# Patient Record
Sex: Female | Born: 1960 | Race: White | Hispanic: No | Marital: Married | Smoking: Never smoker
Health system: Southern US, Community
[De-identification: ages and names within clinical notes are randomized; demographics above are authoritative.]

## PROBLEM LIST (undated history)

## (undated) DIAGNOSIS — M549 Dorsalgia, unspecified: Secondary | ICD-10-CM

## (undated) DIAGNOSIS — Z973 Presence of spectacles and contact lenses: Secondary | ICD-10-CM

## (undated) DIAGNOSIS — N200 Calculus of kidney: Secondary | ICD-10-CM

## (undated) DIAGNOSIS — F419 Anxiety disorder, unspecified: Secondary | ICD-10-CM

## (undated) DIAGNOSIS — N201 Calculus of ureter: Secondary | ICD-10-CM

## (undated) DIAGNOSIS — Z8679 Personal history of other diseases of the circulatory system: Secondary | ICD-10-CM

## (undated) DIAGNOSIS — K219 Gastro-esophageal reflux disease without esophagitis: Secondary | ICD-10-CM

## (undated) DIAGNOSIS — M1990 Unspecified osteoarthritis, unspecified site: Secondary | ICD-10-CM

## (undated) DIAGNOSIS — M069 Rheumatoid arthritis, unspecified: Secondary | ICD-10-CM

## (undated) DIAGNOSIS — S52501A Unspecified fracture of the lower end of right radius, initial encounter for closed fracture: Secondary | ICD-10-CM

## (undated) DIAGNOSIS — G8929 Other chronic pain: Secondary | ICD-10-CM

## (undated) DIAGNOSIS — Z87442 Personal history of urinary calculi: Secondary | ICD-10-CM

## (undated) HISTORY — DX: Calculus of kidney: N20.0

## (undated) HISTORY — PX: WISDOM TOOTH EXTRACTION: SHX21

---

## 2000-03-18 ENCOUNTER — Other Ambulatory Visit: Admission: RE | Admit: 2000-03-18 | Discharge: 2000-03-18 | Payer: Self-pay | Admitting: Obstetrics and Gynecology

## 2001-07-02 ENCOUNTER — Other Ambulatory Visit: Admission: RE | Admit: 2001-07-02 | Discharge: 2001-07-02 | Payer: Self-pay | Admitting: Obstetrics and Gynecology

## 2001-11-24 ENCOUNTER — Ambulatory Visit (HOSPITAL_COMMUNITY): Admission: RE | Admit: 2001-11-24 | Discharge: 2001-11-24 | Payer: Self-pay | Admitting: Family Medicine

## 2001-11-24 ENCOUNTER — Encounter: Payer: Self-pay | Admitting: Family Medicine

## 2002-07-30 ENCOUNTER — Other Ambulatory Visit: Admission: RE | Admit: 2002-07-30 | Discharge: 2002-07-30 | Payer: Self-pay | Admitting: Obstetrics and Gynecology

## 2003-08-19 ENCOUNTER — Other Ambulatory Visit: Admission: RE | Admit: 2003-08-19 | Discharge: 2003-08-19 | Payer: Self-pay | Admitting: Obstetrics and Gynecology

## 2004-10-16 ENCOUNTER — Other Ambulatory Visit: Admission: RE | Admit: 2004-10-16 | Discharge: 2004-10-16 | Payer: Self-pay | Admitting: Obstetrics and Gynecology

## 2005-12-31 ENCOUNTER — Other Ambulatory Visit: Admission: RE | Admit: 2005-12-31 | Discharge: 2005-12-31 | Payer: Self-pay | Admitting: Obstetrics and Gynecology

## 2007-09-18 HISTORY — PX: KNEE ARTHROSCOPY: SUR90

## 2008-05-04 ENCOUNTER — Ambulatory Visit: Payer: Self-pay | Admitting: Family Medicine

## 2008-05-04 DIAGNOSIS — R109 Unspecified abdominal pain: Secondary | ICD-10-CM

## 2008-05-04 DIAGNOSIS — N200 Calculus of kidney: Secondary | ICD-10-CM

## 2008-05-04 LAB — CONVERTED CEMR LAB: Bacteria, UA: 0

## 2008-05-06 ENCOUNTER — Telehealth (INDEPENDENT_AMBULATORY_CARE_PROVIDER_SITE_OTHER): Payer: Self-pay | Admitting: Internal Medicine

## 2008-05-07 ENCOUNTER — Observation Stay (HOSPITAL_COMMUNITY): Admission: EM | Admit: 2008-05-07 | Discharge: 2008-05-08 | Payer: Self-pay | Admitting: Emergency Medicine

## 2008-05-10 ENCOUNTER — Observation Stay (HOSPITAL_COMMUNITY): Admission: EM | Admit: 2008-05-10 | Discharge: 2008-05-11 | Payer: Self-pay | Admitting: Emergency Medicine

## 2008-05-10 ENCOUNTER — Ambulatory Visit (HOSPITAL_COMMUNITY): Admission: RE | Admit: 2008-05-10 | Discharge: 2008-05-10 | Payer: Self-pay | Admitting: Urology

## 2008-05-10 HISTORY — PX: EXTRACORPOREAL SHOCK WAVE LITHOTRIPSY: SHX1557

## 2008-05-10 HISTORY — PX: OTHER SURGICAL HISTORY: SHX169

## 2010-09-17 HISTORY — PX: COLONOSCOPY: SHX174

## 2010-10-12 ENCOUNTER — Ambulatory Visit (HOSPITAL_COMMUNITY)
Admission: RE | Admit: 2010-10-12 | Discharge: 2010-10-12 | Payer: Self-pay | Source: Home / Self Care | Attending: Obstetrics and Gynecology | Admitting: Obstetrics and Gynecology

## 2011-01-30 NOTE — Op Note (Signed)
NAMEKERRYN, TENNANT              ACCOUNT NO.:  [id number]   MEDICAL RECORD NO.:  [id number]          PATIENT TYPE:  INP   LOCATION:  0098                         FACILITY:  [facility]   PHYSICIAN:  Excell Seltzer. Annabell Howells, M.D.    DATE OF BIRTH:  [birth year]   DATE OF PROCEDURE:  05/10/2008  DATE OF DISCHARGE:                               OPERATIVE REPORT   PROCEDURE:  Cystoscopy and insertion of right double-J stent.   PREOPERATIVE DIAGNOSIS:  A right proximal ureteral stone with  obstructing fragment post lithotripsy with fever.   POSTOPERATIVE DIAGNOSIS:  A right proximal ureteral stone with  obstructing fragment post lithotripsy with fever.   SURGEON:  Excell Seltzer. Annabell Howells, MD.   ANESTHESIA:  General.   DRAIN:  A 6-French x 26 mm double-J stent.   COMPLICATIONS:  None.   INDICATIONS:  Ms. Minchew is a 50 year old white female, who underwent  lithotripsy earlier today for a 6 mm right proximal ureteral stone.  Postoperatively, she developed severe right flank pain with a fever of  100.8 despite perioperative Cipro.  She got relief from Dilaudid in the  emergency room, but a CT scan to rule out hematoma demonstrated marked  hydronephrosis with an obstructing proximal fragment and after reviewing  the options we elected to proceed with placement of a stent particularly  because she had had the fever earlier, although she was afebrile upon  arrival to the emergency room.   FINDINGS/PROCEDURE:  The patient was taken to the operating room after  receiving Cipro.  She was placed in lithotomy position.  Her perineum  and genitalia were prepped with Betadine solution, and she was draped in  the usual sterile fashion.  Cystoscopy was performed using the 22-French  scope and 12-degree lens.  Examination revealed a normal urethra, the  bladder wall was unremarkable, the ureteral orifices were unremarkable.  There was a small stone fragment in the bladder suggesting at least  partial  fragmentation.   The right ureteral orifice was cannulated with a Sensor guidewire, which  was passed easily to the kidney.  Some bloody urine effluxed along the  bladder when it was passed.  A 6-French, 26-cm, double-J stent was then  passed through the kidney under fluoroscopic guidance.  Removal of the  wire produced a good coil in the kidney and a good coil in the bladder  with continued drainage of bloody turbid urine from the kidney.  The  bladder was then  drained, the cystoscope was removed, a B&O suppository was placed, the  patient was taken down from lithotomy position, her anesthetic was  reversed, and she was moved to the recovery room in stable condition.  There were no complications.      Excell Seltzer. Annabell Howells, M.D.  Electronically Signed     JJW/MEDQ  D:  05/10/2008  T:  05/11/2008  Job:  [id number]

## 2012-06-18 ENCOUNTER — Other Ambulatory Visit: Payer: Self-pay | Admitting: Obstetrics and Gynecology

## 2012-06-18 DIAGNOSIS — R928 Other abnormal and inconclusive findings on diagnostic imaging of breast: Secondary | ICD-10-CM

## 2012-06-18 DIAGNOSIS — N63 Unspecified lump in unspecified breast: Secondary | ICD-10-CM

## 2012-06-23 ENCOUNTER — Ambulatory Visit
Admission: RE | Admit: 2012-06-23 | Discharge: 2012-06-23 | Disposition: A | Discharge status: Home / Self Care | Payer: 59 | Source: Ambulatory Visit | Attending: Obstetrics and Gynecology | Admitting: Obstetrics and Gynecology

## 2012-06-23 DIAGNOSIS — N63 Unspecified lump in unspecified breast: Secondary | ICD-10-CM

## 2012-06-23 DIAGNOSIS — R928 Other abnormal and inconclusive findings on diagnostic imaging of breast: Secondary | ICD-10-CM

## 2013-07-30 ENCOUNTER — Ambulatory Visit: Payer: Self-pay | Admitting: Otolaryngology

## 2013-09-25 ENCOUNTER — Other Ambulatory Visit: Payer: Self-pay

## 2013-09-25 DIAGNOSIS — Z803 Family history of malignant neoplasm of breast: Secondary | ICD-10-CM

## 2013-09-25 DIAGNOSIS — Z1231 Encounter for screening mammogram for malignant neoplasm of breast: Secondary | ICD-10-CM

## 2013-10-15 ENCOUNTER — Ambulatory Visit
Admission: RE | Admit: 2013-10-15 | Discharge: 2013-10-15 | Disposition: A | Discharge status: Home / Self Care | Payer: Self-pay | Source: Ambulatory Visit

## 2013-10-15 DIAGNOSIS — Z1231 Encounter for screening mammogram for malignant neoplasm of breast: Secondary | ICD-10-CM

## 2013-10-15 DIAGNOSIS — Z803 Family history of malignant neoplasm of breast: Secondary | ICD-10-CM

## 2014-04-27 ENCOUNTER — Other Ambulatory Visit: Payer: Self-pay | Admitting: Urology

## 2014-05-26 ENCOUNTER — Encounter (HOSPITAL_COMMUNITY): Payer: Self-pay | Admitting: Pharmacy Technician

## 2014-06-01 ENCOUNTER — Encounter (HOSPITAL_BASED_OUTPATIENT_CLINIC_OR_DEPARTMENT_OTHER): Payer: Self-pay | Admitting: *Deleted

## 2014-06-07 ENCOUNTER — Encounter (HOSPITAL_BASED_OUTPATIENT_CLINIC_OR_DEPARTMENT_OTHER): Payer: Self-pay | Admitting: *Deleted

## 2014-06-07 ENCOUNTER — Encounter (HOSPITAL_COMMUNITY): Payer: Self-pay | Admitting: General Practice

## 2014-06-07 NOTE — Progress Notes (Signed)
NPO AFTER MN. ARRIVE AT 0900. NEEDS HG. WILL TAKE CYMBALTA AM DSO W/ SIPS OF WATER.

## 2014-06-09 ENCOUNTER — Ambulatory Visit (HOSPITAL_BASED_OUTPATIENT_CLINIC_OR_DEPARTMENT_OTHER): Payer: 59 | Admitting: Anesthesiology

## 2014-06-09 ENCOUNTER — Encounter (HOSPITAL_BASED_OUTPATIENT_CLINIC_OR_DEPARTMENT_OTHER): Payer: 59 | Admitting: Anesthesiology

## 2014-06-09 ENCOUNTER — Encounter (HOSPITAL_BASED_OUTPATIENT_CLINIC_OR_DEPARTMENT_OTHER)
Admission: RE | Disposition: A | Discharge status: Home / Self Care | Payer: Self-pay | Source: Ambulatory Visit | Attending: Urology

## 2014-06-09 ENCOUNTER — Ambulatory Visit (HOSPITAL_BASED_OUTPATIENT_CLINIC_OR_DEPARTMENT_OTHER)
Admission: RE | Admit: 2014-06-09 | Discharge: 2014-06-09 | Disposition: A | Discharge status: Home / Self Care | Payer: 59 | Source: Ambulatory Visit | Attending: Urology | Admitting: Urology

## 2014-06-09 ENCOUNTER — Encounter (HOSPITAL_BASED_OUTPATIENT_CLINIC_OR_DEPARTMENT_OTHER): Payer: Self-pay | Admitting: Anesthesiology

## 2014-06-09 DIAGNOSIS — R011 Cardiac murmur, unspecified: Secondary | ICD-10-CM | POA: Diagnosis not present

## 2014-06-09 DIAGNOSIS — M129 Arthropathy, unspecified: Secondary | ICD-10-CM | POA: Diagnosis not present

## 2014-06-09 DIAGNOSIS — R310 Gross hematuria: Secondary | ICD-10-CM | POA: Insufficient documentation

## 2014-06-09 DIAGNOSIS — Z841 Family history of disorders of kidney and ureter: Secondary | ICD-10-CM | POA: Insufficient documentation

## 2014-06-09 DIAGNOSIS — N200 Calculus of kidney: Secondary | ICD-10-CM | POA: Insufficient documentation

## 2014-06-09 HISTORY — DX: Anxiety disorder, unspecified: F41.9

## 2014-06-09 HISTORY — DX: Personal history of urinary calculi: Z87.442

## 2014-06-09 HISTORY — DX: Calculus of kidney: N20.0

## 2014-06-09 HISTORY — DX: Presence of spectacles and contact lenses: Z97.3

## 2014-06-09 HISTORY — DX: Gastro-esophageal reflux disease without esophagitis: K21.9

## 2014-06-09 HISTORY — PX: CYSTOSCOPY W/ URETERAL STENT PLACEMENT: SHX1429

## 2014-06-09 HISTORY — DX: Unspecified osteoarthritis, unspecified site: M19.90

## 2014-06-09 LAB — POCT HEMOGLOBIN-HEMACUE: Hemoglobin: 15 g/dL (ref 12.0–15.0)

## 2014-06-09 SURGERY — CYSTOSCOPY, WITH RETROGRADE PYELOGRAM AND URETERAL STENT INSERTION
Anesthesia: General | Site: Ureter | Laterality: Right

## 2014-06-09 MED ORDER — KETOROLAC TROMETHAMINE 30 MG/ML IJ SOLN
15.0000 mg | Freq: Once | INTRAMUSCULAR | Status: DC | PRN
  Filled 2014-06-09: qty 1

## 2014-06-09 MED ORDER — LACTATED RINGERS IV SOLN
INTRAVENOUS | Status: DC
Start: 2014-06-09 — End: 2014-06-09
  Administered 2014-06-09 (×2): via INTRAVENOUS
  Filled 2014-06-09: qty 1000

## 2014-06-09 MED ORDER — MIDAZOLAM HCL 5 MG/5ML IJ SOLN
INTRAMUSCULAR | Status: DC | PRN
  Administered 2014-06-09: 2 mg via INTRAVENOUS

## 2014-06-09 MED ORDER — FENTANYL CITRATE 0.05 MG/ML IJ SOLN
INTRAMUSCULAR | Status: DC | PRN
  Administered 2014-06-09: 50 ug via INTRAVENOUS
  Administered 2014-06-09 (×3): 25 ug via INTRAVENOUS

## 2014-06-09 MED ORDER — DEXAMETHASONE SODIUM PHOSPHATE 4 MG/ML IJ SOLN
INTRAMUSCULAR | Status: DC | PRN
  Administered 2014-06-09: 8 mg via INTRAVENOUS

## 2014-06-09 MED ORDER — FENTANYL CITRATE 0.05 MG/ML IJ SOLN
INTRAMUSCULAR | Status: AC
  Filled 2014-06-09: qty 2

## 2014-06-09 MED ORDER — SODIUM CHLORIDE 0.9 % IR SOLN
Status: DC | PRN
  Administered 2014-06-09: 3000 mL

## 2014-06-09 MED ORDER — FENTANYL CITRATE 0.05 MG/ML IJ SOLN
INTRAMUSCULAR | Status: AC
  Filled 2014-06-09: qty 4

## 2014-06-09 MED ORDER — LIDOCAINE HCL 2 % EX GEL
CUTANEOUS | Status: DC | PRN
  Administered 2014-06-09: 1 via URETHRAL

## 2014-06-09 MED ORDER — LACTATED RINGERS IV SOLN: INTRAVENOUS | Status: DC | PRN

## 2014-06-09 MED ORDER — OXYCODONE-ACETAMINOPHEN 5-325 MG PO TABS
ORAL_TABLET | ORAL | Status: AC
  Filled 2014-06-09: qty 1

## 2014-06-09 MED ORDER — ONDANSETRON HCL 4 MG/2ML IJ SOLN
INTRAMUSCULAR | Status: DC | PRN
  Administered 2014-06-09: 4 mg via INTRAVENOUS

## 2014-06-09 MED ORDER — FENTANYL CITRATE 0.05 MG/ML IJ SOLN
25.0000 ug | INTRAMUSCULAR | Status: DC | PRN
  Administered 2014-06-09: 50 ug via INTRAVENOUS
  Filled 2014-06-09: qty 1

## 2014-06-09 MED ORDER — PROMETHAZINE HCL 25 MG/ML IJ SOLN
6.2500 mg | INTRAMUSCULAR | Status: DC | PRN
  Filled 2014-06-09: qty 1

## 2014-06-09 MED ORDER — CEFAZOLIN SODIUM-DEXTROSE 2-3 GM-% IV SOLR
2.0000 g | INTRAVENOUS | Status: AC
  Administered 2014-06-09: 2 g via INTRAVENOUS
  Filled 2014-06-09: qty 50

## 2014-06-09 MED ORDER — PROPOFOL 10 MG/ML IV BOLUS
INTRAVENOUS | Status: DC | PRN
  Administered 2014-06-09: 200 mg via INTRAVENOUS

## 2014-06-09 MED ORDER — IOHEXOL 350 MG/ML SOLN
INTRAVENOUS | Status: DC | PRN
Start: 2014-06-09 — End: 2014-06-09
  Administered 2014-06-09: 9 mL via URETHRAL

## 2014-06-09 MED ORDER — OXYCODONE-ACETAMINOPHEN 5-325 MG PO TABS
1.0000 | ORAL_TABLET | ORAL | Status: DC | PRN
  Administered 2014-06-09: 1 via ORAL
  Filled 2014-06-09: qty 2

## 2014-06-09 MED ORDER — LIDOCAINE HCL (CARDIAC) 20 MG/ML IV SOLN
INTRAVENOUS | Status: DC | PRN
  Administered 2014-06-09: 50 mg via INTRAVENOUS

## 2014-06-09 MED ORDER — KETOROLAC TROMETHAMINE 30 MG/ML IJ SOLN
INTRAMUSCULAR | Status: DC | PRN
  Administered 2014-06-09: 30 mg via INTRAVENOUS

## 2014-06-09 MED ORDER — MIDAZOLAM HCL 2 MG/2ML IJ SOLN
INTRAMUSCULAR | Status: AC
  Filled 2014-06-09: qty 2

## 2014-06-09 SURGICAL SUPPLY — 30 items
ADAPTER CATH URET PLST 4-6FR (CATHETERS) IMPLANT
ADPR CATH URET STRL DISP 4-6FR (CATHETERS)
APL SKNCLS STERI-STRIP NONHPOA (GAUZE/BANDAGES/DRESSINGS)
BAG DRAIN URO-CYSTO SKYTR STRL (DRAIN) ×3 IMPLANT
BAG DRN UROCATH (DRAIN) ×1
BENZOIN TINCTURE PRP APPL 2/3 (GAUZE/BANDAGES/DRESSINGS) IMPLANT
CANISTER SUCT LVC 12 LTR MEDI- (MISCELLANEOUS) ×2 IMPLANT
CATH INTERMIT  6FR 70CM (CATHETERS) ×2 IMPLANT
CATH URET 5FR 28IN CONE TIP (BALLOONS)
CATH URET 5FR 28IN OPEN ENDED (CATHETERS) IMPLANT
CATH URET 5FR 70CM CONE TIP (BALLOONS) IMPLANT
CLOTH BEACON ORANGE TIMEOUT ST (SAFETY) ×3 IMPLANT
DRAPE CAMERA CLOSED 9X96 (DRAPES) ×3 IMPLANT
DRSG TEGADERM 2-3/8X2-3/4 SM (GAUZE/BANDAGES/DRESSINGS) IMPLANT
GLOVE BIO SURGEON STRL SZ7.5 (GLOVE) ×3 IMPLANT
GLOVE SURG SS PI 7.5 STRL IVOR (GLOVE) ×4 IMPLANT
GOWN BRE IMP SLV AUR LG STRL (GOWN DISPOSABLE) ×4 IMPLANT
GOWN STRL REIN XL XLG (GOWN DISPOSABLE) ×1 IMPLANT
GUIDEWIRE 0.038 PTFE COATED (WIRE) IMPLANT
GUIDEWIRE ANG ZIPWIRE 038X150 (WIRE) IMPLANT
GUIDEWIRE STR DUAL SENSOR (WIRE) ×2 IMPLANT
KIT BALLIN UROMAX 15FX10 (LABEL) IMPLANT
KIT BALLN UROMAX 15FX4 (MISCELLANEOUS) IMPLANT
KIT BALLN UROMAX 26 75X4 (MISCELLANEOUS)
NS IRRIG 500ML POUR BTL (IV SOLUTION) IMPLANT
PACK CYSTO (CUSTOM PROCEDURE TRAY) ×3 IMPLANT
SET HIGH PRES BAL DIL (LABEL)
SHEATH URET ACCESS 12FR/35CM (UROLOGICAL SUPPLIES) IMPLANT
SHEATH URET ACCESS 12FR/55CM (UROLOGICAL SUPPLIES) IMPLANT
STENT URET 6FRX24 CONTOUR (STENTS) ×2 IMPLANT

## 2014-06-09 NOTE — Anesthesia Preprocedure Evaluation (Signed)
Anesthesia Evaluation  Patient identified by MRN, date of birth, ID band Patient awake    Reviewed: Allergy & Precautions, H&P , NPO status , Patient's Chart, lab work & pertinent test results  Airway Mallampati: II TM Distance: >3 FB Neck ROM: Full    Dental no notable dental hx.    Pulmonary neg pulmonary ROS,  breath sounds clear to auscultation  Pulmonary exam normal       Cardiovascular negative cardio ROS  Rhythm:Regular Rate:Normal     Neuro/Psych Depression negative neurological ROS     GI/Hepatic negative GI ROS, Neg liver ROS,   Endo/Other  negative endocrine ROS  Renal/GU negative Renal ROS  negative genitourinary   Musculoskeletal negative musculoskeletal ROS (+)   Abdominal   Peds negative pediatric ROS (+)  Hematology negative hematology ROS (+)   Anesthesia Other Findings   Reproductive/Obstetrics negative OB ROS                           Anesthesia Physical Anesthesia Plan  ASA: II  Anesthesia Plan: General   Post-op Pain Management:    Induction: Intravenous  Airway Management Planned: LMA  Additional Equipment:   Intra-op Plan:   Post-operative Plan:   Informed Consent: I have reviewed the patients History and Physical, chart, labs and discussed the procedure including the risks, benefits and alternatives for the proposed anesthesia with the patient or authorized representative who has indicated his/her understanding and acceptance.   Dental advisory given  Plan Discussed with: CRNA and Surgeon  Anesthesia Plan Comments:         Anesthesia Quick Evaluation

## 2014-06-09 NOTE — Anesthesia Procedure Notes (Signed)
Procedure Name: LMA Insertion Date/Time: 06/09/2014 12:45 PM Performed by: Renella Cunas D Pre-anesthesia Checklist: Patient identified, Emergency Drugs available, Suction available and Patient being monitored Patient Re-evaluated:Patient Re-evaluated prior to inductionOxygen Delivery Method: Circle System Utilized Preoxygenation: Pre-oxygenation with 100% oxygen Intubation Type: IV induction Ventilation: Mask ventilation without difficulty LMA: LMA inserted LMA Size: 4.0 Number of attempts: 1 Airway Equipment and Method: bite block Placement Confirmation: positive ETCO2 Tube secured with: Tape Dental Injury: Teeth and Oropharynx as per pre-operative assessment

## 2014-06-09 NOTE — Interval H&P Note (Signed)
History and Physical Interval Note:  06/09/2014 12:40 PM  Courtney Kramer  has presented today for surgery, with the diagnosis of RIGHT RENAL CALCULUS  The various methods of treatment have been discussed with the patient and family. After consideration of risks, benefits and other options for treatment, the patient has consented to  Procedure(s): CYSTOSCOPY WITH RETROGRADE PYELOGRAM/URETERAL STENT PLACEMENT (Right) as a surgical intervention .  The patient's history has been reviewed, patient examined, no change in status, stable for surgery.  I have reviewed the patient's chart and labs.  Questions were answered to the patient's satisfaction.     Violia Knopf S

## 2014-06-09 NOTE — Anesthesia Postprocedure Evaluation (Signed)
  Anesthesia Post-op Note  Patient: Courtney Kramer  Procedure(s) Performed: Procedure(s) (LRB): CYSTOSCOPY WITH RETROGRADE PYELOGRAM/URETERAL STENT PLACEMENT (Right)  Patient Location: PACU  Anesthesia Type: General  Level of Consciousness: awake and alert   Airway and Oxygen Therapy: Patient Spontanous Breathing  Post-op Pain: mild  Post-op Assessment: Post-op Vital signs reviewed, Patient's Cardiovascular Status Stable, Respiratory Function Stable, Patent Airway and No signs of Nausea or vomiting  Last Vitals:  Filed Vitals:   06/09/14 1445  BP: 146/81  Pulse: 71  Temp: 36.8 C  Resp: 16    Post-op Vital Signs: stable   Complications: No apparent anesthesia complications

## 2014-06-09 NOTE — H&P (Signed)
History of Present Illness   Courtney Kramer presents today to reestablish as a new patient in our office. We saw her on several occasions 4-6 years ago with an episode of nephrolithiasis. The patient did require lithotripsy in 2009 for a right renal calculus. She was felt to have a small residual 1 mm fragment. After her lithotripsy, she did require a fairly urgent stent placement secondary to some obstructing fragments. The stent was subsequently removed and her situation improved. She also had a small pulmonary nodule that was further assessed and found to be stable. She then did quite well and has not been back to see [place]. For several months now she has had some intermittent right-sided flank and abdominal discomfort. She has also had multiple episodes of her urine being pink. It does appear that she has had some intermittent pain and gross hematuria. She has had no recent imaging studies of her abdomen. Her last episode of pain occurred about a week ago. Her urinalysis today is actually clear.     Past Medical History Problems  1. History of arthritis (V13.4) 2. History of Murmur (785.2) 3. Nephrolithiasis (592.0)  Surgical History Problems  1. History of Knee Surgery 2. History of Lithotripsy  Current Meds 1. No Reported Medications Recorded  Allergies Medication  1. No Known Drug Allergies  Family History Problems  1. Family history of kidney stones (V18.69) : Sibling  Social History Problems    Denied: History of Alcohol Use   Caffeine Use   1- sometimes   Marital History - Currently Married   Never a smoker   Occupation:   hairdresser   Denied: History of Tobacco Use  Review of Systems Genitourinary, constitutional, skin, eye, otolaryngeal, hematologic/lymphatic, cardiovascular, pulmonary, endocrine, musculoskeletal, gastrointestinal, neurological and psychiatric system(s) were reviewed and pertinent findings if present are noted.  Genitourinary: nocturia and  hematuria.  Constitutional: night sweats and feeling tired (fatigue).    Vitals  Height: 5 ft 6 in Weight: 181 lb  BMI Calculated: 29.21 BSA Calculated: 1.92 Blood Pressure: 118 / 74 Temperature: 97 F Heart Rate: 77  Physical Exam Constitutional: Well nourished and well developed . No acute distress.  ENT:. The ears and nose are normal in appearance.  Neck: The appearance of the neck is normal and no neck mass is present.  Pulmonary: No respiratory distress and normal respiratory rhythm and effort.  Cardiovascular: Heart rate and rhythm are normal . No peripheral edema.  Abdomen: The abdomen is soft and nontender. No masses are palpated. No CVA tenderness. No hernias are palpable. No hepatosplenomegaly noted.  Skin: Normal skin turgor, no visible rash and no visible skin lesions.  Neuro/Psych:. Mood and affect are appropriate.    Results/Data Urine  COLOR YELLOW  APPEARANCE CLOUDY  SPECIFIC GRAVITY 1.010  pH 7.5  GLUCOSE NEG mg/dL BILIRUBIN NEG  KETONE NEG mg/dL BLOOD NEG  PROTEIN NEG mg/dL UROBILINOGEN 0.2 mg/dL NITRITE NEG  LEUKOCYTE ESTERASE NEG  SQUAMOUS EPITHELIAL/HPF RARE  WBC NONE SEEN WBC/hpf RBC 0-2 RBC/hpf BACTERIA NONE SEEN  CRYSTALS NONE SEEN  CASTS NONE SEEN  Selected Results  SPECIMEN TYPE: CLEAN CATCH  Test Name Result Flag Reference COLOR YELLOW  YELLOW APPEARANCE CLOUDY A CLEAR SPECIFIC GRAVITY 1.010  1.005-1.030 pH 7.5  5.0-8.0 GLUCOSE NEG mg/dL  NEG BILIRUBIN NEG  NEG KETONE NEG mg/dL  NEG BLOOD NEG  NEG PROTEIN NEG mg/dL  NEG UROBILINOGEN 0.2 mg/dL  3.7-1.0 NITRITE NEG  NEG LEUKOCYTE ESTERASE NEG  NEG SQUAMOUS EPITHELIAL/HPF RARE  RARE  WBC NONE SEEN WBC/hpf  <3 RBC 0-2 RBC/hpf  <3 BACTERIA NONE SEEN  RARE CRYSTALS NONE SEEN  NONE SEEN CASTS NONE SEEN  NONE SEEN  AU CT-STONE PROTOCOL 10Aug2015 12:00AM Courtney Kramer  Test Name Result Flag Reference CT-STONE PROTOCOL (Report)   ** RADIOLOGY REPORT BY [facility] **    CLINICAL DATA: Hematuria with right low back and right lower quadrant pain.  EXAM: CT ABDOMEN AND PELVIS WITHOUT CONTRAST (URINARY CALCULUS PROTOCOL)  TECHNIQUE: Multidetector CT imaging was performed through the abdomen and pelvis without intravenous contrast to include the urinary tract.  COMPARISON: 05/10/2008.  FINDINGS: Lung bases show a 4 mm nodule in the left lower lobe, unchanged and therefore benign. Heart size normal. No pericardial or pleural effusion.  Liver, gallbladder and adrenal glands are unremarkable. Stones are seen in the kidneys bilaterally, right greater than left, measuring up to 9 mm on the right. Ureters are decompressed bilaterally. Spleen, pancreas, stomach, small bowel and appendix are unremarkable. A fair amount of stool is seen in the colon. Uterus and ovaries are visualized. No free fluid. No pathologically enlarged lymph nodes. Scattered atherosclerotic calcification of the arterial vasculature without abdominal aortic aneurysm. No worrisome lytic or sclerotic lesions.  IMPRESSION: 1. No acute findings to explain the patient's given symptoms. 2. Right renal stones. 3. Probable constipation.   Electronically Signed  By: Courtney Kramer M.D.  On: 04/26/2014 15:58  Assessment Assessed  1. Gross hematuria (599.71) 2. Nephrolithiasis (592.0)  Plan Health Maintenance  1. UA With REFLEX; [Do Not Release]; Status:Complete;   Done: 10Aug2015 02:46PM Nephrolithiasis  2. AU CT-STONE PROTOCOL; Status:Complete;   Done: 10Aug2015 12:00AM  Discussion/Summary   Courtney Kramer has had some nonspecific right-sided back and abdominal pain. CT today shows a 9 mm stone in her right kidney. It is currently nonobstructing but could be potentially obstructing a portion of a calyx. She has also had some intermittent hematuria and that is likely to be secondary to the stone. Clearly, this is a substantial change from the 1 mm stone she had 4 years ago. She does have a  couple of tiny stones also in that right kidney, but the significant stone is the single 9 mm stone. I do think this would be a stone that we would typically recommend treatment given her metabolic activity and the fact that she is having some intermittent hematuria. Last time she had ESWL, she had obstructing fragments and required an urgent stent. I cannot rule out that possibility of occurring again. She does have an option of having a stent placed prior to litho, but has may indeed get away without a stent this time, so that is ultimately her decision. She is told that typically in a situation like this, we would not pre-place the stent and would do lithotripsy and then see how things go. Hounsfield units on the stone today were around 900.   cc: Tomi Bamberger, NP

## 2014-06-09 NOTE — Discharge Instructions (Addendum)
Cystoscopy patient instructions  Following a cystoscopy, a catheter (a flexible rubber tube) is sometimes left in place to empty the bladder. This may cause some discomfort or a feeling that you need to urinate. Your doctor determines the period of time that the catheter will be left in place. You may have bloody urine for two to three days (Call your doctor if the amount of bleeding increases or does not subside).  You may pass blood clots in your urine, especially if you had a biopsy. It is not unusual to pass small blood clots and have some bloody urine a couple of weeks after your cystoscopy. Again, call your doctor if the bleeding does not subside. You may have: Dysuria (painful urination) Frequency (urinating often) Urgency (strong desire to urinate)  These symptoms are common especially if medicine is instilled into the bladder or a ureteral stent is placed. Avoiding alcohol and caffeine, such as coffee, tea, and chocolate, may help relieve these symptoms. Drink plenty of water, unless otherwise instructed. Your doctor may also prescribe an antibiotic or other medicine to reduce these symptoms.  Cystoscopy results are available soon after the procedure; biopsy results usually take two to four days. Your doctor will discuss the results of your exam with you. Before you go home, you will be given specific instructions for follow-up care. Special Instructions:  1 If you are going home with a catheter in place do not take a tub bath until removed by your doctor.  2 You may resume your normal activities.  3 Do not drive or operate machinery if you are taking narcotic pain medicine.  4 Be sure to keep all follow-up appointments with your doctor.   5 Call Your Doctor If: The catheter is not draining  You have severe pain  You are unable to urinate  You have a fever over 101  You have severe bleeding           keep appointment for tomorrow's ESWL   Post Anesthesia Home Care  Instructions  Activity: Get plenty of rest for the remainder of the day. A responsible adult should stay with you for 24 hours following the procedure.  For the next 24 hours, DO NOT: -Drive a car -Advertising copywriter -Drink alcoholic beverages -Take any medication unless instructed by your physician -Make any legal decisions or sign important papers.  Meals: Start with liquid foods such as gelatin or soup. Progress to regular foods as tolerated. Avoid greasy, spicy, heavy foods. If nausea and/or vomiting occur, drink only clear liquids until the nausea and/or vomiting subsides. Call your physician if vomiting continues.  Special Instructions/Symptoms: Your throat may feel dry or sore from the anesthesia or the breathing tube placed in your throat during surgery. If this causes discomfort, gargle with warm salt water. The discomfort should disappear within 24 hours.

## 2014-06-09 NOTE — Transfer of Care (Signed)
Immediate Anesthesia Transfer of Care Note  Patient: Courtney Kramer  Procedure(s) Performed: Procedure(s) (LRB): CYSTOSCOPY WITH RETROGRADE PYELOGRAM/URETERAL STENT PLACEMENT (Right)  Patient Location: PACU  Anesthesia Type: General  Level of Consciousness: awake, oriented, sedated and patient cooperative  Airway & Oxygen Therapy: Patient Spontanous Breathing and Patient connected to face mask oxygen  Post-op Assessment: Report given to PACU RN and Post -op Vital signs reviewed and stable  Post vital signs: Reviewed and stable  Complications: No apparent anesthesia complications

## 2014-06-09 NOTE — Op Note (Signed)
Preoperative diagnosis: Right renal calculus Postoperative diagnosis: Same  Procedure: Cystoscopy, right retrograde pyelogram with fluoroscopic interpretation, right double-J stent placement 6 French x24 cm   Surgeon: Valetta Fuller M.D.  Anesthesia: Gen.  Indications: Patient has a history of nephrolithiasis. She recently presented for further assessment of some intermittent right flank pain. She is also had some intermittent gross hematuria. CT scan showed multiple bilateral nonobstructing renal stones. The largest stone at 9 mm on the right side was noted in a lower pole calyx we thought that stone might be intermittently obstructing her calyx and/or resulting hematuria. She elected to proceed with definitive treatment of the stone. We suggested ESWL but she developed obstructing fragments last time and required an urgent stent. She requested pre-ESWL stent placement.     Technique and findings: Patient was brought the operating room where she had successful induction general anesthesia. She was placed in lithotomy position and prepped and draped in usual manner. Appropriate surgical timeout was performed. Cystoscopy revealed unremarkable urethra and bladder.   The right ureteral orifice was cannulated with an open-ended catheter. Retrograde pyelogram was done with fluoroscopic interpretation. The ureter was out filling defects or obstruction. A stone could be appreciated in the lower pole the right kidney. There was no evidence of hydronephrosis.   Guidewire was placed in the right renal pelvis. The cervix French 24 cm double-J stent was placed with fluoroscopic as well as visual guidance. No obvious complications occurred. Patient was brought to recovery room stable condition.

## 2014-06-09 NOTE — Anesthesia Postprocedure Evaluation (Deleted)
  Anesthesia Post-op Note  Patient: Courtney Kramer  Procedure(s) Performed: Procedure(s) (LRB): CYSTOSCOPY WITH RETROGRADE PYELOGRAM/URETERAL STENT PLACEMENT (Right)  Patient Location: PACU  Anesthesia Type: General  Level of Consciousness: awake and alert   Airway and Oxygen Therapy: Patient Spontanous Breathing  Post-op Pain: mild  Post-op Assessment: Post-op Vital signs reviewed, Patient's Cardiovascular Status Stable, Respiratory Function Stable, Patent Airway and No signs of Nausea or vomiting  Last Vitals:  Filed Vitals:   06/09/14 1445  BP: 146/81  Pulse: 71  Temp: 36.8 C  Resp: 16    Post-op Vital Signs: stable   Complications: No apparent anesthesia complications  

## 2014-06-10 ENCOUNTER — Other Ambulatory Visit: Payer: Self-pay | Admitting: Urology

## 2014-06-10 ENCOUNTER — Encounter (HOSPITAL_BASED_OUTPATIENT_CLINIC_OR_DEPARTMENT_OTHER): Payer: Self-pay | Admitting: Urology

## 2014-06-10 ENCOUNTER — Encounter (HOSPITAL_COMMUNITY): Payer: Self-pay | Admitting: Anesthesiology

## 2014-06-10 ENCOUNTER — Ambulatory Visit (HOSPITAL_COMMUNITY): Admission: RE | Admit: 2014-06-10 | Payer: 59 | Source: Ambulatory Visit | Admitting: Urology

## 2014-06-10 SURGERY — LITHOTRIPSY, ESWL
Anesthesia: General | Laterality: Right

## 2014-06-11 ENCOUNTER — Encounter (HOSPITAL_COMMUNITY): Payer: Self-pay | Admitting: *Deleted

## 2014-06-14 ENCOUNTER — Ambulatory Visit (HOSPITAL_COMMUNITY)
Admission: RE | Admit: 2014-06-14 | Discharge: 2014-06-14 | Disposition: A | Discharge status: Home / Self Care | Payer: 59 | Source: Ambulatory Visit | Attending: Urology | Admitting: Urology

## 2014-06-14 ENCOUNTER — Encounter (HOSPITAL_COMMUNITY): Payer: Self-pay | Admitting: *Deleted

## 2014-06-14 ENCOUNTER — Ambulatory Visit (HOSPITAL_COMMUNITY): Payer: 59

## 2014-06-14 ENCOUNTER — Encounter (HOSPITAL_COMMUNITY)
Admission: RE | Disposition: A | Discharge status: Home / Self Care | Payer: Self-pay | Source: Ambulatory Visit | Attending: Urology

## 2014-06-14 DIAGNOSIS — Z841 Family history of disorders of kidney and ureter: Secondary | ICD-10-CM | POA: Diagnosis not present

## 2014-06-14 DIAGNOSIS — M129 Arthropathy, unspecified: Secondary | ICD-10-CM | POA: Diagnosis not present

## 2014-06-14 DIAGNOSIS — N200 Calculus of kidney: Secondary | ICD-10-CM

## 2014-06-14 DIAGNOSIS — R310 Gross hematuria: Secondary | ICD-10-CM | POA: Insufficient documentation

## 2014-06-14 SURGERY — LITHOTRIPSY, ESWL
Anesthesia: LOCAL | Laterality: Right

## 2014-06-14 MED ORDER — DEXTROSE-NACL 5-0.45 % IV SOLN
INTRAVENOUS | Status: DC
  Administered 2014-06-14: 17:00:00 via INTRAVENOUS

## 2014-06-14 MED ORDER — CIPROFLOXACIN HCL 500 MG PO TABS
500.0000 mg | ORAL_TABLET | ORAL | Status: AC
  Administered 2014-06-14: 500 mg via ORAL
  Filled 2014-06-14: qty 1

## 2014-06-14 MED ORDER — DIAZEPAM 5 MG PO TABS
10.0000 mg | ORAL_TABLET | ORAL | Status: AC
  Administered 2014-06-14: 10 mg via ORAL
  Filled 2014-06-14: qty 2

## 2014-06-14 MED ORDER — TAMSULOSIN HCL 0.4 MG PO CAPS: 0.4000 mg | ORAL_CAPSULE | Freq: Every day | ORAL | Status: DC

## 2014-06-14 MED ORDER — DIPHENHYDRAMINE HCL 25 MG PO CAPS
25.0000 mg | ORAL_CAPSULE | ORAL | Status: AC
  Administered 2014-06-14: 25 mg via ORAL
  Filled 2014-06-14: qty 1

## 2014-06-14 MED ORDER — TROSPIUM CHLORIDE ER 60 MG PO CP24: 60.0000 mg | ORAL_CAPSULE | Freq: Every day | ORAL | Status: DC

## 2014-06-14 MED ORDER — PHENAZOPYRIDINE HCL 200 MG PO TABS: 200.0000 mg | ORAL_TABLET | Freq: Three times a day (TID) | ORAL | Status: DC | PRN

## 2014-06-14 NOTE — Discharge Instructions (Signed)
See [facility] discharge instructions in chart.  

## 2014-06-14 NOTE — Op Note (Signed)
See [facility] OP note scanned into chart. 

## 2014-06-16 NOTE — H&P (Signed)
History of Present Illness   Courtney Kramer presents today to reestablish as a new patient in our office. We saw her on several occasions 4-6 years ago with an episode of nephrolithiasis. The patient did require lithotripsy in 2009 for a right renal calculus. She was felt to have a small residual 1 mm fragment. After her lithotripsy, she did require a fairly urgent stent placement secondary to some obstructing fragments. The stent was subsequently removed and her situation improved. She also had a small pulmonary nodule that was further assessed and found to be stable. She then did quite well and has not been back to see [place]. For several months now she has had some intermittent right-sided flank and abdominal discomfort. She has also had multiple episodes of her urine being pink. It does appear that she has had some intermittent pain and gross hematuria. She has had no recent imaging studies of her abdomen. Her last episode of pain occurred about a week ago. Her urinalysis today is actually clear.     Past Medical History Problems  1. History of arthritis (V13.4) 2. History of Murmur (785.2) 3. Nephrolithiasis (592.0)  Surgical History Problems  1. History of Knee Surgery 2. History of Lithotripsy  Current Meds 1. No Reported Medications Recorded  Allergies Medication  1. No Known Drug Allergies  Family History Problems  1. Family history of kidney stones (V18.69) : Sibling  Social History Problems    Denied: History of Alcohol Use   Caffeine Use   1- sometimes   Marital History - Currently Married   Never a smoker   Occupation:   hairdresser   Denied: History of Tobacco Use  Review of Systems Genitourinary, constitutional, skin, eye, otolaryngeal, hematologic/lymphatic, cardiovascular, pulmonary, endocrine, musculoskeletal, gastrointestinal, neurological and psychiatric system(s) were reviewed and pertinent findings if present are noted.  Genitourinary: nocturia and  hematuria.  Constitutional: night sweats and feeling tired (fatigue).    Vitals Vital Signs [Data Includes: Last 1 Day]  Recorded: 10Aug2015 03:08PM  Height: 5 ft 6 in Weight: 181 lb  BMI Calculated: 29.21 BSA Calculated: 1.92 Blood Pressure: 118 / 74 Temperature: 97 F Heart Rate: 77  Physical Exam Constitutional: Well nourished and well developed . No acute distress.  ENT:. The ears and nose are normal in appearance.  Neck: The appearance of the neck is normal and no neck mass is present.  Pulmonary: No respiratory distress and normal respiratory rhythm and effort.  Cardiovascular: Heart rate and rhythm are normal . No peripheral edema.  Abdomen: The abdomen is soft and nontender. No masses are palpated. No CVA tenderness. No hernias are palpable. No hepatosplenomegaly noted.  Skin: Normal skin turgor, no visible rash and no visible skin lesions.  Neuro/Psych:. Mood and affect are appropriate.    Results/Data Urine [Data Includes: Last 1 Day]   10Aug2015  COLOR YELLOW   APPEARANCE CLOUDY   SPECIFIC GRAVITY 1.010   pH 7.5   GLUCOSE NEG mg/dL  BILIRUBIN NEG   KETONE NEG mg/dL  BLOOD NEG   PROTEIN NEG mg/dL  UROBILINOGEN 0.2 mg/dL  NITRITE NEG   LEUKOCYTE ESTERASE NEG   SQUAMOUS EPITHELIAL/HPF RARE   WBC NONE SEEN WBC/hpf  RBC 0-2 RBC/hpf  BACTERIA NONE SEEN   CRYSTALS NONE SEEN   CASTS NONE SEEN   Selected Results  UA With REFLEX 10Aug2015 02:46PM Courtney Kramer  SPECIMEN TYPE: CLEAN CATCH   Test Name Result Flag Reference  COLOR YELLOW  YELLOW  APPEARANCE CLOUDY A CLEAR  SPECIFIC GRAVITY  1.010  1.005-1.030  pH 7.5  5.0-8.0  GLUCOSE NEG mg/dL  NEG  BILIRUBIN NEG  NEG  KETONE NEG mg/dL  NEG  BLOOD NEG  NEG  PROTEIN NEG mg/dL  NEG  UROBILINOGEN 0.2 mg/dL  3.2-9.1  NITRITE NEG  NEG  LEUKOCYTE ESTERASE NEG  NEG  SQUAMOUS EPITHELIAL/HPF RARE  RARE  WBC NONE SEEN WBC/hpf  <3  RBC 0-2 RBC/hpf  <3  BACTERIA NONE SEEN  RARE  CRYSTALS NONE SEEN  NONE SEEN  CASTS  NONE SEEN  NONE SEEN   AU CT-STONE PROTOCOL 10Aug2015 12:00AM Courtney Kramer   Test Name Result Flag Reference  CT-STONE PROTOCOL (Report)    ** RADIOLOGY REPORT BY [facility] **   CLINICAL DATA: Hematuria with right low back and right lower quadrant pain.  EXAM: CT ABDOMEN AND PELVIS WITHOUT CONTRAST (URINARY CALCULUS PROTOCOL)  TECHNIQUE: Multidetector CT imaging was performed through the abdomen and pelvis without intravenous contrast to include the urinary tract.  COMPARISON: 05/10/2008.  FINDINGS: Lung bases show a 4 mm nodule in the left lower lobe, unchanged and therefore benign. Heart size normal. No pericardial or pleural effusion.  Liver, gallbladder and adrenal glands are unremarkable. Stones are seen in the kidneys bilaterally, right greater than left, measuring up to 9 mm on the right. Ureters are decompressed bilaterally. Spleen, pancreas, stomach, small bowel and appendix are unremarkable. A fair amount of stool is seen in the colon. Uterus and ovaries are visualized. No free fluid. No pathologically enlarged lymph nodes. Scattered atherosclerotic calcification of the arterial vasculature without abdominal aortic aneurysm. No worrisome lytic or sclerotic lesions.  IMPRESSION: 1. No acute findings to explain the patient's given symptoms. 2. Right renal stones. 3. Probable constipation.   Electronically Signed  By: Leanna Battles M.D.  On: 04/26/2014 15:58   Assessment Assessed  1. Gross hematuria (599.71) 2. Nephrolithiasis (592.0)  Plan Health Maintenance  1. UA With REFLEX; [Do Not Release]; Status:Complete;   Done: 10Aug2015 02:46PM Nephrolithiasis  2. AU CT-STONE PROTOCOL; Status:Complete;   Done: 10Aug2015 12:00AM  Discussion/Summary   Courtney Kramer has had some nonspecific right-sided back and abdominal pain. CT today shows a 9 mm stone in her right kidney. It is currently nonobstructing but could be potentially obstructing a portion  of a calyx. She has also had some intermittent hematuria and that is likely to be secondary to the stone. Clearly, this is a substantial change from the 1 mm stone she had 4 years ago. She does have a couple of tiny stones also in that right kidney, but the significant stone is the single 9 mm stone. I do think this would be a stone that we would typically recommend treatment given her metabolic activity and the fact that she is having some intermittent hematuria. Last time she had ESWL, she had obstructing fragments and required an urgent stent. I cannot rule out that possibility of occurring again. She does have an option of having a stent placed prior to litho, but has may indeed get away without a stent this time, so that is ultimately her decision. She is told that typically in a situation like this, we would not pre-place the stent and would do lithotripsy and then see how things go. Hounsfield units on the stone today were around 900.

## 2014-11-17 IMAGING — MG MM SCREENING BREAST TOMO BILATATERAL
9 of 14 series · 9 of 38 positions shown · non-contrast
Comparison: Previous exam(s).

CLINICAL DATA: Screening.

EXAM:
DIGITAL SCREENING BILATERAL MAMMOGRAM WITH 3D TOMO WITH CAD
DIGITAL BREAST TOMOSYNTHESIS
Digital breast tomosynthesis images are acquired in two projections.
These images are reviewed in combination with the digital mammogram,
confirming the findings below.

[L MLO (1 of 2)]
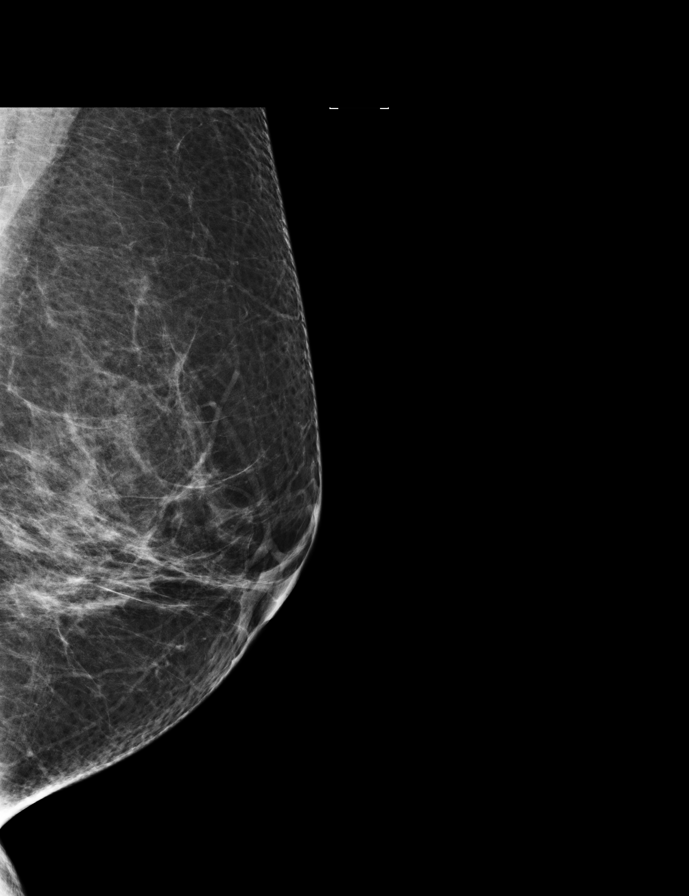

[R CC (1 of 2)]
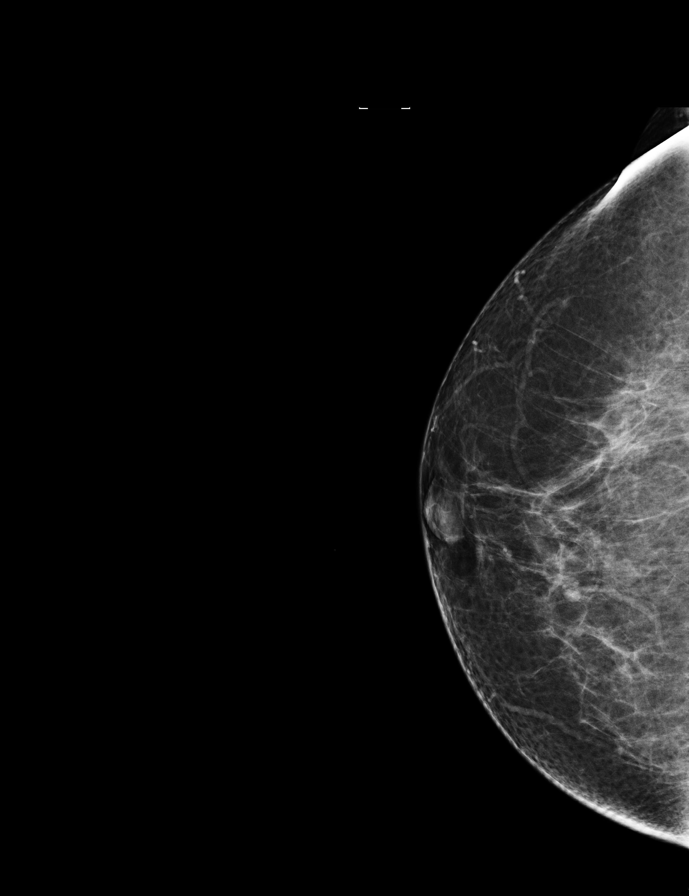

[L CC]
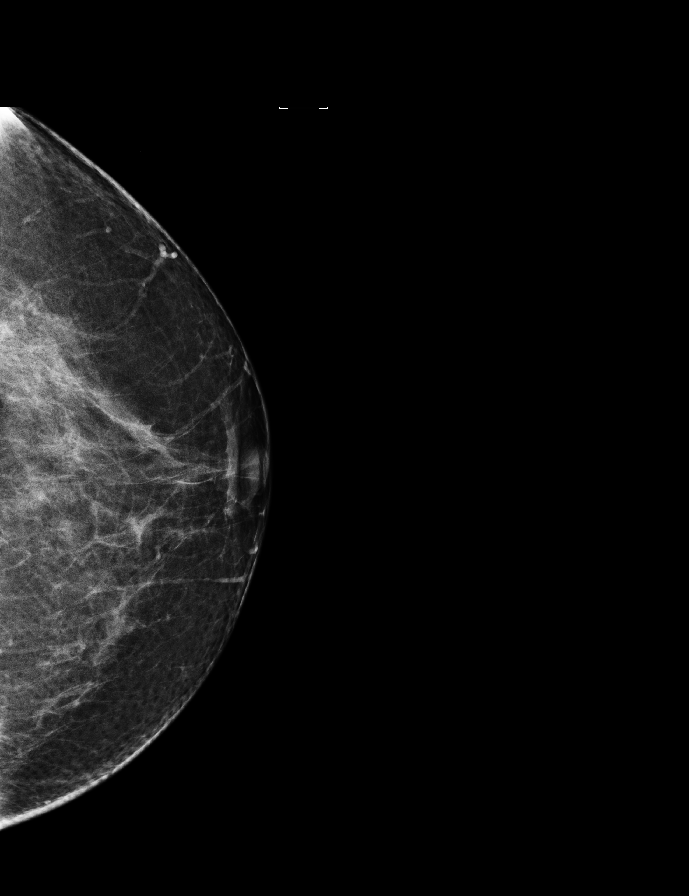

[L MLO (2 of 2)]
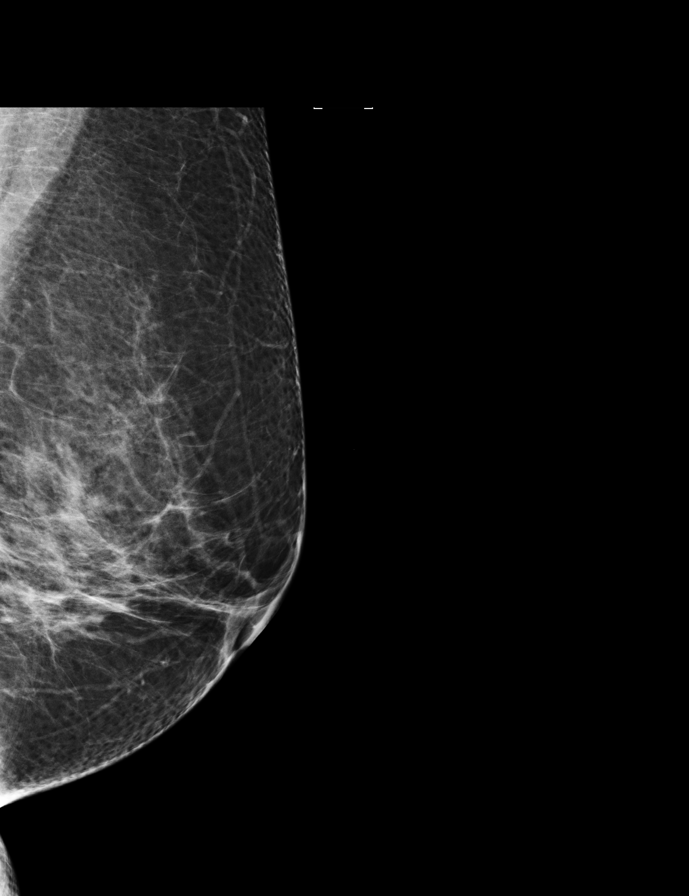

[R MLO (1 of 2)]
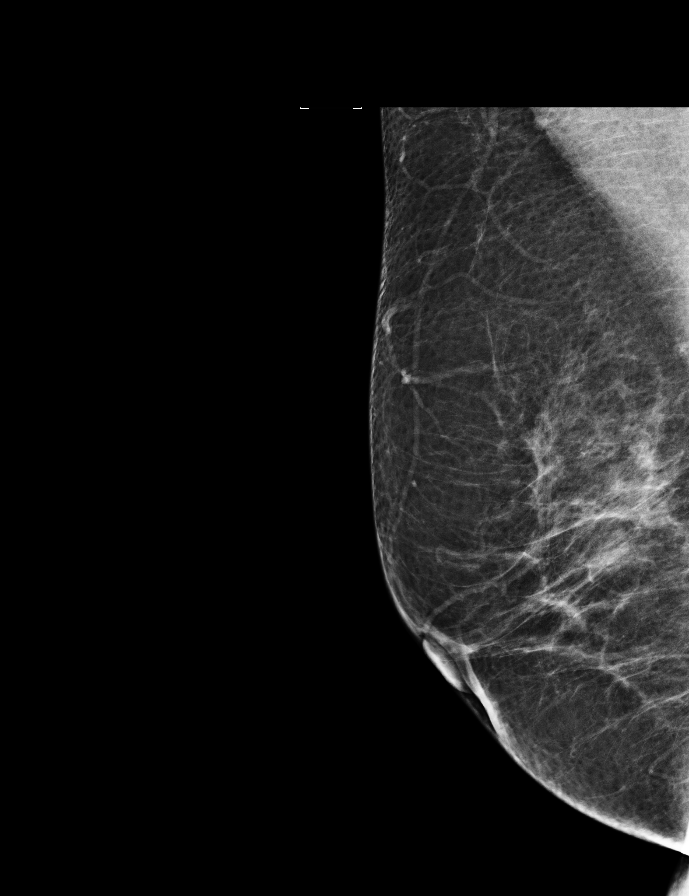

[R MLO (2 of 2)]
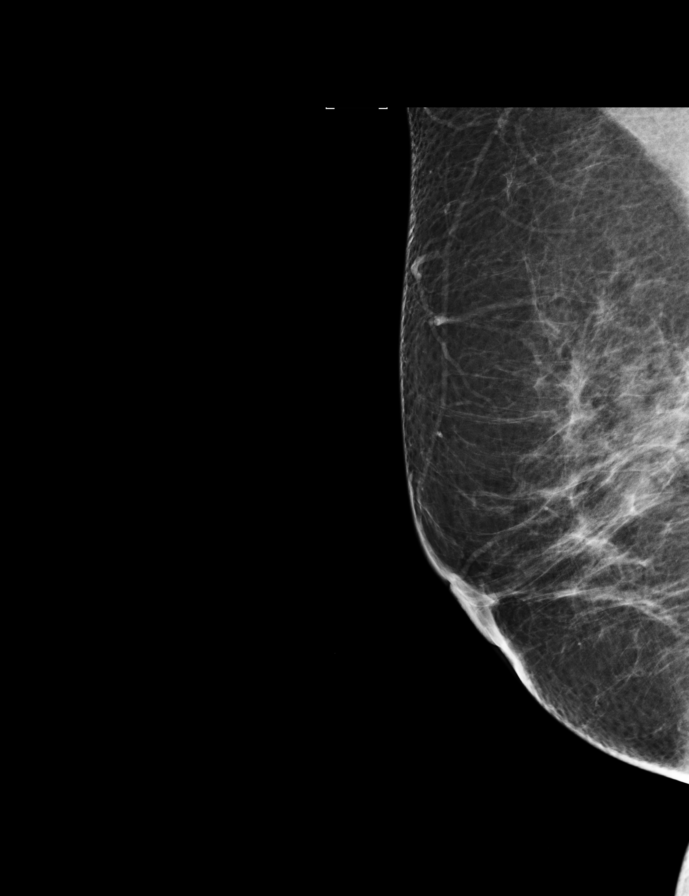

[R CC (2 of 2)]
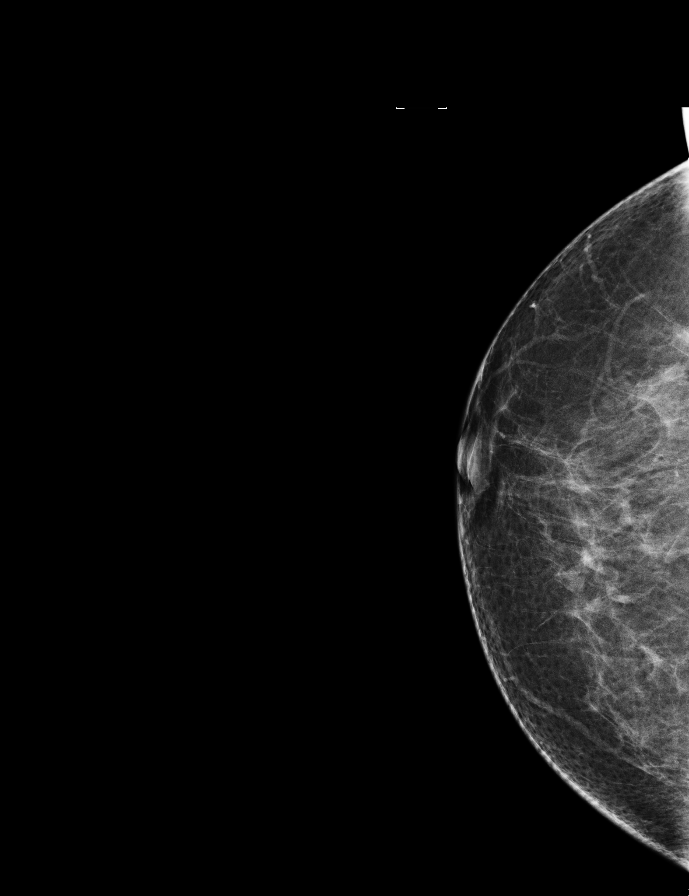

[R CC tomo · tomo slice 47/92.0]
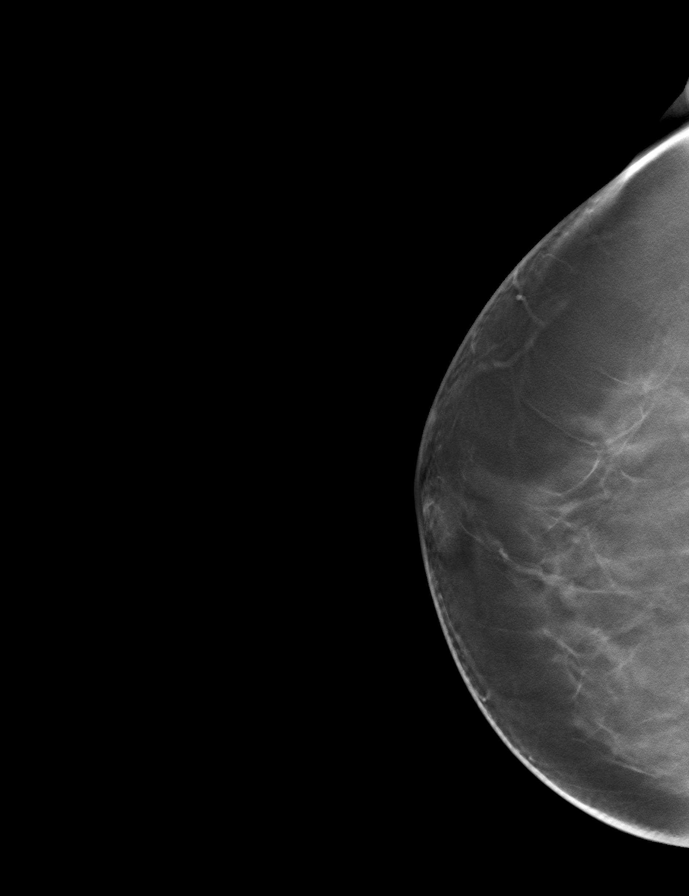

[R MLO synth-2D]
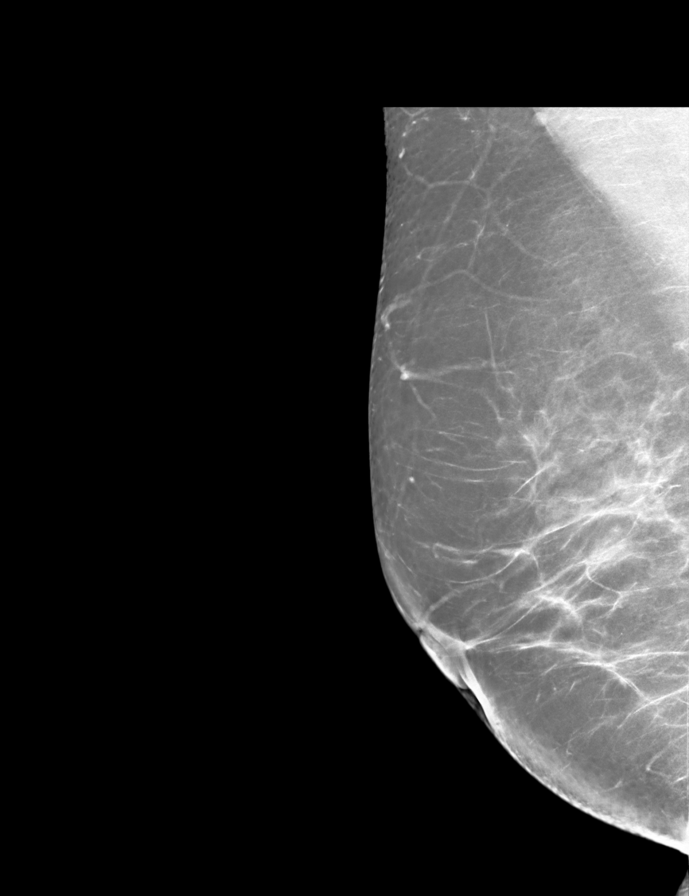

[9 of 38 positions shown; findings below may reference images not displayed]

ACR Breast Density Category b: There are scattered areas of
fibroglandular density.
FINDINGS: There are no findings suspicious for malignancy. Images were
processed with CAD.
IMPRESSION: No mammographic evidence of malignancy. A result letter of this
screening mammogram will be mailed directly to the patient.

RECOMMENDATION:
Screening mammogram in one year. (Code:LC-5-VFV)

BI-RADS CATEGORY  1: Negative.

## 2015-03-11 ENCOUNTER — Other Ambulatory Visit: Payer: Self-pay

## 2015-03-11 DIAGNOSIS — Z1231 Encounter for screening mammogram for malignant neoplasm of breast: Secondary | ICD-10-CM

## 2015-03-14 ENCOUNTER — Ambulatory Visit
Admission: RE | Admit: 2015-03-14 | Discharge: 2015-03-14 | Disposition: A | Discharge status: Home / Self Care | Payer: BLUE CROSS/BLUE SHIELD | Source: Ambulatory Visit

## 2015-03-14 DIAGNOSIS — Z1231 Encounter for screening mammogram for malignant neoplasm of breast: Secondary | ICD-10-CM

## 2016-04-17 ENCOUNTER — Other Ambulatory Visit: Payer: Self-pay | Admitting: Obstetrics and Gynecology

## 2016-04-17 DIAGNOSIS — Z1231 Encounter for screening mammogram for malignant neoplasm of breast: Secondary | ICD-10-CM

## 2016-04-19 ENCOUNTER — Ambulatory Visit
Admission: RE | Admit: 2016-04-19 | Discharge: 2016-04-19 | Disposition: A | Discharge status: Home / Self Care | Payer: BLUE CROSS/BLUE SHIELD | Source: Ambulatory Visit | Attending: Obstetrics and Gynecology | Admitting: Obstetrics and Gynecology

## 2016-04-19 DIAGNOSIS — Z1231 Encounter for screening mammogram for malignant neoplasm of breast: Secondary | ICD-10-CM

## 2016-07-31 ENCOUNTER — Ambulatory Visit (INDEPENDENT_AMBULATORY_CARE_PROVIDER_SITE_OTHER): Payer: BLUE CROSS/BLUE SHIELD

## 2016-07-31 ENCOUNTER — Ambulatory Visit (INDEPENDENT_AMBULATORY_CARE_PROVIDER_SITE_OTHER): Payer: BLUE CROSS/BLUE SHIELD | Admitting: Sports Medicine

## 2016-07-31 ENCOUNTER — Encounter (INDEPENDENT_AMBULATORY_CARE_PROVIDER_SITE_OTHER): Payer: Self-pay | Admitting: Sports Medicine

## 2016-07-31 VITALS — BP 119/74 | HR 78 | Ht 66.0 in | Wt 180.0 lb

## 2016-07-31 DIAGNOSIS — M5441 Lumbago with sciatica, right side: Secondary | ICD-10-CM

## 2016-07-31 MED ORDER — GABAPENTIN 300 MG PO CAPS: ORAL_CAPSULE | ORAL | 1 refills | Status: DC

## 2016-07-31 MED ORDER — METHYLPREDNISOLONE 4 MG PO TBPK: ORAL_TABLET | Freq: Every morning | ORAL | 0 refills | Status: DC

## 2016-07-31 NOTE — Progress Notes (Signed)
Courtney Kramer - 55 y.o. female MRN [id number]  Date of birth: 06/16/1961  Office Visit Note: Visit Date: 07/31/2016 PCP: Tomi Bamberger, NP Referred by: Tomi Bamberger, NP  Subjective: Chief Complaint  Patient presents with  . Lower Back - New Patient (Initial Visit)  . Follow-up    Patient states back goes out sometimes, but has never went out this bad.  Hurts to walk, sit, or stand.  Pain does radiate into right leg, some numbness and tingling in right leg.   HPI: Acute onset of severe low back & right leg pain with no eliciting event. She's had issues with her back intermittently over the past several years but never to this extent. She was having a hard time sitting, standing, laying. She had a hard time getting comfortable in that are what position she is in. Her symptoms have gradually improving over the past several days but are still bothersome for her. No changes in bowel or bladder. No fevers, chills recent weight gain or weight loss. Medications at home have not been helpful including over-the-counter anti-inflammatories.  She does have a history of renal calculi but reports is feeling significantly different than that & no colicky-type component to her current symptoms.    ROS Otherwise per HPI.  Assessment & Plan: Visit Diagnoses:  1. Acute right-sided low back pain with right-sided sciatica     Plan: Findings:  Symptomatic treatment. No red flag symptoms on exam today. If any lack of improvement consider further advanced imaging with lumbar spine MRI. AAOS spine conditioning program provided, psoas stretching emphasized.   Meds & Orders:  Meds ordered this encounter  Medications  . gabapentin (NEURONTIN) 300 MG capsule    Sig: Start with 1 tab po qhs X 1 week, then increase to 1 tab po bid X 1 week then 1 tab po tid prn    Dispense:  90 capsule    Refill:  1  . methylPREDNISolone (MEDROL DOSEPAK) 4 MG TBPK tablet    Sig: Take by mouth AC breakfast.    Dispense:   21 tablet    Refill:  0    Orders Placed This Encounter  Procedures  . XR Lumbar Spine 2-3 Views    Follow-up: Return if symptoms worsen or fail to improve, for She will call for any worsening symptoms & we can consider MRI at that time..   Procedures: No procedures performed  No notes on file   Clinical History: No specialty comments available.  She reports that she has never smoked. She has never used smokeless tobacco. No results for input(s): HGBA1C, LABURIC in the last 8760 hours.  Objective:  VS:  HT:5\' 6"  (167.6 cm)   WT:180 lb (81.6 kg)  BMI:29.1    BP:119/74  HR:78bpm  TEMP: ( )  RESP:  Physical Exam  Ortho Exam  Adult female. No acute respiratory distress. Alert & appropriately interactive but in a mild amount of discomfort. Bilateral negative straight leg raises however she does have some exacerbation of her pain with psoas stretching. She has pain with popliteal compression test & greater sciatic notch tenderness. She is able to heel & toe walk without difficulty. Lower extremity reflexes are symmetrically normal. No significant lower extremity edema. No focal midline back pain or tenderness. Imaging: Xr Lumbar Spine 2-3 Views  Result Date: 08/02/2016 Findings: 2V lumbar spine: Significant facet arthrosis with overall well aligned lumbar spine. Some loss of joint space between L2/L3. Minimal loss between L5/S1. 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 RENAL CALCULUS 05/04/2008  . FLANK PAIN, RIGHT 05/04/2008   Past Medical History:  Diagnosis Date  . Anxiety   . Arthritis   . Depression   . History of kidney stones   . Renal calculus, right   . Wears glasses    No family history on file. Past Surgical History:  Procedure Laterality Date  . COLONOSCOPY  2012  . CYSTO/  RIGHT URETERAL  STENT PLACEMENT  05-10-2008  . CYSTOSCOPY W/ URETERAL STENT PLACEMENT Right 06/09/2014   Procedure: CYSTOSCOPY WITH RETROGRADE PYELOGRAM/URETERAL STENT PLACEMENT;  Surgeon: Valetta Fuller, MD;  Location: [facility];  Service: Urology;  Laterality: Right;  . EXTRACORPOREAL SHOCK WAVE LITHOTRIPSY Right 05-10-2008  . KNEE ARTHROSCOPY Left 2009  . WISDOM TOOTH EXTRACTION  AGE 89   Social History   Occupational History  . Not on file.   Social History Main Topics  . Smoking status: Never Smoker  . Smokeless tobacco: Never Used  . Alcohol use Yes     Comment: OCCASONAL  . Drug use: No  . Sexual activity: Not on file

## 2016-08-03 ENCOUNTER — Telehealth (INDEPENDENT_AMBULATORY_CARE_PROVIDER_SITE_OTHER): Payer: Self-pay | Admitting: Sports Medicine

## 2016-08-03 DIAGNOSIS — M5410 Radiculopathy, site unspecified: Secondary | ICD-10-CM

## 2016-08-03 NOTE — Telephone Encounter (Signed)
Patient calling because the Gabapentin and prednisone is not working for lower back.  She is experiencing numbness in the R leg and foot.  She states she is continuing to work and is not able to keep elevated during those hours, but does so after she is home. Please call and advise.

## 2016-08-03 NOTE — Telephone Encounter (Signed)
See message concerning patient Rx's not working. Please Advise. Thank You

## 2016-08-06 ENCOUNTER — Telehealth (INDEPENDENT_AMBULATORY_CARE_PROVIDER_SITE_OTHER): Payer: Self-pay | Admitting: Sports Medicine

## 2016-08-06 NOTE — Telephone Encounter (Signed)
Please call and inform pt that we are ordering an MRI of her back.  She will need to follow up after this is obtained. April is out today.Marland KitchenMarland Kitchen

## 2016-08-06 NOTE — Telephone Encounter (Signed)
Courtney Kramer patient

## 2016-08-09 ENCOUNTER — Other Ambulatory Visit (INDEPENDENT_AMBULATORY_CARE_PROVIDER_SITE_OTHER): Payer: Self-pay | Admitting: Orthopaedic Surgery

## 2016-08-14 ENCOUNTER — Telehealth (INDEPENDENT_AMBULATORY_CARE_PROVIDER_SITE_OTHER): Payer: Self-pay | Admitting: Sports Medicine

## 2016-08-14 NOTE — Telephone Encounter (Signed)
Patient would like to know the status of the MRI.  She had a deep tissue massage and it did help with the numbness and tingling in the R leg , but she would like to try the prednisone at least until she can do the MRI. She has questions regarding the MRI.  Pt uses [facility].

## 2016-08-15 MED ORDER — TRAMADOL HCL 50 MG PO TABS: 50.0000 mg | ORAL_TABLET | Freq: Four times a day (QID) | ORAL | 0 refills | Status: DC | PRN

## 2016-08-15 NOTE — Telephone Encounter (Signed)
I sent a message to [facility] for her to check on status of patients MRI being scheduled. She is requesting a prescription for prednisone in meantime while she is waiting for her scan. Can you please advise about medication?

## 2016-08-15 NOTE — Telephone Encounter (Signed)
Do you know anything about the status of patients MRI?

## 2016-08-15 NOTE — Telephone Encounter (Signed)
Please see other note

## 2016-08-15 NOTE — Telephone Encounter (Signed)
She is Courtney Kramer had a Medrol Dosepak & we cannot continue her on prednisone at this time. Isn't sure that she filled the prescription for the Medrol. Is also located: Tramadol for her if she would like but no other escalation of therapy at this time.

## 2016-08-16 NOTE — Telephone Encounter (Signed)
I called patient to advise that additional steroid pack would not be appropriate it is too early. Continue with pursuit of MRI, she is going to call them this morning. And than she will follow up post scan. She is feeling better and also taking her gabapentin.

## 2016-08-16 NOTE — Telephone Encounter (Signed)
I s/w Pt and informed her that her MRI is in the process of being scheduled and that she should be hearing from [place] [facility] to schedule, I also gave pt number to call and contact imaging to schedule

## 2016-08-29 ENCOUNTER — Ambulatory Visit
Admission: RE | Admit: 2016-08-29 | Discharge: 2016-08-29 | Disposition: A | Discharge status: Home / Self Care | Payer: BLUE CROSS/BLUE SHIELD | Source: Ambulatory Visit | Attending: Sports Medicine | Admitting: Sports Medicine

## 2016-08-29 DIAGNOSIS — M5410 Radiculopathy, site unspecified: Secondary | ICD-10-CM

## 2016-08-31 ENCOUNTER — Telehealth (INDEPENDENT_AMBULATORY_CARE_PROVIDER_SITE_OTHER): Payer: Self-pay | Admitting: *Deleted

## 2016-08-31 DIAGNOSIS — M519 Unspecified thoracic, thoracolumbar and lumbosacral intervertebral disc disorder: Secondary | ICD-10-CM

## 2016-08-31 NOTE — Telephone Encounter (Signed)
Spoke with patient. Reviewed results briefly with her. She would like to be referred for epidural steroid injection & an order was placed for this.   I Will ask for Dr. Alvester Morin follow her up 6 weeks post injection since I'm changing clinics.

## 2016-08-31 NOTE — Telephone Encounter (Signed)
-  Talked with patient and advised her that a follow up appointment is usually made to come in and go over results.  Patient stated that she would like to have a phone call for MRI results.  [phone]

## 2016-08-31 NOTE — Telephone Encounter (Signed)
Pt called asking for MRI results

## 2016-09-19 ENCOUNTER — Encounter (INDEPENDENT_AMBULATORY_CARE_PROVIDER_SITE_OTHER): Payer: Self-pay | Admitting: Physical Medicine and Rehabilitation

## 2016-09-19 ENCOUNTER — Ambulatory Visit (INDEPENDENT_AMBULATORY_CARE_PROVIDER_SITE_OTHER): Payer: BLUE CROSS/BLUE SHIELD | Admitting: Physical Medicine and Rehabilitation

## 2016-09-19 VITALS — BP 142/77 | HR 91 | Temp 97.8°F

## 2016-09-19 DIAGNOSIS — M5116 Intervertebral disc disorders with radiculopathy, lumbar region: Secondary | ICD-10-CM | POA: Diagnosis not present

## 2016-09-19 DIAGNOSIS — M5416 Radiculopathy, lumbar region: Secondary | ICD-10-CM | POA: Diagnosis not present

## 2016-09-19 MED ORDER — METHYLPREDNISOLONE ACETATE 80 MG/ML IJ SUSP
80.0000 mg | Freq: Once | INTRAMUSCULAR | Status: AC
  Administered 2016-09-19: 80 mg

## 2016-09-19 MED ORDER — LIDOCAINE HCL (PF) 1 % IJ SOLN: 0.3300 mL | Freq: Once | INTRAMUSCULAR | Status: DC

## 2016-09-19 NOTE — Procedures (Signed)
Lumbosacral Transforaminal Epidural Steroid Injection - Infraneural Approach with Fluoroscopic Guidance  Patient: Courtney Kramer      Date of Birth: 1961/01/19 MRN: [id number] PCP: Tomi Bamberger, NP      Visit Date: 09/19/2016   Universal Protocol:    Date/Time: 01/03/183:00 PM  Consent Given By: the patient  Position: PRONE   Additional Comments: Vital signs were monitored before and after the procedure. Patient was prepped and draped in the usual sterile fashion. The correct patient, procedure, and site was verified.   Injection Procedure Details:  Procedure Site One Meds Administered:  Meds ordered this encounter  Medications  . lidocaine (PF) (XYLOCAINE) 1 % injection 0.3 mL  . methylPREDNISolone acetate (DEPO-MEDROL) injection 80 mg      Laterality: Right  Location/Site:  L4-L5  Needle size: 22 G  Needle type: Spinal  Needle Placement: Transforaminal  Findings:  -Contrast Used: 2 mL iohexol 180 mg iodine/mL   -Comments: Excellent flow of contrast along the nerve and into the epidural space.  Procedure Details: After squaring off the end-plates of the desired vertebral level to get a true AP view, the C-arm was obliqued to the painful side so that the superior articulating process is positioned about 1/3 the length of the inferior endplate.  The needle was aimed toward the junction of the superior articular process and the transverse process of the inferior vertebrae. The needle's initial entry is in the lower third of the foramen through Kambin's triangle. The soft tissues overlying this target were infiltrated with 2-3 ml. of 1% Lidocaine without Epinephrine.  The spinal needle was then inserted and advanced toward the target using a "trajectory" view along the fluoroscope beam.  Under AP and lateral visualization, the needle was advanced so it did not puncture dura and did not traverse medially beyond the 6 o'clock position of the pedicle. Bi-planar projections  were used to confirm position. Aspiration was confirmed to be negative for CSF and/or blood. A 1-2 ml. volume of Isovue-250 was injected and flow of contrast was noted at each level. Radiographs were obtained for documentation purposes.   After attaining the desired flow of contrast documented above, a 0.5 to 1.0 ml test dose of 0.25% Marcaine was injected into each respective transforaminal space.  The patient was observed for 90 seconds post injection.  After no sensory deficits were reported, and normal lower extremity motor function was noted,   the above injectate was administered so that equal amounts of the injectate were placed at each foramen (level) into the transforaminal epidural space.   Additional Comments:  The patient tolerated the procedure well Dressing: Band-Aid    Post-procedure details: Patient was observed during the procedure. Post-procedure instructions were reviewed.  Patient left the clinic in stable condition.

## 2016-09-19 NOTE — Progress Notes (Signed)
ROBBIE NANGLE - 56 y.o. female MRN [id number]  Date of birth: July 09, 1961  Office Visit Note: Visit Date: 09/19/2016 PCP: Tomi Bamberger, NP Referred by: Tomi Bamberger, NP  Subjective: Chief Complaint  Patient presents with  . Lower Back - Pain   HPI: Mrs. Maldonado is a 56 year old female with chronic low back pain but recent severe right side lower back pain. Was radiating down the leg to foot but states no longer radiating as of about a week. Now only having pain when she lifts. Watches her 2 infant grandchildren and has to do a lot of lifting. "nothing like it was". He did notice somewhat better since he saw Dr. Berline Chough she reports that any sort of forward flexion coughing or sneezing is really severe pain. She feels like it's on the verge of just going out on her she refers to it. She does get some referral into the buttocks but is not going down the leg to the ankle anymore. MRI of the lumbar spine showed disc herniation which we did show her on the images today. We are going to complete a diagnostic and hopefully therapeutic right L4 transforaminal injection today.    ROS Otherwise per HPI.  Assessment & Plan: Visit Diagnoses:  1. Lumbar radiculopathy   2. Radiculopathy due to lumbar intervertebral disc disorder     Plan: Findings:  Diagnostic and hopefully therapeutic right L4 transforaminal epidural steroid injection for right radicular pain in L3-for disc herniation extrusion. She'll follow-up with Dr. Berline Chough as needed. We did discuss natural history of these disc herniations and I think she'll probably get this to calm down and he'll do quite well.    Meds & Orders:  Meds ordered this encounter  Medications  . lidocaine (PF) (XYLOCAINE) 1 % injection 0.3 mL  . methylPREDNISolone acetate (DEPO-MEDROL) injection 80 mg    Orders Placed This Encounter  Procedures  . Epidural Steroid injection    Follow-up: Return if symptoms worsen or fail to improve, after 2 weeks.    Procedures: No procedures performed  Lumbosacral Transforaminal Epidural Steroid Injection - Infraneural Approach with Fluoroscopic Guidance  Patient: ADALIAH HIEGEL      Date of Birth: [birth year] MRN: [id number] PCP: Tomi Bamberger, NP      Visit Date: 09/19/2016   Universal Protocol:    Date/Time: 01/03/183:00 PM  Consent Given By: the patient  Position: PRONE   Additional Comments: Vital signs were monitored before and after the procedure. Patient was prepped and draped in the usual sterile fashion. The correct patient, procedure, and site was verified.   Injection Procedure Details:  Procedure Site One Meds Administered:  Meds ordered this encounter  Medications  . lidocaine (PF) (XYLOCAINE) 1 % injection 0.3 mL  . methylPREDNISolone acetate (DEPO-MEDROL) injection 80 mg      Laterality: Right  Location/Site:  L4-L5  Needle size: 22 G  Needle type: Spinal  Needle Placement: Transforaminal  Findings:  -Contrast Used: 2 mL iohexol 180 mg iodine/mL   -Comments: Excellent flow of contrast along the nerve and into the epidural space.  Procedure Details: After squaring off the end-plates of the desired vertebral level to get a true AP view, the C-arm was obliqued to the painful side so that the superior articulating process is positioned about 1/3 the length of the inferior endplate.  The needle was aimed toward the junction of the superior articular process and the transverse process of the inferior vertebrae. The needle's initial entry is in the lower third of  the foramen through Kambin's triangle. The soft tissues overlying this target were infiltrated with 2-3 ml. of 1% Lidocaine without Epinephrine.  The spinal needle was then inserted and advanced toward the target using a "trajectory" view along the fluoroscope beam.  Under AP and lateral visualization, the needle was advanced so it did not puncture dura and did not traverse medially beyond the 6 o'clock  position of the pedicle. Bi-planar projections were used to confirm position. Aspiration was confirmed to be negative for CSF and/or blood. A 1-2 ml. volume of Isovue-250 was injected and flow of contrast was noted at each level. Radiographs were obtained for documentation purposes.   After attaining the desired flow of contrast documented above, a 0.5 to 1.0 ml test dose of 0.25% Marcaine was injected into each respective transforaminal space.  The patient was observed for 90 seconds post injection.  After no sensory deficits were reported, and normal lower extremity motor function was noted,   the above injectate was administered so that equal amounts of the injectate were placed at each foramen (level) into the transforaminal epidural space.   Additional Comments:  The patient tolerated the procedure well Dressing: Band-Aid    Post-procedure details: Patient was observed during the procedure. Post-procedure instructions were reviewed.  Patient left the clinic in stable condition.     Clinical History: Lspine MRI 08/29/2016 IMPRESSION: 1. Symptomatic level favored to be L3-L4 where a small disc herniation affects the right lateral recess. Query right L4 radiculitis. Borderline to mild overall spinal stenosis at that level. 2. Mild for age lumbar spine degeneration elsewhere. No other convincing neural impingement.  She reports that she has never smoked. She has never used smokeless tobacco. No results for input(s): HGBA1C, LABURIC in the last 8760 hours.  Objective:  VS:  HT:    WT:   BMI:     BP:(!) 142/77  HR:91bpm  TEMP:97.8 F (36.6 C)(Oral)  RESP:99 % Physical Exam  Musculoskeletal:  The patient ambulates without aid. She is a little slow time arising from a seated position. She has negative slump test bilaterally with good strength distally. No clonus.    Ortho Exam Imaging: No results found.  Past Medical/Family/Surgical/Social History: Medications & Allergies  reviewed per EMR Patient Active Problem List   Diagnosis Date Noted  . Lumbar radiculopathy 09/19/2016  . RENAL CALCULUS 05/04/2008  . FLANK PAIN, RIGHT 05/04/2008   Past Medical History:  Diagnosis Date  . Anxiety   . Arthritis   . Depression   . History of kidney stones   . Renal calculus, right   . Wears glasses    History reviewed. No pertinent family history. Past Surgical History:  Procedure Laterality Date  . COLONOSCOPY  2012  . CYSTO/  RIGHT URETERAL STENT PLACEMENT  05-10-2008  . CYSTOSCOPY W/ URETERAL STENT PLACEMENT Right 06/09/2014   Procedure: CYSTOSCOPY WITH RETROGRADE PYELOGRAM/URETERAL STENT PLACEMENT;  Surgeon: Valetta Fuller, MD;  Location: [facility];  Service: Urology;  Laterality: Right;  . EXTRACORPOREAL SHOCK WAVE LITHOTRIPSY Right 05-10-2008  . KNEE ARTHROSCOPY Left 2009  . WISDOM TOOTH EXTRACTION  AGE 9   Social History   Occupational History  . Not on file.   Social History Main Topics  . Smoking status: Never Smoker  . Smokeless tobacco: Never Used  . Alcohol use Yes     Comment: OCCASONAL  . Drug use: No  . Sexual activity: Not on file

## 2016-09-19 NOTE — Patient Instructions (Signed)

## 2016-09-24 ENCOUNTER — Other Ambulatory Visit (INDEPENDENT_AMBULATORY_CARE_PROVIDER_SITE_OTHER): Payer: Self-pay | Admitting: Sports Medicine

## 2016-09-24 NOTE — Telephone Encounter (Signed)
Rx refill request

## 2018-03-24 ENCOUNTER — Encounter (INDEPENDENT_AMBULATORY_CARE_PROVIDER_SITE_OTHER): Payer: Self-pay | Admitting: Orthopaedic Surgery

## 2018-03-24 ENCOUNTER — Ambulatory Visit (INDEPENDENT_AMBULATORY_CARE_PROVIDER_SITE_OTHER): Payer: BLUE CROSS/BLUE SHIELD | Admitting: Orthopaedic Surgery

## 2018-03-24 ENCOUNTER — Ambulatory Visit (INDEPENDENT_AMBULATORY_CARE_PROVIDER_SITE_OTHER): Payer: Self-pay

## 2018-03-24 DIAGNOSIS — S52531A Colles' fracture of right radius, initial encounter for closed fracture: Secondary | ICD-10-CM

## 2018-03-24 MED ORDER — HYDROCODONE-ACETAMINOPHEN 5-325 MG PO TABS: 1.0000 | ORAL_TABLET | Freq: Two times a day (BID) | ORAL | 0 refills | Status: DC | PRN

## 2018-03-24 NOTE — Addendum Note (Signed)
Addended by: Mayra Reel on: 03/24/2018 10:21 AM   Modules accepted: Orders

## 2018-03-24 NOTE — Progress Notes (Addendum)
Office Visit Note   Patient: Courtney Kramer           Date of Birth: August 10, 1961           MRN: [id number] Visit Date: 03/24/2018              Requested by: No referring provider defined for this encounter. PCP: Dema Severin, NP   Assessment & Plan: Visit Diagnoses:  1. Closed Colles' fracture of right radius, initial encounter     Plan: Impression is a dorsally angulated intra-articular right distal radius fracture indicated for ORIF.  We discussed the surgery in detail including the risks and benefits and expected postoperative rehab and recovery.  Patient understands and wishes to proceed with surgery later this week.  Questions encouraged and answered. Total face to face encounter time was greater than 25 minutes and over half of this time was spent in counseling and/or coordination of care.  Follow-Up Instructions: Return in about 2 weeks (around 04/07/2018).   Orders:  Orders Placed This Encounter  Procedures  . XR Wrist Complete Right   Meds ordered this encounter  Medications  . HYDROcodone-acetaminophen (NORCO) 5-325 MG tablet    Sig: Take 1-2 tablets by mouth 2 (two) times daily as needed.    Dispense:  14 tablet    Refill:  0      Procedures: No procedures performed   Clinical Data: No additional findings.   Subjective: Chief Complaint  Patient presents with  . Right Wrist - Pain    Courtney Kramer is a 57 year old female that I previously took care of who comes in with a new injury to her right distal radius that she suffered from a mechanical [place].  She is right-hand dominant and works as a Interior and spatial designer.  She denies any numbness and tingling or signs or symptoms of carpal tunnel syndrome.  She states that she has swelling and pain is worse with use and movement of her fingers.   Review of Systems  Constitutional: Negative.   HENT: Negative.   Eyes: Negative.   Respiratory: Negative.   Cardiovascular: Negative.     Endocrine: Negative.   Musculoskeletal: Negative.   Neurological: Negative.   Hematological: Negative.   Psychiatric/Behavioral: Negative.   All other systems reviewed and are negative.    Objective: Vital Signs: There were no vitals taken for this visit.  Physical Exam  Constitutional: She is oriented to person, place, and time. She appears well-developed and well-nourished.  HENT:  Head: Normocephalic and atraumatic.  Eyes: EOM are normal.  Neck: Neck supple.  Pulmonary/Chest: Effort normal.  Abdominal: Soft.  Neurological: She is alert and oriented to person, place, and time.  Skin: Skin is warm. Capillary refill takes less than 2 seconds.  Psychiatric: She has a normal mood and affect. Her behavior is normal. Judgment and thought content normal.  Nursing note and vitals reviewed.   Ortho Exam Right wrist and hand exam shows no neurovascular compromise.  Strong radial pulse.  Fingers are warm and well-perfused.  Skin is intact. Specialty Comments:  No specialty comments available.  Imaging: Xr Wrist Complete Right  Result Date: 03/24/2018 Dorsally angulated intraarticular distal radius fracture.      PMFS History: Patient Active Problem List   Diagnosis Date Noted  . Lumbar radiculopathy 09/19/2016  . RENAL CALCULUS 05/04/2008  . FLANK PAIN, RIGHT 05/04/2008   Past Medical History:  Diagnosis Date  . Anxiety   . Arthritis   .  Depression   . History of kidney stones   . Renal calculus, right   . Wears glasses     History reviewed. No pertinent family history.  Past Surgical History:  Procedure Laterality Date  . COLONOSCOPY  2012  . CYSTO/  RIGHT URETERAL STENT PLACEMENT  05-10-2008  . CYSTOSCOPY W/ URETERAL STENT PLACEMENT Right 06/09/2014   Procedure: CYSTOSCOPY WITH RETROGRADE PYELOGRAM/URETERAL STENT PLACEMENT;  Surgeon: Valetta Fuller, MD;  Location: [facility];  Service: Urology;  Laterality: Right;  . EXTRACORPOREAL SHOCK WAVE  LITHOTRIPSY Right 05-10-2008  . KNEE ARTHROSCOPY Left 2009  . WISDOM TOOTH EXTRACTION  AGE 39   Social History   Occupational History  . Not on file  Tobacco Use  . Smoking status: Never Smoker  . Smokeless tobacco: Never Used  Substance and Sexual Activity  . Alcohol use: Yes    Comment: OCCASONAL  . Drug use: No  . Sexual activity: Not on file

## 2018-03-25 ENCOUNTER — Ambulatory Visit (INDEPENDENT_AMBULATORY_CARE_PROVIDER_SITE_OTHER): Payer: BLUE CROSS/BLUE SHIELD | Admitting: Orthopaedic Surgery

## 2018-03-25 ENCOUNTER — Encounter (HOSPITAL_BASED_OUTPATIENT_CLINIC_OR_DEPARTMENT_OTHER): Payer: Self-pay | Admitting: *Deleted

## 2018-03-25 ENCOUNTER — Other Ambulatory Visit: Payer: Self-pay

## 2018-03-27 ENCOUNTER — Ambulatory Visit (HOSPITAL_BASED_OUTPATIENT_CLINIC_OR_DEPARTMENT_OTHER): Payer: BLUE CROSS/BLUE SHIELD | Admitting: Anesthesiology

## 2018-03-27 ENCOUNTER — Encounter (HOSPITAL_BASED_OUTPATIENT_CLINIC_OR_DEPARTMENT_OTHER): Payer: Self-pay | Admitting: Anesthesiology

## 2018-03-27 ENCOUNTER — Encounter (HOSPITAL_BASED_OUTPATIENT_CLINIC_OR_DEPARTMENT_OTHER)
Admission: RE | Disposition: A | Discharge status: Home / Self Care | Payer: Self-pay | Source: Ambulatory Visit | Attending: Orthopaedic Surgery

## 2018-03-27 ENCOUNTER — Ambulatory Visit (HOSPITAL_BASED_OUTPATIENT_CLINIC_OR_DEPARTMENT_OTHER)
Admission: RE | Admit: 2018-03-27 | Discharge: 2018-03-27 | Disposition: A | Discharge status: Home / Self Care | Payer: BLUE CROSS/BLUE SHIELD | Source: Ambulatory Visit | Attending: Orthopaedic Surgery | Admitting: Orthopaedic Surgery

## 2018-03-27 ENCOUNTER — Other Ambulatory Visit: Payer: Self-pay

## 2018-03-27 DIAGNOSIS — S52571A Other intraarticular fracture of lower end of right radius, initial encounter for closed fracture: Secondary | ICD-10-CM | POA: Insufficient documentation

## 2018-03-27 DIAGNOSIS — W19XXXA Unspecified fall, initial encounter: Secondary | ICD-10-CM | POA: Insufficient documentation

## 2018-03-27 DIAGNOSIS — Z87442 Personal history of urinary calculi: Secondary | ICD-10-CM | POA: Insufficient documentation

## 2018-03-27 DIAGNOSIS — F419 Anxiety disorder, unspecified: Secondary | ICD-10-CM | POA: Insufficient documentation

## 2018-03-27 DIAGNOSIS — F329 Major depressive disorder, single episode, unspecified: Secondary | ICD-10-CM | POA: Insufficient documentation

## 2018-03-27 DIAGNOSIS — S52601A Unspecified fracture of lower end of right ulna, initial encounter for closed fracture: Secondary | ICD-10-CM

## 2018-03-27 DIAGNOSIS — Y939 Activity, unspecified: Secondary | ICD-10-CM | POA: Insufficient documentation

## 2018-03-27 DIAGNOSIS — M5416 Radiculopathy, lumbar region: Secondary | ICD-10-CM | POA: Diagnosis not present

## 2018-03-27 DIAGNOSIS — S52501A Unspecified fracture of the lower end of right radius, initial encounter for closed fracture: Secondary | ICD-10-CM

## 2018-03-27 DIAGNOSIS — M1990 Unspecified osteoarthritis, unspecified site: Secondary | ICD-10-CM | POA: Insufficient documentation

## 2018-03-27 HISTORY — PX: OPEN REDUCTION INTERNAL FIXATION (ORIF) DISTAL RADIAL FRACTURE: SHX5989

## 2018-03-27 HISTORY — DX: Dorsalgia, unspecified: M54.9

## 2018-03-27 HISTORY — DX: Other chronic pain: G89.29

## 2018-03-27 HISTORY — DX: Unspecified fracture of the lower end of right radius, initial encounter for closed fracture: S52.501A

## 2018-03-27 SURGERY — OPEN REDUCTION INTERNAL FIXATION (ORIF) DISTAL RADIUS FRACTURE
Anesthesia: General | Site: Wrist | Laterality: Right

## 2018-03-27 MED ORDER — MIDAZOLAM HCL 2 MG/2ML IJ SOLN
INTRAMUSCULAR | Status: AC
  Filled 2018-03-27: qty 2

## 2018-03-27 MED ORDER — OXYCODONE HCL 5 MG PO TABS: 5.0000 mg | ORAL_TABLET | Freq: Once | ORAL | Status: DC | PRN

## 2018-03-27 MED ORDER — ACETAMINOPHEN 325 MG PO TABS: 325.0000 mg | ORAL_TABLET | ORAL | Status: DC | PRN

## 2018-03-27 MED ORDER — SODIUM CHLORIDE 0.9 % IJ SOLN
INTRAMUSCULAR | Status: AC
  Filled 2018-03-27: qty 10

## 2018-03-27 MED ORDER — DEXAMETHASONE SODIUM PHOSPHATE 10 MG/ML IJ SOLN
INTRAMUSCULAR | Status: DC | PRN
  Administered 2018-03-27: 10 mg via INTRAVENOUS

## 2018-03-27 MED ORDER — ZINC SULFATE 220 (50 ZN) MG PO CAPS: 220.0000 mg | ORAL_CAPSULE | Freq: Every day | ORAL | 0 refills | Status: DC

## 2018-03-27 MED ORDER — OXYCODONE-ACETAMINOPHEN 5-325 MG PO TABS: 1.0000 | ORAL_TABLET | ORAL | 0 refills | Status: DC | PRN

## 2018-03-27 MED ORDER — KETOROLAC TROMETHAMINE 30 MG/ML IJ SOLN: 30.0000 mg | Freq: Once | INTRAMUSCULAR | Status: DC | PRN

## 2018-03-27 MED ORDER — FENTANYL CITRATE (PF) 100 MCG/2ML IJ SOLN
INTRAMUSCULAR | Status: AC
  Filled 2018-03-27: qty 2

## 2018-03-27 MED ORDER — CALCIUM CARBONATE-VITAMIN D 500-200 MG-UNIT PO TABS: 1.0000 | ORAL_TABLET | Freq: Three times a day (TID) | ORAL | 12 refills | Status: DC

## 2018-03-27 MED ORDER — LACTATED RINGERS IV SOLN
INTRAVENOUS | Status: DC
  Administered 2018-03-27: 12:00:00 via INTRAVENOUS

## 2018-03-27 MED ORDER — ONDANSETRON HCL 4 MG/2ML IJ SOLN: 4.0000 mg | Freq: Once | INTRAMUSCULAR | Status: DC | PRN

## 2018-03-27 MED ORDER — MEPERIDINE HCL 25 MG/ML IJ SOLN: 6.2500 mg | INTRAMUSCULAR | Status: DC | PRN

## 2018-03-27 MED ORDER — PROMETHAZINE HCL 25 MG PO TABS: 25.0000 mg | ORAL_TABLET | Freq: Four times a day (QID) | ORAL | 1 refills | Status: DC | PRN

## 2018-03-27 MED ORDER — ONDANSETRON HCL 4 MG PO TABS: 4.0000 mg | ORAL_TABLET | Freq: Three times a day (TID) | ORAL | 0 refills | Status: DC | PRN

## 2018-03-27 MED ORDER — CEFAZOLIN SODIUM-DEXTROSE 2-4 GM/100ML-% IV SOLN
2.0000 g | INTRAVENOUS | Status: AC
  Administered 2018-03-27: 2 g via INTRAVENOUS

## 2018-03-27 MED ORDER — ONDANSETRON HCL 4 MG/2ML IJ SOLN
INTRAMUSCULAR | Status: DC | PRN
  Administered 2018-03-27: 4 mg via INTRAVENOUS

## 2018-03-27 MED ORDER — LIDOCAINE HCL (CARDIAC) PF 100 MG/5ML IV SOSY
PREFILLED_SYRINGE | INTRAVENOUS | Status: AC
  Filled 2018-03-27: qty 5

## 2018-03-27 MED ORDER — VITAMIN C 500 MG PO CHEW: 500.0000 mg | CHEWABLE_TABLET | Freq: Two times a day (BID) | ORAL | 0 refills | Status: DC

## 2018-03-27 MED ORDER — FENTANYL CITRATE (PF) 100 MCG/2ML IJ SOLN
50.0000 ug | INTRAMUSCULAR | Status: DC | PRN
  Administered 2018-03-27: 50 ug via INTRAVENOUS
  Administered 2018-03-27: 100 ug via INTRAVENOUS

## 2018-03-27 MED ORDER — CHLORHEXIDINE GLUCONATE 4 % EX LIQD: 60.0000 mL | Freq: Once | CUTANEOUS | Status: DC

## 2018-03-27 MED ORDER — OXYCODONE HCL ER 10 MG PO T12A: 10.0000 mg | EXTENDED_RELEASE_TABLET | Freq: Two times a day (BID) | ORAL | 0 refills | Status: AC

## 2018-03-27 MED ORDER — ACETAMINOPHEN 160 MG/5ML PO SOLN: 325.0000 mg | ORAL | Status: DC | PRN

## 2018-03-27 MED ORDER — PROPOFOL 10 MG/ML IV BOLUS
INTRAVENOUS | Status: AC
  Filled 2018-03-27: qty 20

## 2018-03-27 MED ORDER — ROPIVACAINE HCL 7.5 MG/ML IJ SOLN
INTRAMUSCULAR | Status: DC | PRN
  Administered 2018-03-27 (×4): 5 mL via PERINEURAL

## 2018-03-27 MED ORDER — EPHEDRINE SULFATE 50 MG/ML IJ SOLN
INTRAMUSCULAR | Status: DC | PRN
  Administered 2018-03-27: 10 mg via INTRAVENOUS

## 2018-03-27 MED ORDER — PHENYLEPHRINE 40 MCG/ML (10ML) SYRINGE FOR IV PUSH (FOR BLOOD PRESSURE SUPPORT)
PREFILLED_SYRINGE | INTRAVENOUS | Status: AC
  Filled 2018-03-27: qty 10

## 2018-03-27 MED ORDER — PROPOFOL 10 MG/ML IV BOLUS
INTRAVENOUS | Status: DC | PRN
  Administered 2018-03-27: 150 mg via INTRAVENOUS

## 2018-03-27 MED ORDER — LACTATED RINGERS IV SOLN
INTRAVENOUS | Status: DC
  Administered 2018-03-27: 14:00:00 via INTRAVENOUS

## 2018-03-27 MED ORDER — DEXAMETHASONE SODIUM PHOSPHATE 10 MG/ML IJ SOLN
INTRAMUSCULAR | Status: AC
  Filled 2018-03-27: qty 1

## 2018-03-27 MED ORDER — FENTANYL CITRATE (PF) 100 MCG/2ML IJ SOLN: 25.0000 ug | INTRAMUSCULAR | Status: DC | PRN

## 2018-03-27 MED ORDER — ONDANSETRON HCL 4 MG/2ML IJ SOLN
INTRAMUSCULAR | Status: AC
  Filled 2018-03-27: qty 2

## 2018-03-27 MED ORDER — MIDAZOLAM HCL 2 MG/2ML IJ SOLN
1.0000 mg | INTRAMUSCULAR | Status: DC | PRN
  Administered 2018-03-27: 2 mg via INTRAVENOUS
  Administered 2018-03-27: 1 mg via INTRAVENOUS

## 2018-03-27 MED ORDER — OXYCODONE HCL 5 MG/5ML PO SOLN: 5.0000 mg | Freq: Once | ORAL | Status: DC | PRN

## 2018-03-27 MED ORDER — LIDOCAINE HCL (CARDIAC) PF 100 MG/5ML IV SOSY
PREFILLED_SYRINGE | INTRAVENOUS | Status: DC | PRN
  Administered 2018-03-27: 50 mg via INTRAVENOUS

## 2018-03-27 MED ORDER — CEFAZOLIN SODIUM-DEXTROSE 2-4 GM/100ML-% IV SOLN
INTRAVENOUS | Status: AC
  Filled 2018-03-27: qty 100

## 2018-03-27 MED ORDER — EPHEDRINE SULFATE 50 MG/ML IJ SOLN
INTRAMUSCULAR | Status: AC
  Filled 2018-03-27: qty 1

## 2018-03-27 MED ORDER — SCOPOLAMINE 1 MG/3DAYS TD PT72: 1.0000 | MEDICATED_PATCH | Freq: Once | TRANSDERMAL | Status: DC | PRN

## 2018-03-27 SURGICAL SUPPLY — 72 items
BANDAGE ACE 3X5.8 VEL STRL LF (GAUZE/BANDAGES/DRESSINGS) ×2 IMPLANT
BANDAGE ACE 4X5 VEL STRL LF (GAUZE/BANDAGES/DRESSINGS) IMPLANT
BIT DRILL 2.2 SS TIBIAL (BIT) ×1 IMPLANT
BLADE SURG 15 STRL LF DISP TIS (BLADE) ×2 IMPLANT
BLADE SURG 15 STRL SS (BLADE) ×4
BNDG CMPR 9X4 STRL LF SNTH (GAUZE/BANDAGES/DRESSINGS) ×1
BNDG ESMARK 4X9 LF (GAUZE/BANDAGES/DRESSINGS) ×2 IMPLANT
BRUSH SCRUB EZ PLAIN DRY (MISCELLANEOUS) ×2 IMPLANT
CORD BIPOLAR FORCEPS 12FT (ELECTRODE) ×2 IMPLANT
COVER BACK TABLE 60X90IN (DRAPES) ×2 IMPLANT
COVER MAYO STAND STRL (DRAPES) ×2 IMPLANT
CUFF TOURNIQUET SINGLE 18IN (TOURNIQUET CUFF) ×1 IMPLANT
DRAPE EXTREMITY T 121X128X90 (DRAPE) ×2 IMPLANT
DRAPE OEC MINIVIEW 54X84 (DRAPES) ×2 IMPLANT
DRAPE SURG 17X23 STRL (DRAPES) ×2 IMPLANT
GAUZE SPONGE 4X4 12PLY STRL (GAUZE/BANDAGES/DRESSINGS) ×2 IMPLANT
GAUZE SPONGE 4X4 16PLY XRAY LF (GAUZE/BANDAGES/DRESSINGS) IMPLANT
GAUZE XEROFORM 1X8 LF (GAUZE/BANDAGES/DRESSINGS) ×2 IMPLANT
GLOVE BIOGEL PI IND STRL 7.0 (GLOVE) ×1 IMPLANT
GLOVE BIOGEL PI INDICATOR 7.0 (GLOVE) ×3
GLOVE ECLIPSE 7.0 STRL STRAW (GLOVE) ×2 IMPLANT
GLOVE SKINSENSE NS SZ7.5 (GLOVE) ×1
GLOVE SKINSENSE STRL SZ7.5 (GLOVE) ×1 IMPLANT
GLOVE SURG SS PI 6.5 STRL IVOR (GLOVE) ×1 IMPLANT
GLOVE SURG SYN 7.5  E (GLOVE) ×1
GLOVE SURG SYN 7.5 E (GLOVE) ×1 IMPLANT
GLOVE SURG SYN 7.5 PF PI (GLOVE) ×1 IMPLANT
GOWN STRL REIN XL XLG (GOWN DISPOSABLE) ×2 IMPLANT
GOWN STRL REUS W/ TWL LRG LVL3 (GOWN DISPOSABLE) ×1 IMPLANT
GOWN STRL REUS W/ TWL XL LVL3 (GOWN DISPOSABLE) ×1 IMPLANT
GOWN STRL REUS W/TWL LRG LVL3 (GOWN DISPOSABLE) ×2
GOWN STRL REUS W/TWL XL LVL3 (GOWN DISPOSABLE) ×2
K-WIRE 1.6 (WIRE) ×4
K-WIRE FX5X1.6XNS BN SS (WIRE) ×2
KWIRE FX5X1.6XNS BN SS (WIRE) IMPLANT
NS IRRIG 1000ML POUR BTL (IV SOLUTION) ×2 IMPLANT
PACK BASIN DAY SURGERY FS (CUSTOM PROCEDURE TRAY) ×2 IMPLANT
PAD CAST 3X4 CTTN HI CHSV (CAST SUPPLIES) ×1 IMPLANT
PADDING CAST ABS 3INX4YD NS (CAST SUPPLIES)
PADDING CAST ABS COTTON 3X4 (CAST SUPPLIES) IMPLANT
PADDING CAST COTTON 3X4 STRL (CAST SUPPLIES) ×2
PADDING CAST SYNTHETIC 4 (CAST SUPPLIES)
PADDING CAST SYNTHETIC 4X4 STR (CAST SUPPLIES) IMPLANT
PLATE DVR CROSSLOCK STD RT (Plate) ×1 IMPLANT
RUBBERBAND STERILE (MISCELLANEOUS) ×2 IMPLANT
SCREW LOCK 12X2.7X 3 LD (Screw) IMPLANT
SCREW LOCK 16X2.7X 3 LD TPR (Screw) IMPLANT
SCREW LOCK 18X2.7X 3 LD TPR (Screw) IMPLANT
SCREW LOCK 20X2.7X 3 LD TPR (Screw) IMPLANT
SCREW LOCKING 2.7X11MM (Screw) ×1 IMPLANT
SCREW LOCKING 2.7X12MM (Screw) ×2 IMPLANT
SCREW LOCKING 2.7X13MM (Screw) ×1 IMPLANT
SCREW LOCKING 2.7X15MM (Screw) ×1 IMPLANT
SCREW LOCKING 2.7X16 (Screw) ×2 IMPLANT
SCREW LOCKING 2.7X18 (Screw) ×6 IMPLANT
SCREW LOCKING 2.7X20MM (Screw) ×4 IMPLANT
SCREW LP NL 2.7X22MM (Screw) ×1 IMPLANT
SLEEVE SCD COMPRESS KNEE MED (MISCELLANEOUS) ×2 IMPLANT
SPLINT FIBERGLASS 3X35 (CAST SUPPLIES) ×1 IMPLANT
SPONGE LAP 18X18 RF (DISPOSABLE) ×2 IMPLANT
STOCKINETTE 4X48 STRL (DRAPES) ×2 IMPLANT
SUCTION FRAZIER HANDLE 10FR (MISCELLANEOUS) ×1
SUCTION TUBE FRAZIER 10FR DISP (MISCELLANEOUS) ×1 IMPLANT
SUT ETHILON 4 0 PS 2 18 (SUTURE) ×3 IMPLANT
SUT VIC AB 3-0 PS1 18 (SUTURE) ×4
SUT VIC AB 3-0 PS1 18XBRD (SUTURE) ×1 IMPLANT
SYR BULB 3OZ (MISCELLANEOUS) ×2 IMPLANT
TOWEL GREEN STERILE FF (TOWEL DISPOSABLE) ×4 IMPLANT
TOWEL OR NON WOVEN STRL DISP B (DISPOSABLE) ×2 IMPLANT
TRAY DSU PREP LF (CUSTOM PROCEDURE TRAY) ×2 IMPLANT
TUBE CONNECTING 20X1/4 (TUBING) ×2 IMPLANT
UNDERPAD 30X30 (UNDERPADS AND DIAPERS) ×2 IMPLANT

## 2018-03-27 NOTE — Anesthesia Procedure Notes (Signed)
Anesthesia Regional Block: Supraclavicular block   Pre-Anesthetic Checklist: ,, timeout performed, Correct Patient, Correct Site, Correct Laterality, Correct Procedure, Correct Position, site marked, Risks and benefits discussed,  Surgical consent,  Pre-op evaluation,  At surgeon's request and post-op pain management  Laterality: Left and Upper  Prep: chloraprep       Needles:  Injection technique: Single-shot  Needle Type: Echogenic Stimulator Needle     Needle Length: 9cm  Needle Gauge: 21   Needle insertion depth: 1.5 cm   Additional Needles:   Procedures:,,,, ultrasound used (permanent image in chart),,,,  Narrative:  Start time: 03/27/2018 2:00 PM End time: 03/27/2018 2:09 PM Injection made incrementally with aspirations every 5 mL.  Performed by: Personally  Anesthesiologist: Leilani Able, MD

## 2018-03-27 NOTE — Op Note (Signed)
   DATE OF SURGERY: 03/27/2018  PREOPERATIVE DIAGNOSIS: right distal radius fracture  POSTOPERATIVE DIAGNOSIS: same  PROCEDURE: Open reduction and internal fixation, Right distal radius. CPT [phone] 3 or more fragments  IMPLANT:  Implant Name Type Inv. Item Serial No. Manufacturer Lot No. LRB No. Used  PLATE DVR CROSSLOCK STD RT - [id number] Plate PLATE DVR CROSSLOCK STD RT  ZIMMER RECON(ORTH,TRAU,BIO,SG) IN TRAY Right 1  SCREW LP NL 2.7X22MM - [id number] Screw SCREW LP NL 2.7X22MM  ZIMMER RECON(ORTH,TRAU,BIO,SG) IN TRAY Right 1  SCREW LOCKING 2.7X11MM - [id number] Screw SCREW LOCKING 2.7X11MM  ZIMMER RECON(ORTH,TRAU,BIO,SG) IN TRAY Right 1  SCREW LOCKING 2.7X13MM - [id number] Screw SCREW LOCKING 2.7X13MM  ZIMMER RECON(ORTH,TRAU,BIO,SG) IN TRAY Right 1  SCREW LOCKING 2.7X18 - [id number] Screw SCREW LOCKING 2.7X18  ZIMMER RECON(ORTH,TRAU,BIO,SG) IN TRAY Right 3  SCREW LOCKING 2.7X20MM - [id number] Screw SCREW LOCKING 2.7X20MM  ZIMMER RECON(ORTH,TRAU,BIO,SG) IN TRAY Right 2  SCREW LOCKING 2.7X15MM - [id number] Screw SCREW LOCKING 2.7X15MM  ZIMMER RECON(ORTH,TRAU,BIO,SG) IN TRAY Right 1  SCREW LOCKING 2.7X16 - [id number] Screw SCREW LOCKING 2.7X16  ZIMMER RECON(ORTH,TRAU,BIO,SG) IN TRAY Right 1  SCREW LOCKING 2.7X12MM - [id number] Screw SCREW LOCKING 2.7X12MM  ZIMMER RECON(ORTH,TRAU,BIO,SG) IN TRAY Right 1     SURGEON: N. Glee Arvin, M.D.  ASSIST: Starlyn Skeans [place], [place]; necessary for the timely completion of procedure and due to complexity of procedure.  ANESTHESIA: General  TOURNIQUET TIME: less than 45 mins  BLOOD LOSS: Minimal.  COMPLICATIONS: None.  PATHOLOGY: None.  TIME OUT: Performed before the start of procedure.  INDICATIONS: The patient is a 57 y.o. female who presented with a displaced distal radius fracture indicated for osteosynthesis.  DESCRIPTION OF PROCEDURE:  The patient was identified in the preoperative holding area.  The operative site was marked by the surgeon and  confirmed by the patient.  He was brought back to the operating room.  Anesthesia was induced by the anesthesia team.  A well padded nonsterile tourniquet was placed. The operative extremity was prepped and draped in standard sterile fashion.  A timeout was performed.  Preoperative antibiotics were given. The extremity was exsanguinated, tourniquet inflated to 250 mmHg.  A FCR approach was used.  Dissection through the sheath of FCR was carried out and the FCR tendon was retracted. The floor of FCR sheath was released. Flexor tendons and median nerve were retracted ulnarly and protected. Pronator quadratus was released from distal radius. Fracture lines were visualized. Fracture was reduced under fluoroscopy. A volar locking plate was applied to the volar surface of the distal radius. Distal locking screws and nonlocking shaft screws were inserted. Fluoroscopy was used to show adequate reduction. The tourniquet was deflated. Hemostasis was achieved. The wound was irrigated. Incision closed in layers. Sterile dressing applied. Wrist immobilized in a removable brace. The patient was transferred to the recovery room in stable condition after all counts were correct.  POSTOPERATIVE PLAN: The patient will remain in the brace until instructed. Sutures will be removed in two weeks. About four weeks after surgery, will start wrist range of motion exercises, but in the meantime before then she will start aggressive finger range of motion exercises without resistance.  The "six pack of hand exercises" handout was given to the patient.

## 2018-03-27 NOTE — H&P (Signed)
PREOPERATIVE H&P  Chief Complaint: right distal radius fracture  HPI: Courtney Kramer is a 57 y.o. female who presents for surgical treatment of right distal radius fracture.  She denies any changes in medical history.  Past Medical History:  Diagnosis Date  . Anxiety   . Arthritis   . Chronic back pain   . Closed fracture of right distal radius   . History of kidney stones   . Renal calculus, right   . Wears glasses    Past Surgical History:  Procedure Laterality Date  . COLONOSCOPY  2012  . CYSTO/  RIGHT URETERAL STENT PLACEMENT  05-10-2008  . CYSTOSCOPY W/ URETERAL STENT PLACEMENT Right 06/09/2014   Procedure: CYSTOSCOPY WITH RETROGRADE PYELOGRAM/URETERAL STENT PLACEMENT;  Surgeon: Valetta Fuller, MD;  Location: [facility];  Service: Urology;  Laterality: Right;  . EXTRACORPOREAL SHOCK WAVE LITHOTRIPSY Right 05-10-2008  . KNEE ARTHROSCOPY Left 2009  . WISDOM TOOTH EXTRACTION  AGE 11   Social History   Socioeconomic History  . Marital status: Married    Spouse name: Not on file  . Number of children: Not on file  . Years of education: Not on file  . Highest education level: Not on file  Occupational History  . Not on file  Social Needs  . Financial resource strain: Not on file  . Food insecurity:    Worry: Not on file    Inability: Not on file  . Transportation needs:    Medical: Not on file    Non-medical: Not on file  Tobacco Use  . Smoking status: Never Smoker  . Smokeless tobacco: Never Used  Substance and Sexual Activity  . Alcohol use: Yes    Comment: OCCASONAL  . Drug use: No  . Sexual activity: Not on file  Lifestyle  . Physical activity:    Days per week: Not on file    Minutes per session: Not on file  . Stress: Not on file  Relationships  . Social connections:    Talks on phone: Not on file    Gets together: Not on file    Attends religious service: Not on file    Active member of club or organization: Not on file   Attends meetings of clubs or organizations: Not on file    Relationship status: Not on file  Other Topics Concern  . Not on file  Social History Narrative  . Not on file   History reviewed. No pertinent family history. Allergies  Allergen Reactions  . Sulfa Antibiotics Hives   Prior to Admission medications   Medication Sig Start Date End Date Taking? Authorizing Provider  DULoxetine (CYMBALTA) 30 MG capsule Take 30 mg by mouth every morning.   Yes [provider]  HYDROcodone-acetaminophen (NORCO) 5-325 MG tablet Take 1-2 tablets by mouth 2 (two) times daily as needed. 03/24/18  Yes Tarry Kos, MD     Positive ROS: All other systems have been reviewed and were otherwise negative with the exception of those mentioned in the HPI and as above.  Physical Exam: General: Alert, no acute distress Cardiovascular: No pedal edema Respiratory: No cyanosis, no use of accessory musculature GI: abdomen soft Skin: No lesions in the area of chief complaint Neurologic: Sensation intact distally Psychiatric: Patient is competent for consent with normal mood and affect Lymphatic: no lymphedema  MUSCULOSKELETAL: exam stable  Assessment: right distal radius fracture  Plan: Plan for Procedure(s): OPEN REDUCTION INTERNAL FIXATION (ORIF) RIGHT DISTAL RADIUS  FRACTURE  The risks benefits and alternatives were discussed with the patient including but not limited to the risks of nonoperative treatment, versus surgical intervention including infection, bleeding, nerve injury,  blood clots, cardiopulmonary complications, morbidity, mortality, among others, and they were willing to proceed.   Glee Arvin, MD   03/27/2018 1:54 PM

## 2018-03-27 NOTE — Transfer of Care (Signed)
Immediate Anesthesia Transfer of Care Note  Patient: Courtney Kramer  Procedure(s) Performed: OPEN REDUCTION INTERNAL FIXATION (ORIF) RIGHT DISTAL RADIUS FRACTURE (Right Wrist)  Patient Location: PACU  Anesthesia Type:GA combined with regional for post-op pain  Level of Consciousness: awake, alert  and drowsy  Airway & Oxygen Therapy: Patient Spontanous Breathing and Patient connected to face mask oxygen  Post-op Assessment: Report given to RN and Post -op Vital signs reviewed and stable  Post vital signs: Reviewed and stable  Last Vitals:  Vitals Value Taken Time  BP 120/76 03/27/2018  3:50 PM  Temp    Pulse 98 03/27/2018  3:55 PM  Resp 19 03/27/2018  3:55 PM  SpO2 98 % 03/27/2018  3:55 PM  Vitals shown include unvalidated device data.  Last Pain:  Vitals:   03/27/18 1201  TempSrc: Oral  PainSc: 6       Patients Stated Pain Goal: 2 (03/27/18 1201)  Complications: No apparent anesthesia complications

## 2018-03-27 NOTE — Progress Notes (Signed)
AssistedDr. Hatchett with right, ultrasound guided, supraclavicular block. Side rails up, monitors on throughout procedure. See vital signs in flow sheet. Tolerated Procedure well.  

## 2018-03-27 NOTE — Anesthesia Procedure Notes (Signed)
Procedure Name: LMA Insertion Date/Time: 03/27/2018 2:28 PM Performed by: Ronnette Hila, CRNA Pre-anesthesia Checklist: Patient identified, Emergency Drugs available, Suction available and Patient being monitored Patient Re-evaluated:Patient Re-evaluated prior to induction Oxygen Delivery Method: Circle system utilized Preoxygenation: Pre-oxygenation with 100% oxygen Induction Type: IV induction Ventilation: Mask ventilation without difficulty LMA: LMA inserted LMA Size: 4.0 Number of attempts: 1 Airway Equipment and Method: Bite block Placement Confirmation: positive ETCO2 Tube secured with: Tape Dental Injury: Teeth and Oropharynx as per pre-operative assessment

## 2018-03-27 NOTE — Discharge Instructions (Signed)
Postoperative instructions: ° °Weightbearing instructions: non weight bearing ° °Dressing instructions: Keep your dressing and/or splint clean and dry at all times.  It will be removed at your first post-operative appointment.  Your stitches and/or staples will be removed at this visit. ° °Incision instructions:  Do not soak your incision for 3 weeks after surgery.  If the incision gets wet, pat dry and do not scrub the incision. ° °Pain control:  You have been given a prescription to be taken as directed for post-operative pain control.  In addition, elevate the operative extremity above the heart at all times to prevent swelling and throbbing pain. ° °Take over-the-counter Colace, 100mg by mouth twice a day while taking narcotic pain medications to help prevent constipation. ° °Follow up appointments: °1) 10-14 days for suture removal and wound check. °2) Dr. Xu as scheduled. ° ° ------------------------------------------------------------------------------------------------------------- ° °After Surgery Pain Control: ° °After your surgery, post-surgical discomfort or pain is likely. This discomfort can last several days to a few weeks. At certain times of the day your discomfort may be more intense.  °Did you receive a nerve block?  °A nerve block can provide pain relief for one hour to two days after your surgery. As long as the nerve block is working, you will experience little or no sensation in the area the surgeon operated on.  °As the nerve block wears off, you will begin to experience pain or discomfort. It is very important that you begin taking your prescribed pain medication before the nerve block fully wears off. Treating your pain at the first sign of the block wearing off will ensure your pain is better controlled and more tolerable when full-sensation returns. Do not wait until the pain is intolerable, as the medicine will be less effective. It is better to treat pain in advance than to try and  catch up.  °General Anesthesia:  °If you did not receive a nerve block during your surgery, you will need to start taking your pain medication shortly after your surgery and should continue to do so as prescribed by your surgeon.  °Pain Medication:  °Most commonly we prescribe Vicodin and Percocet for post-operative pain. Both of these medications contain a combination of acetaminophen (Tylenol®) and a narcotic to help control pain.  °· It takes between 30 and 45 minutes before pain medication starts to work. It is important to take your medication before your pain level gets too intense.  °· Nausea is a common side effect of many pain medications. You will want to eat something before taking your pain medicine to help prevent nausea.  °· If you are taking a prescription pain medication that contains acetaminophen, we recommend that you do not take additional over the counter acetaminophen (Tylenol®).  °Other pain relieving options:  °· Using a cold pack to ice the affected area a few times a day (15 to 20 minutes at a time) can help to relieve pain, reduce swelling and bruising.  °· Elevation of the affected area can also help to reduce pain and swelling. ° ° ° ° ° ° ° ° ° ° °Post Anesthesia Home Care Instructions ° °Activity: °Get plenty of rest for the remainder of the day. A responsible individual must stay with you for 24 hours following the procedure.  °For the next 24 hours, DO NOT: °-Drive a car °-Operate machinery °-Drink alcoholic beverages °-Take any medication unless instructed by your physician °-Make any legal decisions or sign important papers. ° °Meals: °Start   with liquid foods such as gelatin or soup. Progress to regular foods as tolerated. Avoid greasy, spicy, heavy foods. If nausea and/or vomiting occur, drink only clear liquids until the nausea and/or vomiting subsides. Call your physician if vomiting continues. ° °Special Instructions/Symptoms: °Your throat may feel dry or sore from the  anesthesia or the breathing tube placed in your throat during surgery. If this causes discomfort, gargle with warm salt water. The discomfort should disappear within 24 hours. ° °If you had a scopolamine patch placed behind your ear for the management of post- operative nausea and/or vomiting: ° °1. The medication in the patch is effective for 72 hours, after which it should be removed.  Wrap patch in a tissue and discard in the trash. Wash hands thoroughly with soap and water. °2. You may remove the patch earlier than 72 hours if you experience unpleasant side effects which may include dry mouth, dizziness or visual disturbances. °3. Avoid touching the patch. Wash your hands with soap and water after contact with the patch. °  ° ° ° °Regional Anesthesia Blocks ° °1. Numbness or the inability to move the "blocked" extremity may last from 3-48 hours after placement. The length of time depends on the medication injected and your individual response to the medication. If the numbness is not going away after 48 hours, call your surgeon. ° °2. The extremity that is blocked will need to be protected until the numbness is gone and the  Strength has returned. Because you cannot feel it, you will need to take extra care to avoid injury. Because it may be weak, you may have difficulty moving it or using it. You may not know what position it is in without looking at it while the block is in effect. ° °3. For blocks in the legs and feet, returning to weight bearing and walking needs to be done carefully. You will need to wait until the numbness is entirely gone and the strength has returned. You should be able to move your leg and foot normally before you try and bear weight or walk. You will need someone to be with you when you first try to ensure you do not fall and possibly risk injury. ° °4. Bruising and tenderness at the needle site are common side effects and will resolve in a few days. ° °5. Persistent numbness or new  problems with movement should be communicated to the surgeon or the [facility] ([phone])/ [facility] ([phone]). ° °

## 2018-03-27 NOTE — Anesthesia Preprocedure Evaluation (Signed)
Anesthesia Evaluation  Patient identified by MRN, date of birth, ID band Patient awake    Reviewed: Allergy & Precautions, NPO status , Patient's Chart, lab work & pertinent test results  Airway Mallampati: I       Dental no notable dental hx. (+) Teeth Intact   Pulmonary    Pulmonary exam normal breath sounds clear to auscultation       Cardiovascular negative cardio ROS Normal cardiovascular exam Rhythm:Regular Rate:Normal     Neuro/Psych    GI/Hepatic Neg liver ROS,   Endo/Other  negative endocrine ROS  Renal/GU   negative genitourinary   Musculoskeletal   Abdominal Normal abdominal exam  (+)   Peds  Hematology negative hematology ROS (+)   Anesthesia Other Findings   Reproductive/Obstetrics                             Anesthesia Physical Anesthesia Plan  ASA: II  Anesthesia Plan: General   Post-op Pain Management:  Regional for Post-op pain   Induction:   PONV Risk Score and Plan: 3 and Ondansetron and Dexamethasone  Airway Management Planned: LMA  Additional Equipment:   Intra-op Plan:   Post-operative Plan: Extubation in OR  Informed Consent: I have reviewed the patients History and Physical, chart, labs and discussed the procedure including the risks, benefits and alternatives for the proposed anesthesia with the patient or authorized representative who has indicated his/her understanding and acceptance.   Dental advisory given  Plan Discussed with: CRNA and Surgeon  Anesthesia Plan Comments:         Anesthesia Quick Evaluation

## 2018-03-28 ENCOUNTER — Encounter (HOSPITAL_BASED_OUTPATIENT_CLINIC_OR_DEPARTMENT_OTHER): Payer: Self-pay | Admitting: Orthopaedic Surgery

## 2018-03-30 NOTE — Anesthesia Postprocedure Evaluation (Signed)
Anesthesia Post Note  Patient: Courtney Kramer  Procedure(s) Performed: OPEN REDUCTION INTERNAL FIXATION (ORIF) RIGHT DISTAL RADIUS FRACTURE (Right Wrist)     Patient location during evaluation: PACU Anesthesia Type: General Level of consciousness: awake Pain management: pain level controlled Vital Signs Assessment: post-procedure vital signs reviewed and stable Respiratory status: spontaneous breathing Cardiovascular status: stable Postop Assessment: no apparent nausea or vomiting Anesthetic complications: no    Last Vitals:  Vitals:   03/27/18 1615 03/27/18 1645  BP: 135/66 (!) 144/74  Pulse: 94 91  Resp: 15 18  Temp:  36.6 C  SpO2: 96% 96%    Last Pain:  Vitals:   03/28/18 1348  TempSrc:   PainSc: 7    Pain Goal: Patients Stated Pain Goal: 2 (03/27/18 1201)               Kiele Heavrin JR,JOHN Susann Givens

## 2018-03-31 ENCOUNTER — Telehealth (INDEPENDENT_AMBULATORY_CARE_PROVIDER_SITE_OTHER): Payer: Self-pay | Admitting: Orthopaedic Surgery

## 2018-03-31 NOTE — Telephone Encounter (Signed)
#  30 of norco

## 2018-03-31 NOTE — Telephone Encounter (Signed)
See message below  Please advise

## 2018-03-31 NOTE — Telephone Encounter (Signed)
Patient called asked if Dr Roda Shutters can prescribe (Tramadol) for the pain. Patient said she did not get the stronger medicine that was prescribed for her at the hospital.  Patient said she only have 2 tabs left of the Hydrocodone. Patient said the Hydrocodone make her a little dizzy. The number to contact patient is [phone]. Patient uses the [facility] at [facility].

## 2018-04-01 ENCOUNTER — Other Ambulatory Visit (INDEPENDENT_AMBULATORY_CARE_PROVIDER_SITE_OTHER): Payer: Self-pay

## 2018-04-01 MED ORDER — TRAMADOL HCL 50 MG PO TABS: 50.0000 mg | ORAL_TABLET | Freq: Two times a day (BID) | ORAL | 0 refills | Status: DC | PRN

## 2018-04-01 NOTE — Telephone Encounter (Signed)
yes

## 2018-04-01 NOTE — Telephone Encounter (Signed)
Called into pharm.  Patient Aware.  

## 2018-04-01 NOTE — Telephone Encounter (Signed)
Patient called back and states she does not want anything strong with all that stuff and would like something not too strong, would you like to prescribe her Tramadol #30 instead?

## 2018-04-01 NOTE — Telephone Encounter (Signed)
Tried to call patient to see which Rx she wanted Norco or Tramadol?

## 2018-04-09 ENCOUNTER — Encounter (INDEPENDENT_AMBULATORY_CARE_PROVIDER_SITE_OTHER): Payer: Self-pay | Admitting: Orthopaedic Surgery

## 2018-04-09 ENCOUNTER — Other Ambulatory Visit (INDEPENDENT_AMBULATORY_CARE_PROVIDER_SITE_OTHER): Payer: Self-pay | Admitting: Physician Assistant

## 2018-04-09 ENCOUNTER — Ambulatory Visit (INDEPENDENT_AMBULATORY_CARE_PROVIDER_SITE_OTHER): Payer: BLUE CROSS/BLUE SHIELD

## 2018-04-09 ENCOUNTER — Ambulatory Visit (INDEPENDENT_AMBULATORY_CARE_PROVIDER_SITE_OTHER): Payer: BLUE CROSS/BLUE SHIELD | Admitting: Physician Assistant

## 2018-04-09 DIAGNOSIS — S52531D Colles' fracture of right radius, subsequent encounter for closed fracture with routine healing: Secondary | ICD-10-CM

## 2018-04-09 DIAGNOSIS — S52531A Colles' fracture of right radius, initial encounter for closed fracture: Secondary | ICD-10-CM

## 2018-04-09 MED ORDER — TRAMADOL HCL 50 MG PO TABS: 50.0000 mg | ORAL_TABLET | Freq: Two times a day (BID) | ORAL | 0 refills | Status: DC | PRN

## 2018-04-09 NOTE — Progress Notes (Signed)
Post-Op Visit Note   Patient: Courtney Kramer           Date of Birth: 08-10-1961           MRN: [id number] Visit Date: 04/09/2018 PCP: Dema Severin, NP   Assessment & Plan:  Chief Complaint:  Chief Complaint  Patient presents with  . Right Wrist - Pain, Follow-up, Routine Post Op   Visit Diagnoses:  1. Closed Colles' fracture of right radius with routine healing, subsequent encounter   2. Closed Colles' fracture of right radius, initial encounter     Plan: Patient is a pleasant 57 year old female who presents to our clinic today 14 days status post ORIF right distal radius fracture, date of surgery 03/27/2018.  She has been doing well in her splint.  Elevating for swelling.  She has been taking tramadol for pain.  No fevers, chills or any other systemic symptoms.  Examination of her right wrist reveals a well-healing surgical incision with nylon sutures in place.  Mild to moderate swelling.  She is neurovascularly intact distally.  X-rays demonstrate sufficient alignment of the hardware.  We will remove the nylon sutures and apply Steri-Strips.  We will transition the patient into a removable wrist splint.  She will remain nonweightbearing to the right upper extremity.  She will ice and elevate for pain and swelling.  I will start her in hand therapy when she is 4 weeks postop.  A prescription was given to the patient today for this.  She will follow-up with [place] in 4 weeks time for repeat evaluation and x-ray.  Call if concerns or questions in the meantime.  Follow-Up Instructions: Return in about 1 month (around 05/07/2018).   Orders:  Orders Placed This Encounter  Procedures  . XR Wrist Complete Right   No orders of the defined types were placed in this encounter.   Imaging: Xr Wrist Complete Right  Result Date: 04/09/2018 X-rays demonstrate sufficient alignment of the fracture and hardware   PMFS History: Patient Active Problem List   Diagnosis Date Noted  . Fracture,  Colles, right, closed 04/09/2018  . Closed fracture of right distal radius and ulna, initial encounter 03/27/2018  . Lumbar radiculopathy 09/19/2016  . RENAL CALCULUS 05/04/2008  . FLANK PAIN, RIGHT 05/04/2008   Past Medical History:  Diagnosis Date  . Anxiety   . Arthritis   . Chronic back pain   . Closed fracture of right distal radius   . History of kidney stones   . Renal calculus, right   . Wears glasses     History reviewed. No pertinent family history.  Past Surgical History:  Procedure Laterality Date  . COLONOSCOPY  2012  . CYSTO/  RIGHT URETERAL STENT PLACEMENT  05-10-2008  . CYSTOSCOPY W/ URETERAL STENT PLACEMENT Right 06/09/2014   Procedure: CYSTOSCOPY WITH RETROGRADE PYELOGRAM/URETERAL STENT PLACEMENT;  Surgeon: Valetta Fuller, MD;  Location: [facility];  Service: Urology;  Laterality: Right;  . EXTRACORPOREAL SHOCK WAVE LITHOTRIPSY Right 05-10-2008  . KNEE ARTHROSCOPY Left 2009  . OPEN REDUCTION INTERNAL FIXATION (ORIF) DISTAL RADIAL FRACTURE Right 03/27/2018   Procedure: OPEN REDUCTION INTERNAL FIXATION (ORIF) RIGHT DISTAL RADIUS FRACTURE;  Surgeon: Tarry Kos, MD;  Location: [facility];  Service: Orthopedics;  Laterality: Right;  . WISDOM TOOTH EXTRACTION  AGE 61   Social History   Occupational History  . Not on file  Tobacco Use  . Smoking status: Never Smoker  . Smokeless tobacco: Never Used  Substance and Sexual Activity  . Alcohol use: Yes    Comment: OCCASONAL  . Drug use: No  . Sexual activity: Not on file

## 2018-04-18 ENCOUNTER — Telehealth (INDEPENDENT_AMBULATORY_CARE_PROVIDER_SITE_OTHER): Payer: Self-pay | Admitting: Orthopaedic Surgery

## 2018-04-18 NOTE — Telephone Encounter (Signed)
Patient called asked when will she be starting (PT)  The number to contact patient is [phone]

## 2018-04-18 NOTE — Telephone Encounter (Signed)
Patient left a message yesterday wanting to know when is she suppose to start PT.  CB#[phone]

## 2018-04-18 NOTE — Telephone Encounter (Signed)
Duplicate message. 

## 2018-04-18 NOTE — Telephone Encounter (Signed)
Patient wondering when she can start PT, she has radius fracture

## 2018-04-21 ENCOUNTER — Other Ambulatory Visit (INDEPENDENT_AMBULATORY_CARE_PROVIDER_SITE_OTHER): Payer: Self-pay

## 2018-04-21 MED ORDER — IBUPROFEN-FAMOTIDINE 800-26.6 MG PO TABS: 1.0000 | ORAL_TABLET | Freq: Two times a day (BID) | ORAL | 1 refills | Status: DC

## 2018-04-21 NOTE — Telephone Encounter (Signed)
Called in Rx for patient.  

## 2018-04-21 NOTE — Telephone Encounter (Signed)
When she is 4 weeks out from surgery.  She should already have rx as I gave it to her at her last appt

## 2018-04-21 NOTE — Telephone Encounter (Signed)
Yes, it is one bid prn pain.

## 2018-04-21 NOTE — Telephone Encounter (Signed)
Sure.  Or have her pick up omeprazole or prilosec otc

## 2018-04-21 NOTE — Telephone Encounter (Signed)
Patient states she's still having a lot of pain "like a toothache"  She states Ibuprofen is really hurting her stomach, think we could maybe call her in some Duexis?

## 2018-04-21 NOTE — Telephone Encounter (Signed)
Do you know the dosing of Duexis?

## 2018-04-25 NOTE — Telephone Encounter (Signed)
Yes she can submerge.  She should start PT 4 weeks after surgery

## 2018-04-25 NOTE — Telephone Encounter (Signed)
Called patient no answer, could not leave VM mailbox is full

## 2018-04-25 NOTE — Telephone Encounter (Signed)
Yes she can submerge hand and she can start PT now (4 weeks after surgery)

## 2018-04-25 NOTE — Telephone Encounter (Signed)
Patient needs to know when she can drench/submerge her right wrist? [place] Also she wants to know when can she start therapy Dr. Roda Shutters said two weeks and that Mardella Layman said four weeks. She just needs that clarified CB # [phone]

## 2018-04-25 NOTE — Telephone Encounter (Signed)
See message below °

## 2018-04-28 NOTE — Telephone Encounter (Signed)
Tried calling again. No answer. Unable to [facility] due to VM being full and unable to accept any other calls at this time. Can discuss if patient returns call. Patient has been attempted to be reached in regards to her question x 2.

## 2018-05-15 ENCOUNTER — Ambulatory Visit (INDEPENDENT_AMBULATORY_CARE_PROVIDER_SITE_OTHER): Payer: Self-pay

## 2018-05-15 ENCOUNTER — Ambulatory Visit (INDEPENDENT_AMBULATORY_CARE_PROVIDER_SITE_OTHER): Payer: BLUE CROSS/BLUE SHIELD | Admitting: Orthopaedic Surgery

## 2018-05-15 DIAGNOSIS — S52501A Unspecified fracture of the lower end of right radius, initial encounter for closed fracture: Secondary | ICD-10-CM | POA: Diagnosis not present

## 2018-05-15 DIAGNOSIS — M545 Low back pain: Secondary | ICD-10-CM

## 2018-05-15 DIAGNOSIS — S52601A Unspecified fracture of lower end of right ulna, initial encounter for closed fracture: Secondary | ICD-10-CM

## 2018-05-15 DIAGNOSIS — G8929 Other chronic pain: Secondary | ICD-10-CM

## 2018-05-15 NOTE — Progress Notes (Signed)
   Post-Op Visit Note   Patient: Courtney Kramer           Date of Birth: 04-06-1961           MRN: [id number] Visit Date: 05/15/2018 PCP: Dema Severin, NP   Assessment & Plan:  Chief Complaint:  Chief Complaint  Patient presents with  . Right Arm - Routine Post Op   Visit Diagnoses:  1. Closed fracture of right distal radius and ulna, initial encounter     Plan: Patient is 7 weeks status post ORIF right distal radius fracture.  She has not attended any physical therapy due to financial reasons.  Her surgical scar is fully healed.  She does lack significant wrist extension.  Her grip strength is as expected for this time period.  Her x-rays demonstrate stable position and evidence of healing of the fracture.  I would like her to see [place] back in another 6 weeks with 2 view x-rays of the right wrist.  She states that she has had an exacerbation of her lumbar radiculopathy.  We will for an epidural steroid injection with Dr. Alvester Morin.  Follow-Up Instructions: Return in about 6 weeks (around 06/26/2018).   Orders:  Orders Placed This Encounter  Procedures  . XR Wrist Complete Right   No orders of the defined types were placed in this encounter.   Imaging: Xr Wrist Complete Right  Result Date: 05/15/2018 Stable fixation alignment of fracture with evidence of healing.   PMFS History: Patient Active Problem List   Diagnosis Date Noted  . Fracture, Colles, right, closed 04/09/2018  . Closed fracture of right distal radius and ulna, initial encounter 03/27/2018  . Lumbar radiculopathy 09/19/2016  . RENAL CALCULUS 05/04/2008  . FLANK PAIN, RIGHT 05/04/2008   Past Medical History:  Diagnosis Date  . Anxiety   . Arthritis   . Chronic back pain   . Closed fracture of right distal radius   . History of kidney stones   . Renal calculus, right   . Wears glasses     No family history on file.  Past Surgical History:  Procedure Laterality Date  . COLONOSCOPY  2012  . CYSTO/   RIGHT URETERAL STENT PLACEMENT  05-10-2008  . CYSTOSCOPY W/ URETERAL STENT PLACEMENT Right 06/09/2014   Procedure: CYSTOSCOPY WITH RETROGRADE PYELOGRAM/URETERAL STENT PLACEMENT;  Surgeon: Valetta Fuller, MD;  Location: [facility];  Service: Urology;  Laterality: Right;  . EXTRACORPOREAL SHOCK WAVE LITHOTRIPSY Right 05-10-2008  . KNEE ARTHROSCOPY Left 2009  . OPEN REDUCTION INTERNAL FIXATION (ORIF) DISTAL RADIAL FRACTURE Right 03/27/2018   Procedure: OPEN REDUCTION INTERNAL FIXATION (ORIF) RIGHT DISTAL RADIUS FRACTURE;  Surgeon: Tarry Kos, MD;  Location: [facility];  Service: Orthopedics;  Laterality: Right;  . WISDOM TOOTH EXTRACTION  AGE 44   Social History   Occupational History  . Not on file  Tobacco Use  . Smoking status: Never Smoker  . Smokeless tobacco: Never Used  Substance and Sexual Activity  . Alcohol use: Yes    Comment: OCCASONAL  . Drug use: No  . Sexual activity: Not on file

## 2018-05-15 NOTE — Addendum Note (Signed)
Addended by: Albertina Parr on: 05/15/2018 10:52 AM   Modules accepted: Orders

## 2018-06-02 ENCOUNTER — Ambulatory Visit (INDEPENDENT_AMBULATORY_CARE_PROVIDER_SITE_OTHER): Payer: Self-pay

## 2018-06-02 ENCOUNTER — Encounter (INDEPENDENT_AMBULATORY_CARE_PROVIDER_SITE_OTHER): Payer: Self-pay | Admitting: Physical Medicine and Rehabilitation

## 2018-06-02 ENCOUNTER — Ambulatory Visit (INDEPENDENT_AMBULATORY_CARE_PROVIDER_SITE_OTHER): Payer: BLUE CROSS/BLUE SHIELD | Admitting: Physical Medicine and Rehabilitation

## 2018-06-02 VITALS — BP 149/88 | HR 75 | Temp 97.7°F

## 2018-06-02 DIAGNOSIS — M5416 Radiculopathy, lumbar region: Secondary | ICD-10-CM | POA: Diagnosis not present

## 2018-06-02 DIAGNOSIS — M5116 Intervertebral disc disorders with radiculopathy, lumbar region: Secondary | ICD-10-CM | POA: Diagnosis not present

## 2018-06-02 MED ORDER — BETAMETHASONE SOD PHOS & ACET 6 (3-3) MG/ML IJ SUSP
12.0000 mg | Freq: Once | INTRAMUSCULAR | Status: AC
  Administered 2018-06-02: 12 mg

## 2018-06-02 NOTE — Patient Instructions (Signed)

## 2018-06-02 NOTE — Procedures (Signed)
Lumbosacral Transforaminal Epidural Steroid Injection - Sub-Pedicular Approach with Fluoroscopic Guidance  Patient: Courtney Kramer      Date of Birth: 10/03/1960 MRN: [id number] PCP: Dema Severin, NP      Visit Date: 06/02/2018   Universal Protocol:    Date/Time: 06/02/2018  Consent Given By: the patient  Position: PRONE  Additional Comments: Vital signs were monitored before and after the procedure. Patient was prepped and draped in the usual sterile fashion. The correct patient, procedure, and site was verified.   Injection Procedure Details:  Procedure Site One Meds Administered:  Meds ordered this encounter  Medications  . betamethasone acetate-betamethasone sodium phosphate (CELESTONE) injection 12 mg    Laterality: Right  Location/Site:  L4-L5  Needle size: 22 G  Needle type: Spinal  Needle Placement: Transforaminal  Findings:    -Comments: Excellent flow of contrast along the nerve and into the epidural space.  Procedure Details: After squaring off the end-plates to get a true AP view, the C-arm was positioned so that an oblique view of the foramen as noted above was visualized. The target area is just inferior to the "nose of the scotty dog" or sub pedicular. The soft tissues overlying this structure were infiltrated with 2-3 ml. of 1% Lidocaine without Epinephrine.  The spinal needle was inserted toward the target using a "trajectory" view along the fluoroscope beam.  Under AP and lateral visualization, the needle was advanced so it did not puncture dura and was located close the 6 O'Clock position of the pedical in AP tracterory. Biplanar projections were used to confirm position. Aspiration was confirmed to be negative for CSF and/or blood. A 1-2 ml. volume of Isovue-250 was injected and flow of contrast was noted at each level. Radiographs were obtained for documentation purposes.   After attaining the desired flow of contrast documented above, a 0.5 to  1.0 ml test dose of 0.25% Marcaine was injected into each respective transforaminal space.  The patient was observed for 90 seconds post injection.  After no sensory deficits were reported, and normal lower extremity motor function was noted,   the above injectate was administered so that equal amounts of the injectate were placed at each foramen (level) into the transforaminal epidural space.   Additional Comments:  The patient tolerated the procedure well Dressing: Band-Aid    Post-procedure details: Patient was observed during the procedure. Post-procedure instructions were reviewed.  Patient left the clinic in stable condition.

## 2018-06-02 NOTE — Progress Notes (Signed)
 .  Numeric Pain Rating Scale and Functional Assessment Average Pain 6   In the last MONTH (on 0-10 scale) has pain interfered with the following?  1. General activity like being  able to carry out your everyday physical activities such as walking, climbing stairs, carrying groceries, or moving a chair?  Rating(6)   +Driver, -BT, -Dye Allergies.  

## 2018-06-03 NOTE — Progress Notes (Signed)
Courtney Kramer - 57 y.o. female MRN [id number]  Date of birth: 1961/05/09  Office Visit Note: Visit Date: 06/02/2018 PCP: Dema Severin, NP Referred by: Dema Severin, NP  Subjective: Chief Complaint  Patient presents with  . Lower Back - Pain  . Right Leg - Pain   HPI: Courtney Kramer is a 57 year old female followed by Dr. Glee Arvin in Kramer office for orthopedic complaints.  Last time I saw her was in January 2018.  We completed a right L4 transforaminal injection for what was a disc herniation at L3-4 affecting Kramer lateral recess and L4 nerve root.  At that point she had failed conservative care was in severe pain and we did complete Kramer injection with actually very good relief she reports.  Since I have seen her she has had a distal radius fracture which required open fixation by Dr. Roda Shutters.  She reports several months now worsening back pain and pain radiating to Kramer right leg and more of Kramer lateral distribution which could be L4-L5.  She thinks is Kramer same or similar type pain.  She describes this as an aching stabbing pain at times.  She reports worsening with picking up things that are heavy as well as sitting for prolonged.  She reports standing and walking is fine.  She does wear a back brace at different times and that does seem to help.  She is been using ibuprofen with minor relief.  She rates her pain as a 6 out of 10 and it does affect her activities of daily living quite significantly.  She has been using some opioid medications during Kramer fracture of her arm but is not currently taking any.  She denies any specific trauma.  She has not had any focal weakness or bowel or bladder changes.  She feels like this is progressively been worsening over Kramer last several months.  She at first was hopeful that it would come and go to a degree but it has gotten progressively worse.  She denies any symptoms on Kramer left.  No new lifting injuries.  She does get a slight paresthesia feel.   Review of  Systems  Constitutional: Negative for chills, fever, malaise/fatigue and weight loss.  HENT: Negative for hearing loss and sinus pain.   Eyes: Negative for blurred vision, double vision and photophobia.  Respiratory: Negative for cough and shortness of breath.   Cardiovascular: Negative for chest pain, palpitations and leg swelling.  Gastrointestinal: Negative for abdominal pain, nausea and vomiting.  Genitourinary: Negative for flank pain.  Musculoskeletal: Positive for back pain. Negative for myalgias.       Right hip and leg pain  Skin: Negative for itching and rash.  Neurological: Positive for tingling. Negative for tremors, focal weakness and weakness.  Endo/Heme/Allergies: Negative.   Psychiatric/Behavioral: Negative for depression.  All other systems reviewed and are negative.  Otherwise per HPI.  Assessment & Plan: Visit Diagnoses:  1. Lumbar radiculopathy   2. Radiculopathy due to lumbar intervertebral disc disorder     Plan: Findings:  Chronic history of back pain and chronic pain syndrome with known herniated disc at L3-4 that was treated a year ago with a right L4 transforaminal injection.  I think she is having similar symptoms to this problem that she had in Kramer past.  It does seem to be clinically related more to disc problems rather than arthritic changes.  She has no red flag complaints of weakness or neurogenic problems.  Courtney Kramer  severity of Kramer symptoms recently we will go ahead today completed diagnostic and hopefully therapeutic right L4 transforaminal injection.  We talked about activity modification and core strengthening.  She can use a back brace on occasion but she should wear that all day.  We talked about Kramer importance of lifting mechanics.  She will continue to take ibuprofen as needed.  Depending on relief would consider repeat injection and regrouping with physical therapy.  We talked about Kramer natural history of disc herniations.    Meds & Orders:  Meds  ordered this encounter  Medications  . betamethasone acetate-betamethasone sodium phosphate (CELESTONE) injection 12 mg    Orders Placed This Encounter  Procedures  . XR C-ARM NO REPORT  . Epidural Steroid injection    Follow-up: Return if symptoms worsen or fail to improve.   Procedures: No procedures performed  Lumbosacral Transforaminal Epidural Steroid Injection - Sub-Pedicular Approach with Fluoroscopic Guidance  Patient: Courtney Kramer      Date of Birth: [birth year] MRN: [id number] PCP: Dema Severin, NP      Visit Date: 06/02/2018   Universal Protocol:    Date/Time: 06/02/2018  Consent Given By: Kramer patient  Position: PRONE  Additional Comments: Vital signs were monitored before and after Kramer procedure. Patient was prepped and draped in Kramer usual sterile fashion. Kramer correct patient, procedure, and site was verified.   Injection Procedure Details:  Procedure Site One Meds Administered:  Meds ordered this encounter  Medications  . betamethasone acetate-betamethasone sodium phosphate (CELESTONE) injection 12 mg    Laterality: Right  Location/Site:  L4-L5  Needle size: 22 G  Needle type: Spinal  Needle Placement: Transforaminal  Findings:    -Comments: Excellent flow of contrast along Kramer nerve and into Kramer epidural space.  Procedure Details: After squaring off Kramer end-plates to get a true AP view, Kramer C-arm was positioned so that an oblique view of Kramer foramen as noted above was visualized. Kramer target area is just inferior to Kramer "nose of Kramer scotty dog" or sub pedicular. Kramer soft tissues overlying this structure were infiltrated with 2-3 ml. of 1% Lidocaine without Epinephrine.  Kramer spinal needle was inserted toward Kramer target using a "trajectory" view along Kramer fluoroscope beam.  Under AP and lateral visualization, Kramer needle was advanced so it did not puncture dura and was located close Kramer 6 O'Clock position of Kramer pedical in AP tracterory.  Biplanar projections were used to confirm position. Aspiration was confirmed to be negative for CSF and/or blood. A 1-2 ml. volume of Isovue-250 was injected and flow of contrast was noted at each level. Radiographs were obtained for documentation purposes.   After attaining Kramer desired flow of contrast documented above, a 0.5 to 1.0 ml test dose of 0.25% Marcaine was injected into each respective transforaminal space.  Kramer patient was observed for 90 seconds post injection.  After no sensory deficits were reported, and normal lower extremity motor function was noted,   Kramer above injectate was administered so that equal amounts of Kramer injectate were placed at each foramen (level) into Kramer transforaminal epidural space.   Additional Comments:  Kramer patient tolerated Kramer procedure well Dressing: Band-Aid    Post-procedure details: Patient was observed during Kramer procedure. Post-procedure instructions were reviewed.  Patient left Kramer clinic in stable condition.     Clinical History: Lspine MRI 08/29/2016 IMPRESSION: 1. Symptomatic level favored to be L3-L4 where a small disc herniation affects Kramer right lateral recess. Query right L4 radiculitis.  Borderline to mild overall spinal stenosis at that level. 2. Mild for age lumbar spine degeneration elsewhere. No other convincing neural impingement.   She reports that she has never smoked. She has never used smokeless tobacco. No results for input(s): HGBA1C, LABURIC in Kramer last 8760 hours.  Objective:  VS:  HT:    WT:   BMI:     BP:(!) 149/88  HR:75bpm  TEMP:97.7 F (36.5 C)(Oral)  RESP:  Physical Exam  Constitutional: She is oriented to person, place, and time. She appears well-developed and well-nourished.  Eyes: Pupils are equal, round, and reactive to light. Conjunctivae and EOM are normal.  Cardiovascular: Normal rate and intact distal pulses.  Pulmonary/Chest: Effort normal.  Musculoskeletal:  Patient ambulates without aid.   She has some pain going from sit to stand.  She has no pain over Kramer greater trochanters and no pain with hip rotation.  She has good distal strength and symmetric bilaterally.  Has negative slump test bilaterally.  Neurological: She is alert and oriented to person, place, and time. She exhibits normal muscle tone. Coordination normal.  Skin: Skin is warm and dry. No rash noted. No erythema.  Psychiatric: She has a normal mood and affect. Her behavior is normal.  Nursing note and vitals reviewed.   Ortho Exam Imaging: Xr C-arm No Report  Result Date: 06/02/2018 Please see Notes tab for imaging impression.   Past Medical/Family/Surgical/Social History: Medications & Allergies reviewed per EMR, new medications updated. Patient Active Problem List   Diagnosis Date Noted  . Fracture, Colles, right, closed 04/09/2018  . Closed fracture of right distal radius and ulna, initial encounter 03/27/2018  . Lumbar radiculopathy 09/19/2016  . RENAL CALCULUS 05/04/2008  . FLANK PAIN, RIGHT 05/04/2008   Past Medical History:  Diagnosis Date  . Anxiety   . Arthritis   . Chronic back pain   . Closed fracture of right distal radius   . History of kidney stones   . Renal calculus, right   . Wears glasses    History reviewed. No pertinent family history. Past Surgical History:  Procedure Laterality Date  . COLONOSCOPY  2012  . CYSTO/  RIGHT URETERAL STENT PLACEMENT  05-10-2008  . CYSTOSCOPY W/ URETERAL STENT PLACEMENT Right 06/09/2014   Procedure: CYSTOSCOPY WITH RETROGRADE PYELOGRAM/URETERAL STENT PLACEMENT;  Surgeon: Valetta Fuller, MD;  Location: [facility];  Service: Urology;  Laterality: Right;  . EXTRACORPOREAL SHOCK WAVE LITHOTRIPSY Right 05-10-2008  . KNEE ARTHROSCOPY Left 2009  . OPEN REDUCTION INTERNAL FIXATION (ORIF) DISTAL RADIAL FRACTURE Right 03/27/2018   Procedure: OPEN REDUCTION INTERNAL FIXATION (ORIF) RIGHT DISTAL RADIUS FRACTURE;  Surgeon: Tarry Kos, MD;   Location: [facility];  Service: Orthopedics;  Laterality: Right;  . WISDOM TOOTH EXTRACTION  AGE 53   Social History   Occupational History  . Not on file  Tobacco Use  . Smoking status: Never Smoker  . Smokeless tobacco: Never Used  Substance and Sexual Activity  . Alcohol use: Yes    Comment: OCCASONAL  . Drug use: No  . Sexual activity: Not on file

## 2018-06-25 ENCOUNTER — Ambulatory Visit (INDEPENDENT_AMBULATORY_CARE_PROVIDER_SITE_OTHER): Payer: BLUE CROSS/BLUE SHIELD | Admitting: Orthopaedic Surgery

## 2018-06-25 ENCOUNTER — Ambulatory Visit (INDEPENDENT_AMBULATORY_CARE_PROVIDER_SITE_OTHER): Payer: Self-pay

## 2018-06-25 ENCOUNTER — Encounter (INDEPENDENT_AMBULATORY_CARE_PROVIDER_SITE_OTHER): Payer: Self-pay | Admitting: Orthopaedic Surgery

## 2018-06-25 DIAGNOSIS — S52501A Unspecified fracture of the lower end of right radius, initial encounter for closed fracture: Secondary | ICD-10-CM

## 2018-06-25 DIAGNOSIS — S52601A Unspecified fracture of lower end of right ulna, initial encounter for closed fracture: Secondary | ICD-10-CM

## 2018-06-25 NOTE — Progress Notes (Signed)
Office Visit Note   Patient: Courtney Kramer           Date of Birth: June 24, 1961           MRN: [id number] Visit Date: 06/25/2018              Requested by: Dema Severin, NP [address] [place], [place] [address] PCP: Dema Severin, NP   Assessment & Plan: Visit Diagnoses:  1. Closed fracture of right distal radius and ulna, initial encounter     Plan: Impression is status post ORIF right distal radius fracture.  This fracture has healed and the patient will advance with activity as tolerated.  She will continue working on range of motion and strengthening exercises.  Follow-up as needed.  Follow-Up Instructions: Return if symptoms worsen or fail to improve.   Orders:  Orders Placed This Encounter  Procedures  . XR Wrist 2 Views Right   No orders of the defined types were placed in this encounter.     Procedures: No procedures performed   Clinical Data: No additional findings.   Subjective: Chief Complaint  Patient presents with  . Left Wrist - Follow-up    HPI patient is a pleasant 57 year old female who presents to our clinic today approximately 13 weeks status post ORIF right distal radius fracture.  She has rehabbed this on her own due to financial constraints.  She admits to an occasional sharp pain to the distal radius, but nothing more.  She also notes decreased sensation to all 5 fingertips but does note that this is slowly improving.    Review of Systems as detailed in HPI.  All others reviewed and are negative.   Objective: Vital Signs: There were no vitals taken for this visit.  Physical Exam well developed well-nourished female no acute distress.  Alert and oriented x3.  Ortho Exam Examination of her right wrist reveals a fully healed incision without evidence of infection or cellulitis.  Almost full range of motion of the wrist.  Decreased sensation all 5 fingertips.   Specialty Comments:  No specialty comments available.  Imaging: Xr Wrist 2  Views Right  Result Date: 06/25/2018 X-rays of the right wrist reveal a well-healed fracture with great alignment of the hardware    PMFS History: Patient Active Problem List   Diagnosis Date Noted  . Fracture, [place], right, closed 04/09/2018  . Closed fracture of right distal radius and ulna, initial encounter 03/27/2018  . Lumbar radiculopathy 09/19/2016  . RENAL CALCULUS 05/04/2008  . FLANK PAIN, RIGHT 05/04/2008   Past Medical History:  Diagnosis Date  . Anxiety   . Arthritis   . Chronic back pain   . Closed fracture of right distal radius   . History of kidney stones   . Renal calculus, right   . Wears glasses     No family history on file.  Past Surgical History:  Procedure Laterality Date  . COLONOSCOPY  2012  . CYSTO/  RIGHT URETERAL STENT PLACEMENT  05-10-2008  . CYSTOSCOPY W/ URETERAL STENT PLACEMENT Right 06/09/2014   Procedure: CYSTOSCOPY WITH RETROGRADE PYELOGRAM/URETERAL STENT PLACEMENT;  Surgeon: Valetta Fuller, MD;  Location[facility];  Service: Urology;  Laterality: Right;  . EXTRACORPOREAL SHOCK WAVE LITHOTRIPSY Right 05-10-2008  . KNEE ARTHROSCOPY Left 2009  . OPEN REDUCTION INTERNAL FIXATION (ORIF) DISTAL RADIAL FRACTURE Right 03/27/2018   Procedure: OPEN REDUCTION INTERNAL FIXATION (ORIF) RIGHT DISTAL RADIUS FRACTURE;  Surgeon: Tarry Kos, MD;  Location: [facility];  Service: Orthopedics;  Laterality: Right;  . WISDOM TOOTH EXTRACTION  AGE 64   Social History   Occupational History  . Not on file  Tobacco Use  . Smoking status: Never Smoker  . Smokeless tobacco: Never Used  Substance and Sexual Activity  . Alcohol use: Yes    Comment: OCCASONAL  . Drug use: No  . Sexual activity: Not on file

## 2018-06-26 ENCOUNTER — Ambulatory Visit (INDEPENDENT_AMBULATORY_CARE_PROVIDER_SITE_OTHER): Payer: BLUE CROSS/BLUE SHIELD | Admitting: Orthopaedic Surgery

## 2018-07-04 ENCOUNTER — Other Ambulatory Visit: Payer: Self-pay | Admitting: Obstetrics and Gynecology

## 2018-07-04 DIAGNOSIS — Z1231 Encounter for screening mammogram for malignant neoplasm of breast: Secondary | ICD-10-CM

## 2018-07-10 ENCOUNTER — Telehealth (INDEPENDENT_AMBULATORY_CARE_PROVIDER_SITE_OTHER): Payer: Self-pay | Admitting: Physical Medicine and Rehabilitation

## 2018-07-10 NOTE — Telephone Encounter (Signed)
Shot was absolutely perfect, could try L3 TF instead and eval prior to injection, repeat at same makes no sense if no relief at all

## 2018-07-11 NOTE — Telephone Encounter (Signed)
Scheduled for 07/24/18 at 1445 with driver.

## 2018-07-24 ENCOUNTER — Ambulatory Visit (INDEPENDENT_AMBULATORY_CARE_PROVIDER_SITE_OTHER): Payer: BLUE CROSS/BLUE SHIELD | Admitting: Physical Medicine and Rehabilitation

## 2018-07-24 ENCOUNTER — Encounter (INDEPENDENT_AMBULATORY_CARE_PROVIDER_SITE_OTHER): Payer: Self-pay | Admitting: Physical Medicine and Rehabilitation

## 2018-07-24 ENCOUNTER — Ambulatory Visit (INDEPENDENT_AMBULATORY_CARE_PROVIDER_SITE_OTHER): Payer: Self-pay

## 2018-07-24 VITALS — BP 144/78 | HR 78 | Temp 97.9°F

## 2018-07-24 DIAGNOSIS — M5116 Intervertebral disc disorders with radiculopathy, lumbar region: Secondary | ICD-10-CM

## 2018-07-24 DIAGNOSIS — M5416 Radiculopathy, lumbar region: Secondary | ICD-10-CM | POA: Diagnosis not present

## 2018-07-24 DIAGNOSIS — M25552 Pain in left hip: Secondary | ICD-10-CM | POA: Diagnosis not present

## 2018-07-24 DIAGNOSIS — G8929 Other chronic pain: Secondary | ICD-10-CM

## 2018-07-24 DIAGNOSIS — R202 Paresthesia of skin: Secondary | ICD-10-CM

## 2018-07-24 DIAGNOSIS — M5442 Lumbago with sciatica, left side: Secondary | ICD-10-CM

## 2018-07-24 MED ORDER — BETAMETHASONE SOD PHOS & ACET 6 (3-3) MG/ML IJ SUSP
12.0000 mg | Freq: Once | INTRAMUSCULAR | Status: AC
  Administered 2018-07-24: 12 mg

## 2018-07-24 NOTE — Patient Instructions (Signed)

## 2018-07-24 NOTE — Progress Notes (Signed)
Pt states a sharp numb pain in lower back that radiates into the left leg. Pt states pain started a few weeks ago and it causes her leg to drag at times. Pt states walking makes pain worse, laying flat on back makes pain a little better.  .Numeric Pain Rating Scale and Functional Assessment Average Pain 10   In the last MONTH (on 0-10 scale) has pain interfered with the following?  1. General activity like being  able to carry out your everyday physical activities such as walking, climbing stairs, carrying groceries, or moving a chair?  Rating(8)   +Driver, -BT, -Dye Allergies.

## 2018-07-30 ENCOUNTER — Other Ambulatory Visit (INDEPENDENT_AMBULATORY_CARE_PROVIDER_SITE_OTHER): Payer: Self-pay | Admitting: Physical Medicine and Rehabilitation

## 2018-07-30 DIAGNOSIS — M5416 Radiculopathy, lumbar region: Secondary | ICD-10-CM

## 2018-07-30 DIAGNOSIS — M5116 Intervertebral disc disorders with radiculopathy, lumbar region: Secondary | ICD-10-CM

## 2018-08-13 ENCOUNTER — Ambulatory Visit
Admission: RE | Admit: 2018-08-13 | Discharge: 2018-08-13 | Disposition: A | Discharge status: Home / Self Care | Payer: BLUE CROSS/BLUE SHIELD | Source: Ambulatory Visit | Attending: Obstetrics and Gynecology | Admitting: Obstetrics and Gynecology

## 2018-08-13 DIAGNOSIS — Z1231 Encounter for screening mammogram for malignant neoplasm of breast: Secondary | ICD-10-CM

## 2018-08-13 NOTE — Procedures (Signed)
Lumbosacral Transforaminal Epidural Steroid Injection - Sub-Pedicular Approach with Fluoroscopic Guidance  Patient: Courtney Kramer      Date of Birth: 12/18/1960 MRN: [id number] PCP: Dema Severin, NP      Visit Date: 07/24/2018   Universal Protocol:    Date/Time: 07/24/2018  Consent Given By: the patient  Position: PRONE  Additional Comments: Vital signs were monitored before and after the procedure. Patient was prepped and draped in the usual sterile fashion. The correct patient, procedure, and site was verified.   Injection Procedure Details:  Procedure Site One Meds Administered:  Meds ordered this encounter  Medications  . betamethasone acetate-betamethasone sodium phosphate (CELESTONE) injection 12 mg    Laterality: Left  Location/Site:  L5-S1  Needle size: 22 G  Needle type: Spinal  Needle Placement: Transforaminal  Findings:    -Comments: Excellent flow of contrast along the nerve and into the epidural space.  Procedure Details: After squaring off the end-plates to get a true AP view, the C-arm was positioned so that an oblique view of the foramen as noted above was visualized. The target area is just inferior to the "nose of the scotty dog" or sub pedicular. The soft tissues overlying this structure were infiltrated with 2-3 ml. of 1% Lidocaine without Epinephrine.  The spinal needle was inserted toward the target using a "trajectory" view along the fluoroscope beam.  Under AP and lateral visualization, the needle was advanced so it did not puncture dura and was located close the 6 O'Clock position of the pedical in AP tracterory. Biplanar projections were used to confirm position. Aspiration was confirmed to be negative for CSF and/or blood. A 1-2 ml. volume of Isovue-250 was injected and flow of contrast was noted at each level. Radiographs were obtained for documentation purposes.   After attaining the desired flow of contrast documented above, a 0.5 to  1.0 ml test dose of 0.25% Marcaine was injected into each respective transforaminal space.  The patient was observed for 90 seconds post injection.  After no sensory deficits were reported, and normal lower extremity motor function was noted,   the above injectate was administered so that equal amounts of the injectate were placed at each foramen (level) into the transforaminal epidural space.   Additional Comments:  The patient tolerated the procedure well Dressing: Band-Aid    Post-procedure details: Patient was observed during the procedure. Post-procedure instructions were reviewed.  Patient left the clinic in stable condition.

## 2018-08-13 NOTE — Progress Notes (Signed)
Courtney Kramer - 57 y.o. female MRN [id number]  Date of birth: 1961-08-03  Office Visit Note: Visit Date: 07/24/2018 PCP: Dema Severin, NP Referred by: Dema Severin, NP  Subjective: Chief Complaint  Patient presents with  . Lower Back - Pain, Numbness  . Left Leg - Numbness, Pain  . Left Hip - Pain   HPI: Courtney Kramer is a 57 y.o. female who comes in today A few weeks after we completed a right L4 transforaminal injection for what was a disc herniation at L3-4 affecting the lateral recess and L4 nerve root.  This is from an MRI from 2017.  Prior injection at this level gave her good relief.  She phoned back into the office stating that the injection did not help at all.  She is very distraught with the amount of pain that she is having she rates her pain as a 10 out of 10.  She has a chronic pain history.  Interestingly today even though it is hard to convince her she reports that all the pain today is in her left leg but yet when we saw her last time we completed a right sided injection for what was right sided leg pain well-documented.  She feels like the pain is always been on the left side.  Prior injection when I first saw her was again right-sided.  MRI does not show any left-sided pathology.  She reports the left leg pain started a few weeks ago.  Poor historian at this point but today at least in for the last week or 2 it has been left-sided pain down the leg.  She reports her leg is dragging.  She reports worsening with walking and laying flat on her back makes a little bit better.  Medications have not helped.  She still under the care of Dr. Glee Arvin for distal radius fracture.  She has not had any new injuries or new complaints.  No bowel or bladder dysfunction.  No neurogenic symptoms otherwise.  No fevers chills or night sweats or night pain.  Review of Systems  Constitutional: Negative for chills, fever, malaise/fatigue and weight loss.  HENT: Negative for hearing loss  and sinus pain.   Eyes: Negative for blurred vision, double vision and photophobia.  Respiratory: Negative for cough and shortness of breath.   Cardiovascular: Negative for chest pain, palpitations and leg swelling.  Gastrointestinal: Negative for abdominal pain, nausea and vomiting.  Genitourinary: Negative for flank pain.  Musculoskeletal: Positive for back pain and joint pain. Negative for myalgias.       Left leg pain  Skin: Negative for itching and rash.  Neurological: Negative for tremors, focal weakness and weakness.  Endo/Heme/Allergies: Negative.   Psychiatric/Behavioral: Negative for depression.  All other systems reviewed and are negative.  Otherwise per HPI.  Assessment & Plan: Visit Diagnoses:  1. Lumbar radiculopathy   2. Radiculopathy due to lumbar intervertebral disc disorder   3. Chronic left-sided low back pain with left-sided sciatica   4. Pain in left hip   5. Paresthesia of skin     Plan: Findings:  Chronic severe left radicular type leg pain more of an L5 distribution in general status post injection few weeks ago but that was on the right side.  She complains of no right-sided leg complaints.  Poor historian that we documented well on multiple staff members that it was right sided pain at the last visit and we did complete a repeat of a  right L4 transforaminal injection that helped in the past.  She was adamant at the time that that was the same pain as before.  Nonetheless left radicular type pain severe she rates as a 10 out of 10.  She reports no medications are helping.  At this point had been on it was the opposite side I probably would have regrouped with MRI of the lumbar spine but I think at this point since she is here and the pain is severe we will try a simple left L5 transforaminal epidural steroid injection.  If that does not help at all then we do not obtain MRI of the lumbar spine.  Would consider physical therapy regrouping for symptomatic piriformis  syndrome.  Exam is nonfocal.    Meds & Orders:  Meds ordered this encounter  Medications  . betamethasone acetate-betamethasone sodium phosphate (CELESTONE) injection 12 mg    Orders Placed This Encounter  Procedures  . XR C-ARM NO REPORT  . Epidural Steroid injection    Follow-up: Return for MRI review after completion.   Procedures: No procedures performed  Lumbosacral Transforaminal Epidural Steroid Injection - Sub-Pedicular Approach with Fluoroscopic Guidance  Patient: Courtney Kramer      Date of Birth: 09-28-1960 MRN: [id number] PCP: Dema Severin, NP      Visit Date: 07/24/2018   Universal Protocol:    Date/Time: 07/24/2018  Consent Given By: the patient  Position: PRONE  Additional Comments: Vital signs were monitored before and after the procedure. Patient was prepped and draped in the usual sterile fashion. The correct patient, procedure, and site was verified.   Injection Procedure Details:  Procedure Site One Meds Administered:  Meds ordered this encounter  Medications  . betamethasone acetate-betamethasone sodium phosphate (CELESTONE) injection 12 mg    Laterality: Left  Location/Site:  L5-S1  Needle size: 22 G  Needle type: Spinal  Needle Placement: Transforaminal  Findings:    -Comments: Excellent flow of contrast along the nerve and into the epidural space.  Procedure Details: After squaring off the end-plates to get a true AP view, the C-arm was positioned so that an oblique view of the foramen as noted above was visualized. The target area is just inferior to the "nose of the scotty dog" or sub pedicular. The soft tissues overlying this structure were infiltrated with 2-3 ml. of 1% Lidocaine without Epinephrine.  The spinal needle was inserted toward the target using a "trajectory" view along the fluoroscope beam.  Under AP and lateral visualization, the needle was advanced so it did not puncture dura and was located close the 6 O'Clock  position of the pedical in AP tracterory. Biplanar projections were used to confirm position. Aspiration was confirmed to be negative for CSF and/or blood. A 1-2 ml. volume of Isovue-250 was injected and flow of contrast was noted at each level. Radiographs were obtained for documentation purposes.   After attaining the desired flow of contrast documented above, a 0.5 to 1.0 ml test dose of 0.25% Marcaine was injected into each respective transforaminal space.  The patient was observed for 90 seconds post injection.  After no sensory deficits were reported, and normal lower extremity motor function was noted,   the above injectate was administered so that equal amounts of the injectate were placed at each foramen (level) into the transforaminal epidural space.   Additional Comments:  The patient tolerated the procedure well Dressing: Band-Aid    Post-procedure details: Patient was observed during the procedure. Post-procedure instructions were  reviewed.  Patient left the clinic in stable condition.     Clinical History: Lspine MRI 08/29/2016 IMPRESSION: 1. Symptomatic level favored to be L3-L4 where a small disc herniation affects the right lateral recess. Query right L4 radiculitis. Borderline to mild overall spinal stenosis at that level. 2. Mild for age lumbar spine degeneration elsewhere. No other convincing neural impingement.   She reports that she has never smoked. She has never used smokeless tobacco. No results for input(s): HGBA1C, LABURIC in the last 8760 hours.  Objective:  VS:  HT:    WT:   BMI:     BP:(!) 144/78  HR:78bpm  TEMP:97.9 F (36.6 C)(Oral)  RESP:  Physical Exam  Constitutional: She is oriented to person, place, and time. She appears well-developed and well-nourished.  Eyes: Pupils are equal, round, and reactive to light. Conjunctivae and EOM are normal.  Cardiovascular: Normal rate and intact distal pulses.  Pulmonary/Chest: Effort normal.    Musculoskeletal: She exhibits no edema.  Patient ambulates without aid with fairly normal gait.  She does have pain with extension and facet loading of the lumbar spine.  No pain over the greater trochanter no pain with hip rotation.  She has good distal strength.  Neurological: She is alert and oriented to person, place, and time. She exhibits normal muscle tone. Coordination normal.  Skin: Skin is warm and dry. No rash noted. No erythema.  Psychiatric: She has a normal mood and affect. Her behavior is normal.  Nursing note and vitals reviewed.   Ortho Exam Imaging: No results found.  Past Medical/Family/Surgical/Social History: Medications & Allergies reviewed per EMR, new medications updated. Patient Active Problem List   Diagnosis Date Noted  . Fracture, [place], right, closed 04/09/2018  . Closed fracture of right distal radius and ulna, initial encounter 03/27/2018  . Lumbar radiculopathy 09/19/2016  . RENAL CALCULUS 05/04/2008  . FLANK PAIN, RIGHT 05/04/2008   Past Medical History:  Diagnosis Date  . Anxiety   . Arthritis   . Chronic back pain   . Closed fracture of right distal radius   . History of kidney stones   . Renal calculus, right   . Wears glasses    History reviewed. No pertinent family history. Past Surgical History:  Procedure Laterality Date  . COLONOSCOPY  2012  . CYSTO/  RIGHT URETERAL STENT PLACEMENT  05-10-2008  . CYSTOSCOPY W/ URETERAL STENT PLACEMENT Right 06/09/2014   Procedure: CYSTOSCOPY WITH RETROGRADE PYELOGRAM/URETERAL STENT PLACEMENT;  Surgeon: Valetta Fuller, MD;  Location: [facility];  Service: Urology;  Laterality: Right;  . EXTRACORPOREAL SHOCK WAVE LITHOTRIPSY Right 05-10-2008  . KNEE ARTHROSCOPY Left 2009  . OPEN REDUCTION INTERNAL FIXATION (ORIF) DISTAL RADIAL FRACTURE Right 03/27/2018   Procedure: OPEN REDUCTION INTERNAL FIXATION (ORIF) RIGHT DISTAL RADIUS FRACTURE;  Surgeon: Tarry Kos, MD;  Location: [facility];  Service: Orthopedics;  Laterality: Right;  . WISDOM TOOTH EXTRACTION  AGE 63   Social History   Occupational History  . Not on file  Tobacco Use  . Smoking status: Never Smoker  . Smokeless tobacco: Never Used  Substance and Sexual Activity  . Alcohol use: Yes    Comment: OCCASONAL  . Drug use: No  . Sexual activity: Not on file

## 2018-08-20 ENCOUNTER — Ambulatory Visit
Admission: RE | Admit: 2018-08-20 | Discharge: 2018-08-20 | Disposition: A | Discharge status: Home / Self Care | Payer: BLUE CROSS/BLUE SHIELD | Source: Ambulatory Visit | Attending: Physical Medicine and Rehabilitation | Admitting: Physical Medicine and Rehabilitation

## 2018-08-20 DIAGNOSIS — M5416 Radiculopathy, lumbar region: Secondary | ICD-10-CM

## 2018-08-20 DIAGNOSIS — M5116 Intervertebral disc disorders with radiculopathy, lumbar region: Secondary | ICD-10-CM

## 2018-08-21 ENCOUNTER — Other Ambulatory Visit: Payer: BLUE CROSS/BLUE SHIELD

## 2019-04-01 ENCOUNTER — Ambulatory Visit: Payer: Self-pay | Admitting: Orthopaedic Surgery

## 2019-04-02 ENCOUNTER — Ambulatory Visit: Payer: BLUE CROSS/BLUE SHIELD | Admitting: Orthopaedic Surgery

## 2019-04-08 ENCOUNTER — Ambulatory Visit (INDEPENDENT_AMBULATORY_CARE_PROVIDER_SITE_OTHER): Payer: Managed Care, Other (non HMO)

## 2019-04-08 ENCOUNTER — Other Ambulatory Visit: Payer: Self-pay

## 2019-04-08 ENCOUNTER — Encounter: Payer: Self-pay | Admitting: Orthopaedic Surgery

## 2019-04-08 ENCOUNTER — Ambulatory Visit (INDEPENDENT_AMBULATORY_CARE_PROVIDER_SITE_OTHER): Payer: Managed Care, Other (non HMO) | Admitting: Orthopaedic Surgery

## 2019-04-08 DIAGNOSIS — M1712 Unilateral primary osteoarthritis, left knee: Secondary | ICD-10-CM

## 2019-04-08 MED ORDER — LIDOCAINE HCL 1 % IJ SOLN
2.0000 mL | INTRAMUSCULAR | Status: AC | PRN
  Administered 2019-04-08: 2 mL

## 2019-04-08 MED ORDER — BUPIVACAINE HCL 0.25 % IJ SOLN
2.0000 mL | INTRAMUSCULAR | Status: AC | PRN
  Administered 2019-04-08: 2 mL via INTRA_ARTICULAR

## 2019-04-08 MED ORDER — METHYLPREDNISOLONE ACETATE 40 MG/ML IJ SUSP
40.0000 mg | INTRAMUSCULAR | Status: AC | PRN
  Administered 2019-04-08: 40 mg via INTRA_ARTICULAR

## 2019-04-08 NOTE — Progress Notes (Signed)
Office Visit Note   Patient: Courtney Kramer           Date of Birth: [birth year]           MRN: [id number] Visit Date: 04/08/2019              Requested by: Imagene Riches, NP [address],  [place] [address] PCP: Imagene Riches, NP   Assessment & Plan: Visit Diagnoses:  1. Primary osteoarthritis of left knee     Plan: Pression is left knee arthritis flareup.  We will inject this with cortisone today.  She will follow-up with [place] as needed.  Call with concerns or questions in the meantime.  Follow-Up Instructions: Return if symptoms worsen or fail to improve.   Orders:  Orders Placed This Encounter  Procedures  . Large Joint Inj: L knee  . XR KNEE 3 VIEW LEFT   No orders of the defined types were placed in this encounter.     Procedures: Large Joint Inj: L knee on 04/08/2019 4:37 PM Indications: pain Details: 22 G needle, anterolateral approach Medications: 2 mL bupivacaine 0.25 %; 2 mL lidocaine 1 %; 40 mg methylPREDNISolone acetate 40 MG/ML      Clinical Data: No additional findings.   Subjective: Chief Complaint  Patient presents with  . Left Knee - Pain    HPI patient is a pleasant 58 year old female who presents our clinic today with left knee pain.  This has been bothering her for the past year or so.  She notes that she took a fall about a year ago which may have caused onset of her pain.  The pain she has is primarily retropatellar.  She describes this as a sharp pain when she steps and holes is feels on the former when she is walking on uneven grounds.  She denies any mechanical symptoms.  No instability.  She has been taking Celebrex with mild relief of symptoms.  Of note, she is status post left knee arthroscopic debridement by Dr. Lorin Mercy approximately 15 years ago.  Doing well until last year's event.  Review of Systems as detailed in HPI.  All others reviewed and are negative.   Objective: Vital Signs: There were no vitals taken for this visit.   Physical Exam well-developed and well-nourished female no acute distress.  Alert and oriented x3.  Ortho Exam examination of the left knee reveals a trace effusion.  Range of motion 0 to 125 degrees.  Mild medial joint line tenderness.  Mild patellofemoral crepitus.  Ligaments are stable.  She is neurovascular intact distally.  Specialty Comments:  No specialty comments available.  Imaging: Xr Knee 3 View Left  Result Date: 04/08/2019 Mild to moderate joint space narrowing medial and patellofemoral compartments    PMFS History: Patient Active Problem List   Diagnosis Date Noted  . Fracture, [place], right, closed 04/09/2018  . Closed fracture of right distal radius and ulna, initial encounter 03/27/2018  . Lumbar radiculopathy 09/19/2016  . RENAL CALCULUS 05/04/2008  . FLANK PAIN, RIGHT 05/04/2008   Past Medical History:  Diagnosis Date  . Anxiety   . Arthritis   . Chronic back pain   . Closed fracture of right distal radius   . History of kidney stones   . Renal calculus, right   . Wears glasses     Family History  Problem Relation Age of Onset  . Breast cancer Neg Hx     Past Surgical History:  Procedure  Laterality Date  . COLONOSCOPY  2012  . CYSTO/  RIGHT URETERAL STENT PLACEMENT  05-10-2008  . CYSTOSCOPY W/ URETERAL STENT PLACEMENT Right 06/09/2014   Procedure: CYSTOSCOPY WITH RETROGRADE PYELOGRAM/URETERAL STENT PLACEMENT;  Surgeon: Valetta Fuller, MD;  Location: [facility];  Service: Urology;  Laterality: Right;  . EXTRACORPOREAL SHOCK WAVE LITHOTRIPSY Right 05-10-2008  . KNEE ARTHROSCOPY Left 2009  . OPEN REDUCTION INTERNAL FIXATION (ORIF) DISTAL RADIAL FRACTURE Right 03/27/2018   Procedure: OPEN REDUCTION INTERNAL FIXATION (ORIF) RIGHT DISTAL RADIUS FRACTURE;  Surgeon: Tarry Kos, MD;  Location: [facility];  Service: Orthopedics;  Laterality: Right;  . WISDOM TOOTH EXTRACTION  AGE 24   Social History   Occupational History   . Not on file  Tobacco Use  . Smoking status: Never Smoker  . Smokeless tobacco: Never Used  Substance and Sexual Activity  . Alcohol use: Yes    Comment: OCCASONAL  . Drug use: No  . Sexual activity: Not on file

## 2019-07-08 NOTE — Progress Notes (Signed)
Office Visit Note  Patient: Courtney Kramer             Date of Birth: 20-Mar-1961           MRN: [id number]             PCP: Imagene Riches, NP Referring: Courtney Queen, MD Visit Date: 07/22/2019 Occupation: Hair stylist  Subjective:  New Patient (Initial Visit) (Abnormal labs)   History of Present Illness: Courtney Kramer is a 58 y.o. female seen in consultation per request of Dr. Helane Rima. She is a previous patient of mine and was seen last in the office on September 30, 2014.  She was under my treatment for her osteoarthritis in her hands and feet.  She also has a history of plantar fasciitis.  She has positive ANA but negative titer and all other autoimmune work-up was negative.  Patient states that she has been having some lower back pain and was seen by Dr.Chu.  She will also had injections by Dr. Ernestina Patches and her back has been better.  About 5 months ago she started having increased left knee joint pain.  She had x-rays done and was told that she mostly have patellofemoral syndrome.  She had a cortisone injection which is feeling better.  She states for the last month she has been having increased right knee joint pain.  She is having difficulty walking and constant pain in her knee joint.  She continues to have some discomfort in her hands.  Activities of Daily Living:  Patient reports morning stiffness for 0 none.   Patient Denies nocturnal pain.  Difficulty dressing/grooming: Denies Difficulty climbing stairs: Reports Difficulty getting out of chair: Reports Difficulty using hands for taps, buttons, cutlery, and/or writing: Denies  Review of Systems  Constitutional: Negative for fatigue, night sweats, weight gain and weight loss.  HENT: Negative for mouth sores, trouble swallowing, trouble swallowing, mouth dryness and nose dryness.   Eyes: Positive for dryness. Negative for pain, redness and visual disturbance.  Respiratory: Negative for cough, shortness of breath and  difficulty breathing.   Cardiovascular: Negative for chest pain, palpitations, hypertension, irregular heartbeat and swelling in legs/feet.  Gastrointestinal: Negative for blood in stool, constipation and diarrhea.  Endocrine: Negative for increased urination.  Genitourinary: Negative for difficulty urinating and vaginal dryness.  Musculoskeletal: Positive for arthralgias, gait problem, joint pain, joint swelling, muscle weakness and muscle tenderness. Negative for myalgias, morning stiffness and myalgias.  Skin: Negative for color change, rash, hair loss, skin tightness, ulcers and sensitivity to sunlight.  Allergic/Immunologic: Negative for susceptible to infections.  Neurological: Negative for dizziness, memory loss, night sweats and weakness.  Hematological: Negative for bruising/bleeding tendency and swollen glands.  Psychiatric/Behavioral: Positive for depressed mood and sleep disturbance. The patient is not nervous/anxious.     PMFS History:  Patient Active Problem List   Diagnosis Date Noted  . Fracture, Colles, right, closed 04/09/2018  . Closed fracture of right distal radius and ulna, initial encounter 03/27/2018  . Lumbar radiculopathy 09/19/2016  . RENAL CALCULUS 05/04/2008  . FLANK PAIN, RIGHT 05/04/2008    Past Medical History:  Diagnosis Date  . Anxiety   . Arthritis   . Chronic back pain   . Closed fracture of right distal radius   . History of kidney stones   . Renal calculus, right   . Wears glasses     Family History  Problem Relation Age of Onset  . Fibromyalgia Mother   . Heart disease  Mother   . Macular degeneration Mother   . Rheum arthritis Father   . Rheum arthritis Sister   . Cancer Brother   . Breast cancer Neg Hx    Past Surgical History:  Procedure Laterality Date  . COLONOSCOPY  2012  . CYSTO/  RIGHT URETERAL STENT PLACEMENT  05-10-2008  . CYSTOSCOPY W/ URETERAL STENT PLACEMENT Right 06/09/2014   Procedure: CYSTOSCOPY WITH RETROGRADE  PYELOGRAM/URETERAL STENT PLACEMENT;  Surgeon: Bernestine Amass, MD;  Location: [facility];  Service: Urology;  Laterality: Right;  . EXTRACORPOREAL SHOCK WAVE LITHOTRIPSY Right 05-10-2008  . KNEE ARTHROSCOPY Left 2009  . OPEN REDUCTION INTERNAL FIXATION (ORIF) DISTAL RADIAL FRACTURE Right 03/27/2018   Procedure: OPEN REDUCTION INTERNAL FIXATION (ORIF) RIGHT DISTAL RADIUS FRACTURE;  Surgeon: Leandrew Koyanagi, MD;  Location: [facility];  Service: Orthopedics;  Laterality: Right;  . WISDOM TOOTH EXTRACTION  AGE 11   Social History   Social History Narrative  . Not on file    There is no immunization history on file for this patient.   Objective: Vital Signs: BP (!) 147/80 (BP Location: Right Arm, Patient Position: Sitting, Cuff Size: Normal)   Pulse 77   Resp 16   Ht 5' 6" (1.676 m)   Wt 187 lb (84.8 kg)   BMI 30.18 kg/m    Physical Exam Vitals signs and nursing note reviewed.  Constitutional:      Appearance: She is well-developed.  HENT:     Head: Normocephalic and atraumatic.  Eyes:     Conjunctiva/sclera: Conjunctivae normal.  Neck:     Musculoskeletal: Normal range of motion.  Cardiovascular:     Rate and Rhythm: Normal rate and regular rhythm.     Heart sounds: Normal heart sounds.  Pulmonary:     Effort: Pulmonary effort is normal.     Breath sounds: Normal breath sounds.  Abdominal:     General: Bowel sounds are normal.     Palpations: Abdomen is soft.  Lymphadenopathy:     Cervical: No cervical adenopathy.  Skin:    General: Skin is warm and dry.     Capillary Refill: Capillary refill takes less than 2 seconds.  Neurological:     Mental Status: She is alert and oriented to person, place, and time.  Psychiatric:        Behavior: Behavior normal.      Musculoskeletal Exam: C-spine was in good range of motion.  She had good range of motion of lumbar spine with some discomfort.  She had no SI joint tenderness.  Shoulder joints, elbow  joints with good range of motion.  She had good range of motion of her wrist joints with no synovitis.  PIP and DIP thickening was noted.  No synovitis was noted over MCP joints.  She had good range of motion of bilateral hip joints.  She had some warmth in her left knee and warmth and swelling in her right knee.  She had good range of motion of her ankle joints.  Bilateral first MTP PIP and DIP thickening was noted consistent with osteoarthritis.  CDAI Exam: CDAI Score: - Patient Global: -; Provider Global: - Swollen: -; Tender: - Joint Exam   No joint exam has been documented for this visit   There is currently no information documented on the homunculus. Go to the Rheumatology activity and complete the homunculus joint exam.  Investigation: No additional findings.  Imaging: Xr Hand 2 View Left  Result Date: 07/22/2019  PIP and DIP narrowing was noted.  No MCP, intercarpal radiocarpal joint space narrowing was noted.  No erosive changes were noted. Impression: These findings are consistent with osteoarthritis of the hand.  Xr Hand 2 View Right  Result Date: 07/22/2019 PIP and DIP narrowing was noted.  No MCP, intercarpal radiocarpal joint space narrowing was noted.  Hardware is present in the radius due to previous fracture. Impression: These findings are consistent with osteoarthritis of the hand.  Xr Knee 3 View Right  Result Date: 07/22/2019 Mild to moderate medial compartment narrowing was noted.  No chondrocalcinosis was noted.  Mild patellofemoral narrowing was noted. Impression: These findings are consistent with mild to moderate osteoarthritis and mild chondromalacia patella.   Recent Labs: Lab Results  Component Value Date   HGB 15.0 06/09/2014   June 05, 2019 comprehensive metabolic panel normal, CBC normal, UA showed trace blood, LDL 154, TSH normal, hemoglobin A1c 5.4, RF 18.6 (0-13.4) Speciality Comments: No specialty comments available.  Procedures:  Large  Joint Inj: R knee on 07/22/2019 8:40 AM Indications: pain Details: 27 G 1.5 in needle, medial approach  Arthrogram: No  Medications: 40 mg triamcinolone acetonide 40 MG/ML; 1.5 mL lidocaine 1 % Aspirate: 0 mL Outcome: tolerated well, no immediate complications Procedure, treatment alternatives, risks and benefits explained, specific risks discussed. Consent was given by the patient. Immediately prior to procedure a time out was called to verify the correct patient, procedure, equipment, support staff and site/side marked as required. Patient was prepped and draped in the usual sterile fashion.     Allergies: Sulfa antibiotics   Assessment / Plan:     Visit Diagnoses: Acute pain of right knee -patient has pain and swelling in her right knee joint.  She is having difficulty walking and she is limping.  X-ray is consistent with mild to moderate osteoarthritis and mild chondromalacia patella.  She also has low titer rheumatoid factor.  There is a strong family history of rheumatoid arthritis.  I will obtain additional labs today.  She has lot of discomfort right knee joint was injected with cortisone as described above.  She tolerated the procedure well.  Plan: XR KNEE 3 VIEW RIGHT, Sedimentation rate, Uric acid, Cyclic citrul peptide antibody, IgG, 14-3-3 eta Protein, ANA, Angiotensin converting enzyme, HLA-B27 antigen, Glucose 6 phosphate dehydrogenase  Rheumatoid factor positive - 06/04/19: RF 18.6  Pain in both hands -she has DIP and PIP thickening.  No synovitis was noted.  The clinical and radiographic findings are consistent with osteoarthritis of the hands.  Plan: XR Hand 2 View Right, XR Hand 2 View Left  Primary osteoarthritis of both hands  Primary osteoarthritis of left knee - X-rays done by Dr. Erlinda Hong showed mild to moderate osteoarthritis and chondromalacia patella.  She still have some warmth in her left knee joint.  She had injection to her left knee joint in July.  Primary  osteoarthritis of both feet-clinical findings are consistent with osteoarthritis with bilateral first MTP, PIP and DIP thickening.  No synovitis was noted.  DDD (degenerative disc disease), lumbar -I reviewed MRI of her lumbar spine.  Patient had injections by Dr. Ernestina Patches.  High risk medication use -in anticipation to start her treatment in the future I will obtain following labs today.  Plan: Hepatitis B core antibody, IgM, Hepatitis B surface antigen, Hepatitis C antibody, HIV Antibody (routine testing w rflx), QuantiFERON-TB Gold Plus, Serum protein electrophoresis with reflex, IgG, IgA, IgM  Other fatigue - Plan: CK  Family history of  rheumatoid arthritis - Father, sister, paternal grand father  Closed Colles' fracture of right radius with routine healing, subsequent encounter  RENAL CALCULUS-I will check uric acid.  I am uncertain what kind of kidney stone she has had in the past.  Orders: Orders Placed This Encounter  Procedures  . Large Joint Inj  . XR KNEE 3 VIEW RIGHT  . XR Hand 2 View Right  . XR Hand 2 View Left  . Sedimentation rate  . CK  . Uric acid  . Cyclic citrul peptide antibody, IgG  . 14-3-3 eta Protein  . ANA  . Angiotensin converting enzyme  . HLA-B27 antigen  . Glucose 6 phosphate dehydrogenase  . Hepatitis B core antibody, IgM  . Hepatitis B surface antigen  . Hepatitis C antibody  . HIV Antibody (routine testing w rflx)  . QuantiFERON-TB Gold Plus  . Serum protein electrophoresis with reflex  . IgG, IgA, IgM   No orders of the defined types were placed in this encounter.   Face-to-face time spent with patient was 55 minutes. Greater than 50% of time was spent in counseling and coordination of care.  Follow-Up Instructions: Return for Inflammatory arthritis, positive rheumatoid factor.   Bo Merino, MD  Note - This record has been created using Editor, commissioning.  Chart creation errors have been sought, but may not always  have been  located. Such creation errors do not reflect on  the standard of medical care.

## 2019-07-22 ENCOUNTER — Encounter: Payer: Self-pay | Admitting: Rheumatology

## 2019-07-22 ENCOUNTER — Ambulatory Visit: Payer: Self-pay

## 2019-07-22 ENCOUNTER — Other Ambulatory Visit: Payer: Self-pay

## 2019-07-22 ENCOUNTER — Ambulatory Visit: Payer: Managed Care, Other (non HMO) | Admitting: Rheumatology

## 2019-07-22 ENCOUNTER — Ambulatory Visit (INDEPENDENT_AMBULATORY_CARE_PROVIDER_SITE_OTHER): Payer: Managed Care, Other (non HMO)

## 2019-07-22 VITALS — BP 147/80 | HR 77 | Resp 16 | Ht 66.0 in | Wt 187.0 lb

## 2019-07-22 DIAGNOSIS — Z8261 Family history of arthritis: Secondary | ICD-10-CM

## 2019-07-22 DIAGNOSIS — M19041 Primary osteoarthritis, right hand: Secondary | ICD-10-CM

## 2019-07-22 DIAGNOSIS — M5136 Other intervertebral disc degeneration, lumbar region: Secondary | ICD-10-CM

## 2019-07-22 DIAGNOSIS — M79642 Pain in left hand: Secondary | ICD-10-CM | POA: Diagnosis not present

## 2019-07-22 DIAGNOSIS — M51369 Other intervertebral disc degeneration, lumbar region without mention of lumbar back pain or lower extremity pain: Secondary | ICD-10-CM

## 2019-07-22 DIAGNOSIS — M25561 Pain in right knee: Secondary | ICD-10-CM | POA: Diagnosis not present

## 2019-07-22 DIAGNOSIS — M79641 Pain in right hand: Secondary | ICD-10-CM

## 2019-07-22 DIAGNOSIS — Z79899 Other long term (current) drug therapy: Secondary | ICD-10-CM

## 2019-07-22 DIAGNOSIS — N200 Calculus of kidney: Secondary | ICD-10-CM

## 2019-07-22 DIAGNOSIS — R768 Other specified abnormal immunological findings in serum: Secondary | ICD-10-CM

## 2019-07-22 DIAGNOSIS — M19071 Primary osteoarthritis, right ankle and foot: Secondary | ICD-10-CM

## 2019-07-22 DIAGNOSIS — M19072 Primary osteoarthritis, left ankle and foot: Secondary | ICD-10-CM

## 2019-07-22 DIAGNOSIS — M5416 Radiculopathy, lumbar region: Secondary | ICD-10-CM

## 2019-07-22 DIAGNOSIS — M2550 Pain in unspecified joint: Secondary | ICD-10-CM

## 2019-07-22 DIAGNOSIS — R5383 Other fatigue: Secondary | ICD-10-CM

## 2019-07-22 DIAGNOSIS — S52531D Colles' fracture of right radius, subsequent encounter for closed fracture with routine healing: Secondary | ICD-10-CM

## 2019-07-22 DIAGNOSIS — M1712 Unilateral primary osteoarthritis, left knee: Secondary | ICD-10-CM

## 2019-07-22 DIAGNOSIS — M19042 Primary osteoarthritis, left hand: Secondary | ICD-10-CM

## 2019-07-22 DIAGNOSIS — M791 Myalgia, unspecified site: Secondary | ICD-10-CM

## 2019-07-22 MED ORDER — LIDOCAINE HCL 1 % IJ SOLN
1.5000 mL | INTRAMUSCULAR | Status: AC | PRN
  Administered 2019-07-22: 1.5 mL

## 2019-07-22 MED ORDER — TRIAMCINOLONE ACETONIDE 40 MG/ML IJ SUSP
40.0000 mg | INTRAMUSCULAR | Status: AC | PRN
  Administered 2019-07-22: 40 mg via INTRA_ARTICULAR

## 2019-07-24 LAB — HIV ANTIBODY (ROUTINE TESTING W REFLEX): HIV 1&2 Ab, 4th Generation: NONREACTIVE

## 2019-07-24 LAB — QUANTIFERON-TB GOLD PLUS
Mitogen-NIL: >10 IU/mL
NIL: 0.03 IU/mL
QuantiFERON-TB Gold Plus: NEGATIVE
TB1-NIL: 0 IU/mL
TB2-NIL: 0.02 IU/mL

## 2019-07-24 LAB — PROTEIN ELECTROPHORESIS, SERUM, WITH REFLEX
Albumin ELP: 4 g/dL (ref 3.8–4.8)
Alpha 1: 0.3 g/dL (ref 0.2–0.3)
Alpha 2: 0.8 g/dL (ref 0.5–0.9)
Beta 2: 0.3 g/dL (ref 0.2–0.5)
Beta Globulin: 0.4 g/dL (ref 0.4–0.6)
Gamma Globulin: 1 g/dL (ref 0.8–1.7)
Total Protein: 6.8 g/dL (ref 6.1–8.1)

## 2019-07-24 LAB — HEPATITIS B SURFACE ANTIGEN: Hepatitis B Surface Ag: NONREACTIVE

## 2019-07-24 LAB — IGG, IGA, IGM
IgG (Immunoglobin G), Serum: 937 mg/dL (ref 600–1640)
IgM, Serum: 335 mg/dL — ABNORMAL HIGH (ref 50–300)
Immunoglobulin A: 157 mg/dL (ref 47–310)

## 2019-07-24 LAB — HEPATITIS C ANTIBODY
Hepatitis C Ab: NONREACTIVE
SIGNAL TO CUT-OFF: 0.01 (ref <1.00)

## 2019-07-24 LAB — HEPATITIS B CORE ANTIBODY, IGM: Hep B C IgM: NONREACTIVE

## 2019-07-24 NOTE — Progress Notes (Signed)
Office Visit Note  Patient: Courtney Kramer             Date of Birth: [birth year]           MRN: [id number]             PCP: Imagene Riches, NP Referring: Imagene Riches, NP Visit Date: 07/31/2019 Occupation: _0 @  Subjective:  Follow-up (Bil knee pain, left worse than right, wants injection)   History of Present Illness: Courtney Kramer is a 58 y.o. female returns after her initial visit.  She states she continues to have pain and discomfort in her bilateral knee joints.  She has been also having some discomfort in her hands.  She had good response to cortisone injection.  None of the other joints are painful.  I injected her right knee on July 22, 2019.  She states she had good response to the injection and it still have some discomfort.  She had left knee joint injection in June July 2020 and had good response to it.  But now the pain has recurred.  Activities of Daily Living:  Patient reports morning stiffness for 2 minutes.   Patient Reports nocturnal pain.  Difficulty dressing/grooming: Denies Difficulty climbing stairs: Reports Difficulty getting out of chair: Reports Difficulty using hands for taps, buttons, cutlery, and/or writing: Denies  Review of Systems  Constitutional: Positive for fatigue. Negative for night sweats, weight gain and weight loss.  HENT: Negative for mouth sores, trouble swallowing, trouble swallowing, mouth dryness and nose dryness.   Eyes: Negative for pain, redness, visual disturbance and dryness.  Respiratory: Negative for cough, shortness of breath and difficulty breathing.   Cardiovascular: Negative for chest pain, palpitations, hypertension, irregular heartbeat and swelling in legs/feet.  Gastrointestinal: Negative for blood in stool, constipation and diarrhea.  Endocrine: Negative for heat intolerance and increased urination.  Genitourinary: Negative for difficulty urinating and vaginal dryness.  Musculoskeletal: Positive for  arthralgias, gait problem, joint pain, joint swelling, morning stiffness and muscle tenderness. Negative for myalgias, muscle weakness and myalgias.  Skin: Negative for color change, rash, hair loss, skin tightness, ulcers and sensitivity to sunlight.  Allergic/Immunologic: Negative for susceptible to infections.  Neurological: Negative for dizziness, numbness, memory loss, night sweats and weakness.  Hematological: Negative for swollen glands.  Psychiatric/Behavioral: Positive for sleep disturbance. Negative for depressed mood. The patient is not nervous/anxious.     PMFS History:  Patient Active Problem List   Diagnosis Date Noted  . Fracture, Colles, right, closed 04/09/2018  . Closed fracture of right distal radius and ulna, initial encounter 03/27/2018  . Lumbar radiculopathy 09/19/2016  . RENAL CALCULUS 05/04/2008  . FLANK PAIN, RIGHT 05/04/2008    Past Medical History:  Diagnosis Date  . Anxiety   . Arthritis   . Chronic back pain   . Closed fracture of right distal radius   . History of kidney stones   . Renal calculus, right   . Wears glasses     Family History  Problem Relation Age of Onset  . Fibromyalgia Mother   . Heart disease Mother   . Macular degeneration Mother   . Rheum arthritis Father   . Rheum arthritis Sister   . Cancer Brother   . Breast cancer Neg Hx    Past Surgical History:  Procedure Laterality Date  . COLONOSCOPY  2012  . CYSTO/  RIGHT URETERAL STENT PLACEMENT  05-10-2008  . CYSTOSCOPY W/ URETERAL STENT PLACEMENT Right 06/09/2014   Procedure: CYSTOSCOPY  WITH RETROGRADE PYELOGRAM/URETERAL STENT PLACEMENT;  Surgeon: Bernestine Amass, MD;  Location: [facility];  Service: Urology;  Laterality: Right;  . EXTRACORPOREAL SHOCK WAVE LITHOTRIPSY Right 05-10-2008  . KNEE ARTHROSCOPY Left 2009  . OPEN REDUCTION INTERNAL FIXATION (ORIF) DISTAL RADIAL FRACTURE Right 03/27/2018   Procedure: OPEN REDUCTION INTERNAL FIXATION (ORIF) RIGHT DISTAL  RADIUS FRACTURE;  Surgeon: Leandrew Koyanagi, MD;  Location: [place];  Service: Orthopedics;  Laterality: Right;  . WISDOM TOOTH EXTRACTION  AGE 49   Social History   Social History Narrative  . Not on file    There is no immunization history on file for this patient.   Objective: Vital Signs: BP 128/66 (BP Location: Left Arm, Patient Position: Sitting, Cuff Size: Normal)   Pulse 80   Resp 16   Ht 5' 6" (1.676 m)   Wt 185 lb 12.8 oz (84.3 kg)   BMI 29.99 kg/m    Physical Exam Vitals signs and nursing note reviewed.  Constitutional:      Appearance: She is well-developed.  HENT:     Head: Normocephalic and atraumatic.  Eyes:     Conjunctiva/sclera: Conjunctivae normal.  Neck:     Musculoskeletal: Normal range of motion.  Cardiovascular:     Rate and Rhythm: Normal rate and regular rhythm.     Heart sounds: Normal heart sounds.  Pulmonary:     Effort: Pulmonary effort is normal.     Breath sounds: Normal breath sounds.  Abdominal:     General: Bowel sounds are normal.     Palpations: Abdomen is soft.  Lymphadenopathy:     Cervical: No cervical adenopathy.  Skin:    General: Skin is warm and dry.     Capillary Refill: Capillary refill takes less than 2 seconds.  Neurological:     Mental Status: She is alert and oriented to person, place, and time.  Psychiatric:        Behavior: Behavior normal.      Musculoskeletal Exam: C-spine was in good range of motion.  Shoulder joints, elbow joints, wrist joints with good range of motion.  She has DIP and PIP thickening with no synovitis.  Hip joints were in good range of motion.  She has discomfort range of motion of bilateral knee joints.  She has warmth on palpation of bilateral knee joints.  No effusion was noted.  CDAI Exam: CDAI Score: 2.8  Patient Global: 4 mm; Provider Global: 4 mm Swollen: 0 ; Tender: 2  Joint Exam      Right  Left  Knee   Tender   Tender     Investigation: No additional  findings.  Imaging: Xr Hand 2 View Left  Result Date: 07/22/2019 PIP and DIP narrowing was noted.  No MCP, intercarpal radiocarpal joint space narrowing was noted.  No erosive changes were noted. Impression: These findings are consistent with osteoarthritis of the hand.  Xr Hand 2 View Right  Result Date: 07/22/2019 PIP and DIP narrowing was noted.  No MCP, intercarpal radiocarpal joint space narrowing was noted.  Hardware is present in the radius due to previous fracture. Impression: These findings are consistent with osteoarthritis of the hand.  Xr Knee 3 View Right  Result Date: 07/22/2019 Mild to moderate medial compartment narrowing was noted.  No chondrocalcinosis was noted.  Mild patellofemoral narrowing was noted. Impression: These findings are consistent with mild to moderate osteoarthritis and mild chondromalacia patella.   Recent Labs: Lab Results  Component Value  Date   HGB 15.0 06/09/2014   PROT 6.8 07/22/2019   QFTBGOLDPLUS NEGATIVE 07/22/2019   July 22, 2019 SPEP normal, hepatitis B-, hepatitis C negative, HIV negative, immunoglobulins normal, G6PD normal, TB Gold negative, ESR 11, CK 113, uric acid 4.5, anti-CCP negative, ANA negative, ACE normal, HLA-B27 negative  Speciality Comments: No specialty comments available.  Procedures:  No procedures performed Allergies: Sulfa antibiotics   Assessment / Plan:     Visit Diagnoses: Rheumatoid arthritis involving multiple sites with positive rheumatoid factor (HCC)-she has recurrent pain and swelling in her bilateral knee joints.  She also gives history of intermittent pain in her hands.  She has positive rheumatoid factor, negative CCP.  Her sed rate was normal.  Patient states she has been getting cortisone injections frequently.  We had detailed discussion regarding different treatment options.  After discussing indications side effects contraindications we decided to proceed with Plaquenil.  She was in agreement.  She  will be starting on Plaquenil 200 mg p.o. twice daily.  She will need a baseline eye examination and then eye examination on a yearly basis.  She will need labs in 1 month and then every 3 months.  She is allergic to sulfa.  I made her cautious about starting at low-dose and having Benadryl next to her.  Patient was counseled on the purpose, proper use, and adverse effects of hydroxychloroquine including nausea/diarrhea, skin rash, headaches, and sun sensitivity.  Discussed importance of annual eye exams while on hydroxychloroquine to monitor to ocular toxicity and discussed importance of frequent laboratory monitoring.  Provided patient with eye exam form for baseline ophthalmologic exam.  Provided patient with educational materials on hydroxychloroquine and answered all questions.  Patient consented to hydroxychloroquine.  Will upload consent in the media tab.     High risk medication use-she is starting on Plaquenil today.  Primary osteoarthritis of both knees - Bilateral moderate osteoarthritis with mild chondromalacia patella.  Primary osteoarthritis of both hands-joint protection measures testing was discussed.  Primary osteoarthritis of both feet-proper fitting shoes were discussed.  DDD (degenerative disc disease), lumbar-she has some chronic lower back pain.  Family history of rheumatoid arthritis - Father and 2 of her sisters.  RENAL CALCULUS  Closed Colles' fracture of right radius with routine healing, subsequent encounter  Orders: No orders of the defined types were placed in this encounter.  No orders of the defined types were placed in this encounter.   .  Follow-Up Instructions: Return in about 4 weeks (around 08/28/2019) for Osteoarthritis, Rheumatoid arthritis.   Bo Merino, MD  Note - This record has been created using Editor, commissioning.  Chart creation errors have been sought, but may not always  have been located. Such creation errors do not reflect on   the standard of medical care.

## 2019-07-26 NOTE — Progress Notes (Signed)
I will discuss results at the follow-up visit.

## 2019-07-27 LAB — SEDIMENTATION RATE: Sed Rate: 11 mm/h (ref 0–30)

## 2019-07-27 LAB — URIC ACID: Uric Acid, Serum: 4.5 mg/dL (ref 2.5–7.0)

## 2019-07-27 LAB — HLA-B27 ANTIGEN: HLA-B27 Antigen: NEGATIVE

## 2019-07-27 LAB — GLUCOSE 6 PHOSPHATE DEHYDROGENASE: G-6PDH: 13 U/g Hgb (ref 7.0–20.5)

## 2019-07-27 LAB — ANA: Anti Nuclear Antibody (ANA): NEGATIVE

## 2019-07-27 LAB — CYCLIC CITRUL PEPTIDE ANTIBODY, IGG: Cyclic Citrullin Peptide Ab: <16 UNITS

## 2019-07-27 LAB — CK: Total CK: 113 U/L (ref 29–143)

## 2019-07-27 LAB — 14-3-3 ETA PROTEIN: 14-3-3 eta Protein: <0.2 ng/mL (ref <0.2)

## 2019-07-27 LAB — ANGIOTENSIN CONVERTING ENZYME: Angiotensin-Converting Enzyme: 48 U/L (ref 9–67)

## 2019-07-31 ENCOUNTER — Other Ambulatory Visit: Payer: Self-pay

## 2019-07-31 ENCOUNTER — Ambulatory Visit: Payer: Managed Care, Other (non HMO) | Admitting: Rheumatology

## 2019-07-31 ENCOUNTER — Encounter: Payer: Self-pay | Admitting: Rheumatology

## 2019-07-31 VITALS — BP 128/66 | HR 80 | Resp 16 | Ht 66.0 in | Wt 185.8 lb

## 2019-07-31 DIAGNOSIS — M19042 Primary osteoarthritis, left hand: Secondary | ICD-10-CM

## 2019-07-31 DIAGNOSIS — M170 Bilateral primary osteoarthritis of knee: Secondary | ICD-10-CM | POA: Diagnosis not present

## 2019-07-31 DIAGNOSIS — M0579 Rheumatoid arthritis with rheumatoid factor of multiple sites without organ or systems involvement: Secondary | ICD-10-CM

## 2019-07-31 DIAGNOSIS — Z8261 Family history of arthritis: Secondary | ICD-10-CM

## 2019-07-31 DIAGNOSIS — N200 Calculus of kidney: Secondary | ICD-10-CM

## 2019-07-31 DIAGNOSIS — M5136 Other intervertebral disc degeneration, lumbar region: Secondary | ICD-10-CM

## 2019-07-31 DIAGNOSIS — M19041 Primary osteoarthritis, right hand: Secondary | ICD-10-CM | POA: Diagnosis not present

## 2019-07-31 DIAGNOSIS — M19071 Primary osteoarthritis, right ankle and foot: Secondary | ICD-10-CM

## 2019-07-31 DIAGNOSIS — S52531D Colles' fracture of right radius, subsequent encounter for closed fracture with routine healing: Secondary | ICD-10-CM

## 2019-07-31 DIAGNOSIS — Z79899 Other long term (current) drug therapy: Secondary | ICD-10-CM | POA: Diagnosis not present

## 2019-07-31 DIAGNOSIS — M19072 Primary osteoarthritis, left ankle and foot: Secondary | ICD-10-CM

## 2019-07-31 MED ORDER — HYDROXYCHLOROQUINE SULFATE 200 MG PO TABS: 200.0000 mg | ORAL_TABLET | Freq: Two times a day (BID) | ORAL | 0 refills | Status: DC

## 2019-07-31 NOTE — Addendum Note (Signed)
Addended by: Francis Gaines C on: 07/31/2019 11:12 AM   Modules accepted: Orders

## 2019-07-31 NOTE — Patient Instructions (Addendum)
Standing Labs We placed an order today for your standing lab work.    Please come back and get your standing labs in 1 month, 3 months and then every 5 months.   We have open lab daily Monday through Thursday from 8:30-12:30 PM and 1:30-4:30 PM and Friday from 8:30-12:30 PM and 1:30-4:00 PM at the office of Dr. Pollyann Savoy.   You may experience shorter wait times on Monday and Friday afternoons. The office is located at [address], [address], [place], [place] [address] No appointment is necessary.   Labs are drawn by [facility].  You may receive a bill from [facility] for your lab work.  If you wish to have your labs drawn at another location, please call the office 24 hours in advance to send orders.  If you have any questions regarding directions or hours of operation,  please call [phone].   Just as a reminder please drink plenty of water prior to coming for your lab work. Thanks!    Hydroxychloroquine tablets What is this medicine? HYDROXYCHLOROQUINE (hye drox ee KLOR oh kwin) is used to treat rheumatoid arthritis and systemic lupus erythematosus. It is also used to treat malaria. This medicine may be used for other purposes; ask your health care provider or pharmacist if you have questions. COMMON BRAND NAME(S): Plaquenil, Quineprox What should I tell my health care provider before I take this medicine? They need to know if you have any of these conditions:  diabetes  eye disease, vision problems  G6PD deficiency  heart disease  history of irregular heartbeat  if you often drink alcohol  kidney disease  liver disease  porphyria  psoriasis  an unusual or allergic reaction to chloroquine, hydroxychloroquine, other medicines, foods, dyes, or preservatives  pregnant or trying to get pregnant  breast-feeding How should I use this medicine? Take this medicine by mouth with a glass of water. Follow the directions on the prescription label. Do not cut, crush  or chew this medicine. Swallow the tablets whole. Take this medicine with food. Avoid taking antacids within 4 hours of taking this medicine. It is best to separate these medicines by at least 4 hours. Take your medicine at regular intervals. Do not take it more often than directed. Take all of your medicine as directed even if you think you are better. Do not skip doses or stop your medicine early. Talk to your pediatrician regarding the use of this medicine in children. While this drug may be prescribed for selected conditions, precautions do apply. Overdosage: If you think you have taken too much of this medicine contact a poison control center or emergency room at once. NOTE: This medicine is only for you. Do not share this medicine with others. What if I miss a dose? If you miss a dose, take it as soon as you can. If it is almost time for your next dose, take only that dose. Do not take double or extra doses. What may interact with this medicine? Do not take this medicine with any of the following medications:  cisapride  dronedarone  pimozide  thioridazine This medicine may also interact with the following medications:  ampicillin  antacids  cimetidine  cyclosporine  digoxin  kaolin  medicines for diabetes, like insulin, glipizide, glyburide  medicines for seizures like carbamazepine, phenobarbital, phenytoin  mefloquine  methotrexate  other medicines that prolong the QT interval (cause an abnormal heart rhythm)  praziquantel This list may not describe all possible interactions. Give your health  care provider a list of all the medicines, herbs, non-prescription drugs, or dietary supplements you use. Also tell them if you smoke, drink alcohol, or use illegal drugs. Some items may interact with your medicine. What should I watch for while using this medicine? Visit your health care professional for regular checks on your progress. Tell your health care professional if  your symptoms do not start to get better or if they get worse. You may need blood work done while you are taking this medicine. If you take other medicines that can affect heart rhythm, you may need more testing. Talk to your health care professional if you have questions. Your vision may be tested before and during use of this medicine. Tell your health care professional right away if you have any change in your eyesight. What side effects may I notice from receiving this medicine? Side effects that you should report to your doctor or health care professional as soon as possible:  allergic reactions like skin rash, itching or hives, swelling of the face, lips, or tongue  changes in vision  decreased hearing or ringing of the ears  muscle weakness  redness, blistering, peeling or loosening of the skin, including inside the mouth  sensitivity to light  signs and symptoms of a dangerous change in heartbeat or heart rhythm like chest pain; dizziness; fast or irregular heartbeat; palpitations; feeling faint or lightheaded, falls; breathing problems  signs and symptoms of liver injury like dark yellow or brown urine; general ill feeling or flu-like symptoms; light-colored stools; loss of appetite; nausea; right upper belly pain; unusually weak or tired; yellowing of the eyes or skin  signs and symptoms of low blood sugar such as feeling anxious; confusion; dizziness; increased hunger; unusually weak or tired; sweating; shakiness; cold; irritable; headache; blurred vision; fast heartbeat; loss of consciousness  suicidal thoughts  uncontrollable head, mouth, neck, arm, or leg movements Side effects that usually do not require medical attention (report to your doctor or health care professional if they continue or are bothersome):  diarrhea  dizziness  hair loss  headache  irritable  loss of appetite  nausea, vomiting  stomach pain This list may not describe all possible side  effects. Call your doctor for medical advice about side effects. You may report side effects to FDA at 1-800-FDA-1088. Where should I keep my medicine? Keep out of the reach of children. Store at room temperature between 15 and 30 degrees C (59 and 86 degrees F). Protect from moisture and light. Throw away any unused medicine after the expiration date. NOTE: This sheet is a summary. It may not cover all possible information. If you have questions about this medicine, talk to your doctor, pharmacist, or health care provider.  2020 Elsevier/Gold Standard (2019-01-12 12:56:32)  Knee Exercises Ask your health care provider which exercises are safe for you. Do exercises exactly as told by your health care provider and adjust them as directed. It is normal to feel mild stretching, pulling, tightness, or discomfort as you do these exercises. Stop right away if you feel sudden pain or your pain gets worse. Do not begin these exercises until told by your health care provider. Stretching and range-of-motion exercises These exercises warm up your muscles and joints and improve the movement and flexibility of your knee. These exercises also help to relieve pain and swelling. Knee extension, prone 1. Lie on your abdomen (prone position) on a bed. 2. Place your left / right knee just beyond the edge of  the surface so your knee is not on the bed. You can put a towel under your left / right thigh just above your kneecap for comfort. 3. Relax your leg muscles and allow gravity to straighten your knee (extension). You should feel a stretch behind your left / right knee. 4. Hold this position for __________ seconds. 5. Scoot up so your knee is supported between repetitions. Repeat __________ times. Complete this exercise __________ times a day. Knee flexion, active  1. Lie on your back with both legs straight. If this causes back discomfort, bend your left / right knee so your foot is flat on the floor. 2. Slowly  slide your left / right heel back toward your buttocks. Stop when you feel a gentle stretch in the front of your knee or thigh (flexion). 3. Hold this position for __________ seconds. 4. Slowly slide your left / right heel back to the starting position. Repeat __________ times. Complete this exercise __________ times a day. Quadriceps stretch, prone  1. Lie on your abdomen on a firm surface, such as a bed or padded floor. 2. Bend your left / right knee and hold your ankle. If you cannot reach your ankle or pant leg, loop a belt around your foot and grab the belt instead. 3. Gently pull your heel toward your buttocks. Your knee should not slide out to the side. You should feel a stretch in the front of your thigh and knee (quadriceps). 4. Hold this position for __________ seconds. Repeat __________ times. Complete this exercise __________ times a day. Hamstring, supine 1. Lie on your back (supine position). 2. Loop a belt or towel over the ball of your left / right foot. The ball of your foot is on the walking surface, right under your toes. 3. Straighten your left / right knee and slowly pull on the belt to raise your leg until you feel a gentle stretch behind your knee (hamstring). ? Do not let your knee bend while you do this. ? Keep your other leg flat on the floor. 4. Hold this position for __________ seconds. Repeat __________ times. Complete this exercise __________ times a day. Strengthening exercises These exercises build strength and endurance in your knee. Endurance is the ability to use your muscles for a long time, even after they get tired. Quadriceps, isometric This exercise stretches the muscles in front of your thigh (quadriceps) without moving your knee joint (isometric). 1. Lie on your back with your left / right leg extended and your other knee bent. Put a rolled towel or small pillow under your knee if told by your health care provider. 2. Slowly tense the muscles in the  front of your left / right thigh. You should see your kneecap slide up toward your hip or see increased dimpling just above the knee. This motion will push the back of the knee toward the floor. 3. For __________ seconds, hold the muscle as tight as you can without increasing your pain. 4. Relax the muscles slowly and completely. Repeat __________ times. Complete this exercise __________ times a day. Straight leg raises This exercise stretches the muscles in front of your thigh (quadriceps) and the muscles that move your hips (hip flexors). 1. Lie on your back with your left / right leg extended and your other knee bent. 2. Tense the muscles in the front of your left / right thigh. You should see your kneecap slide up or see increased dimpling just above the knee. Your thigh may  even shake a bit. 3. Keep these muscles tight as you raise your leg 4-6 inches (10-15 cm) off the floor. Do not let your knee bend. 4. Hold this position for __________ seconds. 5. Keep these muscles tense as you lower your leg. 6. Relax your muscles slowly and completely after each repetition. Repeat __________ times. Complete this exercise __________ times a day. Hamstring, isometric 1. Lie on your back on a firm surface. 2. Bend your left / right knee about __________ degrees. 3. Dig your left / right heel into the surface as if you are trying to pull it toward your buttocks. Tighten the muscles in the back of your thighs (hamstring) to "dig" as hard as you can without increasing any pain. 4. Hold this position for __________ seconds. 5. Release the tension gradually and allow your muscles to relax completely for __________ seconds after each repetition. Repeat __________ times. Complete this exercise __________ times a day. Hamstring curls If told by your health care provider, do this exercise while wearing ankle weights. Begin with __________ lb weights. Then increase the weight by 1 lb (0.5 kg) increments. Do not  wear ankle weights that are more than __________ lb. 1. Lie on your abdomen with your legs straight. 2. Bend your left / right knee as far as you can without feeling pain. Keep your hips flat against the floor. 3. Hold this position for __________ seconds. 4. Slowly lower your leg to the starting position. Repeat __________ times. Complete this exercise __________ times a day. Squats This exercise strengthens the muscles in front of your thigh and knee (quadriceps). 1. Stand in front of a table, with your feet and knees pointing straight ahead. You may rest your hands on the table for balance but not for support. 2. Slowly bend your knees and lower your hips like you are going to sit in a chair. ? Keep your weight over your heels, not over your toes. ? Keep your lower legs upright so they are parallel with the table legs. ? Do not let your hips go lower than your knees. ? Do not bend lower than told by your health care provider. ? If your knee pain increases, do not bend as low. 3. Hold the squat position for __________ seconds. 4. Slowly push with your legs to return to standing. Do not use your hands to pull yourself to standing. Repeat __________ times. Complete this exercise __________ times a day. Wall slides This exercise strengthens the muscles in front of your thigh and knee (quadriceps). 1. Lean your back against a smooth wall or door, and walk your feet out 18-24 inches (46-61 cm) from it. 2. Place your feet hip-width apart. 3. Slowly slide down the wall or door until your knees bend __________ degrees. Keep your knees over your heels, not over your toes. Keep your knees in line with your hips. 4. Hold this position for __________ seconds. Repeat __________ times. Complete this exercise __________ times a day. Straight leg raises This exercise strengthens the muscles that rotate the leg at the hip and move it away from your body (hip abductors). 1. Lie on your side with your left  / right leg in the top position. Lie so your head, shoulder, knee, and hip line up. You may bend your bottom knee to help you keep your balance. 2. Roll your hips slightly forward so your hips are stacked directly over each other and your left / right knee is facing forward. 3. Leading with  your heel, lift your top leg 4-6 inches (10-15 cm). You should feel the muscles in your outer hip lifting. ? Do not let your foot drift forward. ? Do not let your knee roll toward the ceiling. 4. Hold this position for __________ seconds. 5. Slowly return your leg to the starting position. 6. Let your muscles relax completely after each repetition. Repeat __________ times. Complete this exercise __________ times a day. Straight leg raises This exercise stretches the muscles that move your hips away from the front of the pelvis (hip extensors). 1. Lie on your abdomen on a firm surface. You can put a pillow under your hips if that is more comfortable. 2. Tense the muscles in your buttocks and lift your left / right leg about 4-6 inches (10-15 cm). Keep your knee straight as you lift your leg. 3. Hold this position for __________ seconds. 4. Slowly lower your leg to the starting position. 5. Let your leg relax completely after each repetition. Repeat __________ times. Complete this exercise __________ times a day. This information is not intended to replace advice given to you by your health care provider. Make sure you discuss any questions you have with your health care provider. Document Released: 07/18/2005 Document Revised: 06/24/2018 Document Reviewed: 06/24/2018 Elsevier Patient Education  2020 [employer].

## 2019-08-03 ENCOUNTER — Telehealth: Payer: Self-pay | Admitting: Rheumatology

## 2019-08-03 NOTE — Telephone Encounter (Signed)
Patient advised that a prior authorization has been submitted for her medication. Patient advised will advise once a response is received.

## 2019-08-03 NOTE — Telephone Encounter (Signed)
Patient left a stating "Dr. Estanislado Pandy ordered a medication that my insurance won't pay for."  Patient is requesting a return call.

## 2019-08-04 NOTE — Telephone Encounter (Signed)
Patient advised PLQ has been approved. Patient advised approval is good from 08/04/19 to 08/03/20. Patient verbalized understanding.

## 2019-08-19 ENCOUNTER — Ambulatory Visit: Payer: Managed Care, Other (non HMO) | Admitting: Rheumatology

## 2019-08-27 ENCOUNTER — Ambulatory Visit: Payer: Managed Care, Other (non HMO) | Admitting: Rheumatology

## 2019-08-31 ENCOUNTER — Other Ambulatory Visit: Payer: Self-pay

## 2019-08-31 ENCOUNTER — Telehealth (INDEPENDENT_AMBULATORY_CARE_PROVIDER_SITE_OTHER): Payer: Managed Care, Other (non HMO) | Admitting: Rheumatology

## 2019-08-31 ENCOUNTER — Encounter: Payer: Self-pay | Admitting: Rheumatology

## 2019-08-31 DIAGNOSIS — M19072 Primary osteoarthritis, left ankle and foot: Secondary | ICD-10-CM

## 2019-08-31 DIAGNOSIS — Z79899 Other long term (current) drug therapy: Secondary | ICD-10-CM

## 2019-08-31 DIAGNOSIS — N200 Calculus of kidney: Secondary | ICD-10-CM

## 2019-08-31 DIAGNOSIS — M19042 Primary osteoarthritis, left hand: Secondary | ICD-10-CM

## 2019-08-31 DIAGNOSIS — M0579 Rheumatoid arthritis with rheumatoid factor of multiple sites without organ or systems involvement: Secondary | ICD-10-CM

## 2019-08-31 DIAGNOSIS — M170 Bilateral primary osteoarthritis of knee: Secondary | ICD-10-CM | POA: Diagnosis not present

## 2019-08-31 DIAGNOSIS — Z8261 Family history of arthritis: Secondary | ICD-10-CM

## 2019-08-31 DIAGNOSIS — M19041 Primary osteoarthritis, right hand: Secondary | ICD-10-CM

## 2019-08-31 DIAGNOSIS — M19071 Primary osteoarthritis, right ankle and foot: Secondary | ICD-10-CM

## 2019-08-31 DIAGNOSIS — M5136 Other intervertebral disc degeneration, lumbar region: Secondary | ICD-10-CM

## 2019-08-31 DIAGNOSIS — S52531D Colles' fracture of right radius, subsequent encounter for closed fracture with routine healing: Secondary | ICD-10-CM

## 2019-08-31 DIAGNOSIS — G8929 Other chronic pain: Secondary | ICD-10-CM

## 2019-08-31 NOTE — Progress Notes (Signed)
Virtual Visit via Telephone Note  I connected with Courtney Kramer on 08/31/19 at  1:15 PM EST by telephone and verified that I am speaking with the correct person using two identifiers.  Location: Patient: Courtney Kramer  Provider: Clinic  This service was conducted via virtual visit.  Both audio and visual tools were used.  The patient was located at Courtney Kramer. I was located in my office.  Consent was obtained prior to the virtual visit and is aware of possible charges through their insurance for this visit.  The patient is an established patient.  Dr. Corliss Skains, MD conducted the virtual visit and Sherron Ales, PA-C acted as scribe during the service.  Office staff helped with scheduling follow up visits after the service was conducted.   I discussed the limitations, risks, security and privacy concerns of performing an evaluation and management service by telephone and the availability of in person appointments. I also discussed with the patient that there may be a patient responsible charge related to this service. The patient expressed understanding and agreed to proceed.   CC: Pain in both knee joints  History of Present Illness: Patient is a 58 year old female with a past medical history of seropositive rheumatoid arthritis and osteoarthritis.  She is taking plaquenil 200 mg 1 tablet by mouth twice daily. She has been tolerating PLQ without any side effects. She has noticed less pain and inflammation in both knee joints since starting on PLQ 1 month ago.  She states the right knee joint feels a lot better since having the cortisone injection on 07/22/19. She is having less difficulty climbing steps and getting up from a chair.  She denies any other joint pain or joint swelling currently. She states she has noticed 70% improvement since starting on PLQ.  She currently rates her RA a 4/10.   Review of Systems  Constitutional: Positive for malaise/fatigue. Negative for fever.  Eyes: Negative for photophobia,  pain, discharge and redness.  Respiratory: Negative for cough, shortness of breath and wheezing.   Cardiovascular: Negative for chest pain and palpitations.  Gastrointestinal: Negative for blood in stool, constipation and diarrhea.  Genitourinary: Negative for dysuria.  Musculoskeletal: Positive for joint pain. Negative for back pain, myalgias and neck pain.  Skin: Negative for rash.  Neurological: Negative for dizziness and headaches.  Psychiatric/Behavioral: Negative for depression. The patient is not nervous/anxious and does not have insomnia.       Observations/Objective: Physical Exam  Constitutional: She is oriented to person, place, and time.  Neurological: She is alert and oriented to person, place, and time.  Psychiatric: Mood, memory, affect and judgment normal.    Patient reports morning stiffness for 10 minutes.   Patient denies nocturnal pain.  Difficulty dressing/grooming: Denies Difficulty climbing stairs: Reports Difficulty getting out of chair: Reports Difficulty using hands for taps, buttons, cutlery, and/or writing: Denies   Assessment and Plan: Visit Diagnoses: Rheumatoid arthritis involving multiple sites with positive rheumatoid factor (HCC):  Positive rheumatoid factor, negative CCP: She has noticed significant clinical improvement since starting on PLQ.  Her knee joint pain has improved by 70% according to the patient.  She had a right knee joint cortisone injection on 07/22/19, which resolved most of her discomfort. She is not having any other joint pain or joint swelling at this time. She is clinically doing well on Plaquenil 200 mg 1 tablet by mouth twice daily. She is tolerating PLQ without any side effects.  She will continue on this current treatment  regimen.  She does not need any refills at this time.  She was advised to notify us if she develops increased joint pain or joint swelling.  She will follow up in 3 months.   High risk medication use-Plaquenil  200 mg 1 tablet BID (started mid-November 2020). She had baseline PLQ eye exam. CBC and CMP were drawn on 06/05/19.  She is due to update lab work.  Standing orders will be placed today.   Primary osteoarthritis of both knees - Bilateral moderate osteoarthritis with mild chondromalacia patella.  She has chronic pain in both knee joints, which has improved significantly since the right knee joint cortisone injection on 07/22/19 and starting on PLQ 1 month ago.  No joint swelling currently.   Primary osteoarthritis of both hands-She is not having any pain or inflammation in her hands at this time.  Joint protection and muscle strengthening were discussed.   Primary osteoarthritis of both feet-He has no discomfort or joint swelling in her feet.   DDD (degenerative disc disease), lumbar: She has intermittent lower back pain, which is exacerbated by lifting heavy objects.  She uses a back brace on occasion.   Other medical conditions are listed as follows:   Family history of rheumatoid arthritis - Father and 2 of her sisters.  RENAL CALCULUS  Closed Colles' fracture of right radius with routine healing, subsequent encounter  Follow Up Instructions: She will follow up in 3 months.    I discussed the assessment and treatment plan with the patient. The patient was provided an opportunity to ask questions and all were answered. The patient agreed with the plan and demonstrated an understanding of the instructions.   The patient was advised to call back or seek an in-person evaluation if the symptoms worsen or if the condition fails to improve as anticipated.  I provided 15 minutes of non-face-to-face time during this encounter.   Bo Merino, MD   Scribed by-  Hazel Sams, PA-C

## 2019-09-02 ENCOUNTER — Telehealth: Payer: Managed Care, Other (non HMO) | Admitting: Rheumatology

## 2019-09-09 ENCOUNTER — Telehealth: Payer: Self-pay | Admitting: Rheumatology

## 2019-09-09 NOTE — Telephone Encounter (Signed)
Tried to call patient to schedule 3 month follow-up appointment.  Voicemail was full / unable to leave a message.

## 2019-09-09 NOTE — Telephone Encounter (Signed)
-----   Message from Shona Needles, RT sent at 08/31/2019  3:53 PM EST ----- Regarding: 3 MONTH F/U

## 2019-09-10 ENCOUNTER — Other Ambulatory Visit: Payer: Self-pay | Admitting: Rheumatology

## 2019-09-10 DIAGNOSIS — M0579 Rheumatoid arthritis with rheumatoid factor of multiple sites without organ or systems involvement: Secondary | ICD-10-CM

## 2019-09-14 ENCOUNTER — Other Ambulatory Visit: Payer: Self-pay

## 2019-09-14 DIAGNOSIS — Z79899 Other long term (current) drug therapy: Secondary | ICD-10-CM

## 2019-09-14 NOTE — Telephone Encounter (Signed)
Last Visit: 08/31/2019  Next Visit: 11/26/2019  Labs: 06/05/2019   Eye exam: no baseline eye exam on file.   Advised patient she is due to update labs, [facility] She will call prior to going so orders can be released. Patient states she had eye exam at [facility] in [place] on [address].  I attempted to contact the opthalmologist and his office is closed this week.

## 2019-09-14 NOTE — Telephone Encounter (Addendum)
[facility] orders released for [facility], patient has an appointment on Wednesday 09/16/2019. Patient will drop off copy of eye exam on Wednesday.   PLQ eye exam: 08/18/2019 WNL  Labs: 09/16/2019 WNL   Okay to refill per Dr. Estanislado Pandy.

## 2019-09-17 LAB — CBC WITH DIFFERENTIAL/PLATELET
Absolute Monocytes: 517 cells/uL (ref 200–950)
Basophils Absolute: 82 cells/uL (ref 0–200)
Basophils Relative: 1 %
Eosinophils Absolute: 123 cells/uL (ref 15–500)
Eosinophils Relative: 1.5 %
HCT: 42.3 % (ref 35.0–45.0)
Hemoglobin: 14.5 g/dL (ref 11.7–15.5)
Lymphs Abs: 2042 cells/uL (ref 850–3900)
MCH: 30.5 pg (ref 27.0–33.0)
MCHC: 34.3 g/dL (ref 32.0–36.0)
MCV: 88.9 fL (ref 80.0–100.0)
MPV: 11.4 fL (ref 7.5–12.5)
Monocytes Relative: 6.3 %
Neutro Abs: 5437 cells/uL (ref 1500–7800)
Neutrophils Relative %: 66.3 %
Platelets: 278 10*3/uL (ref 140–400)
RBC: 4.76 10*6/uL (ref 3.80–5.10)
RDW: 11.3 % (ref 11.0–15.0)
Total Lymphocyte: 24.9 %
WBC: 8.2 10*3/uL (ref 3.8–10.8)

## 2019-09-17 LAB — COMPLETE METABOLIC PANEL WITH GFR
AG Ratio: 1.6 (calc) (ref 1.0–2.5)
ALT: 19 U/L (ref 6–29)
AST: 21 U/L (ref 10–35)
Albumin: 4.2 g/dL (ref 3.6–5.1)
Alkaline phosphatase (APISO): 89 U/L (ref 37–153)
BUN: 14 mg/dL (ref 7–25)
CO2: 29 mmol/L (ref 20–32)
Calcium: 9.3 mg/dL (ref 8.6–10.4)
Chloride: 104 mmol/L (ref 98–110)
Creat: 0.77 mg/dL (ref 0.50–1.05)
GFR, Est African American: 99 mL/min/{1.73_m2} (ref >=60)
GFR, Est Non African American: 85 mL/min/{1.73_m2} (ref >=60)
Globulin: 2.6 g/dL (calc) (ref 1.9–3.7)
Glucose, Bld: 90 mg/dL (ref 65–139)
Potassium: 3.8 mmol/L (ref 3.5–5.3)
Sodium: 141 mmol/L (ref 135–146)
Total Bilirubin: 0.4 mg/dL (ref 0.2–1.2)
Total Protein: 6.8 g/dL (ref 6.1–8.1)

## 2019-09-17 NOTE — Progress Notes (Signed)
CBC and CMP are within normal limits.

## 2019-09-30 ENCOUNTER — Other Ambulatory Visit: Payer: Self-pay | Admitting: Rheumatology

## 2019-09-30 DIAGNOSIS — M0579 Rheumatoid arthritis with rheumatoid factor of multiple sites without organ or systems involvement: Secondary | ICD-10-CM

## 2019-11-19 NOTE — Progress Notes (Signed)
Office Visit Note  Patient: Courtney Kramer             Date of Birth: 13-Aug-1961           MRN: [id number]             PCP: Dema Severin, NP Referring: Dema Severin, NP Visit Date: 11/26/2019 Occupation: @GUAROCC @  Subjective:  Left knee joint pain   History of Present Illness: Courtney Kramer is a 59 y.o. female with history of seropositive rheumatoid arthritis, osteoarthritis, and DDD.  Patient is taking Plaquenil 200 mg 1 tablet twice daily.  She started on Plaquenil November 2020 and has no significant improvement.  She states that for the past 1 week she has been experiencing left knee joint pain and inflammation.  She denies any changes in activity or injuries.  She had a left knee joint cortisone injection on 04/08/2019.  She continues to have discomfort in bilateral third PIP joints.  She has intermittent lower back pain but denies any symptoms of radiculopathy.    Activities of Daily Living:  Patient reports morning stiffness for 30 minutes.   Patient Denies nocturnal pain.  Difficulty dressing/grooming: Denies Difficulty climbing stairs: Reports Difficulty getting out of chair: Reports Difficulty using hands for taps, buttons, cutlery, and/or writing: Denies  Review of Systems  Constitutional: Positive for fatigue.  HENT: Negative for mouth sores, mouth dryness and nose dryness.   Eyes: Negative for pain, itching, visual disturbance and dryness.  Respiratory: Negative for cough, hemoptysis, shortness of breath and difficulty breathing.   Cardiovascular: Negative for chest pain, palpitations, hypertension and swelling in legs/feet.  Gastrointestinal: Negative for blood in stool, constipation and diarrhea.  Endocrine: Negative for increased urination.  Genitourinary: Negative for difficulty urinating and painful urination.  Musculoskeletal: Positive for arthralgias, joint pain, joint swelling and morning stiffness. Negative for myalgias, muscle weakness, muscle  tenderness and myalgias.  Skin: Negative for color change, pallor, rash, hair loss, nodules/bumps, skin tightness, ulcers and sensitivity to sunlight.  Allergic/Immunologic: Negative for susceptible to infections.  Neurological: Negative for dizziness, headaches, memory loss and weakness.  Hematological: Negative for bruising/bleeding tendency and swollen glands.  Psychiatric/Behavioral: Negative for depressed mood, confusion and sleep disturbance. The patient is not nervous/anxious.     PMFS History:  Patient Active Problem List   Diagnosis Date Noted  . Fracture, Colles, right, closed 04/09/2018  . Closed fracture of right distal radius and ulna, initial encounter 03/27/2018  . Lumbar radiculopathy 09/19/2016  . RENAL CALCULUS 05/04/2008  . FLANK PAIN, RIGHT 05/04/2008    Past Medical History:  Diagnosis Date  . Anxiety   . Arthritis   . Chronic back pain   . Closed fracture of right distal radius   . History of kidney stones   . Renal calculus, right   . Wears glasses     Family History  Problem Relation Age of Onset  . Fibromyalgia Mother   . Heart disease Mother   . Macular degeneration Mother   . Rheum arthritis Father   . Rheum arthritis Sister   . Cancer Brother   . Breast cancer Neg Hx    Past Surgical History:  Procedure Laterality Date  . COLONOSCOPY  2012  . CYSTO/  RIGHT URETERAL STENT PLACEMENT  05-10-2008  . CYSTOSCOPY W/ URETERAL STENT PLACEMENT Right 06/09/2014   Procedure: CYSTOSCOPY WITH RETROGRADE PYELOGRAM/URETERAL STENT PLACEMENT;  Surgeon: 06/11/2014, MD;  Location: [facility];  Service: Urology;  Laterality:  Right;  Marland Kitchen EXTRACORPOREAL SHOCK WAVE LITHOTRIPSY Right 05-10-2008  . KNEE ARTHROSCOPY Left 2009  . OPEN REDUCTION INTERNAL FIXATION (ORIF) DISTAL RADIAL FRACTURE Right 03/27/2018   Procedure: OPEN REDUCTION INTERNAL FIXATION (ORIF) RIGHT DISTAL RADIUS FRACTURE;  Surgeon: Leandrew Koyanagi, MD;  Location: [facility];   Service: Orthopedics;  Laterality: Right;  . WISDOM TOOTH EXTRACTION  AGE 37   Social History   Social History Narrative  . Not on file    There is no immunization history on file for this patient.   Objective: Vital Signs: BP 133/76 (BP Location: Left Arm, Patient Position: Sitting, Cuff Size: Normal)   Pulse 93   Resp 15   Ht 5\' 6"  (1.676 m)   Wt 194 lb (88 kg)   BMI 31.31 kg/m    Physical Exam Vitals and nursing note reviewed.  Constitutional:      Appearance: She is well-developed.  HENT:     Head: Normocephalic and atraumatic.  Eyes:     Conjunctiva/sclera: Conjunctivae normal.  Pulmonary:     Effort: Pulmonary effort is normal.  Abdominal:     General: Bowel sounds are normal.     Palpations: Abdomen is soft.  Musculoskeletal:     Cervical back: Normal range of motion.  Lymphadenopathy:     Cervical: No cervical adenopathy.  Skin:    General: Skin is warm and dry.     Capillary Refill: Capillary refill takes less than 2 seconds.  Neurological:     Mental Status: She is alert and oriented to person, place, and time.  Psychiatric:        Behavior: Behavior normal.      Musculoskeletal Exam: C-spine, thoracic spine, lumbar spine good range of motion.  No midline spinal tenderness.  No SI joint tenderness.  Shoulder joints, with joints, strength, MCPs, PIPs and DIPs good range of motion no synovitis.  She has PIP and DIP thickening consistent with osteoarthritis of both hands.  She has tenderness of bilateral third PIP joints.  She has complete fist formation bilaterally.  Hip joints have good range of motion with no discomfort.  Left knee has warmth and painful range of motion.  Right knee has good range of motion with no warmth or effusion.  Ankle joints have good range of motion no tenderness or synovitis.  CDAI Exam: CDAI Score: 8.6  Patient Global: 3 mm; Provider Global: 3 mm Swollen: 4 ; Tender: 4  Joint Exam 11/26/2019      Right  Left  PIP 2      Swollen Tender  PIP 3  Swollen Tender  Swollen Tender  Knee     Swollen Tender     Investigation: No additional findings.  Imaging: No results found.  Recent Labs: Lab Results  Component Value Date   WBC 8.2 09/16/2019   HGB 14.5 09/16/2019   PLT 278 09/16/2019   NA 141 09/16/2019   K 3.8 09/16/2019   CL 104 09/16/2019   CO2 29 09/16/2019   GLUCOSE 90 09/16/2019   BUN 14 09/16/2019   CREATININE 0.77 09/16/2019   BILITOT 0.4 09/16/2019   AST 21 09/16/2019   ALT 19 09/16/2019   PROT 6.8 09/16/2019   CALCIUM 9.3 09/16/2019   GFRAA 99 09/16/2019   QFTBGOLDPLUS NEGATIVE 07/22/2019    Speciality Comments: PLQ eye exam: 08/18/2019 WNL. Dr. Elta Guadeloupe Mitchum. Follow up in 1 year.  Procedures:  Large Joint Inj on 11/26/2019 12:01 PM Indications: pain Details: 27 G 1.5  in needle, medial approach  Arthrogram: No  Medications: 1.5 mL lidocaine 1 %; 40 mg triamcinolone acetonide 40 MG/ML Aspirate: 0 mL Outcome: tolerated well, no immediate complications Procedure, treatment alternatives, risks and benefits explained, specific risks discussed. Consent was given by the patient. Immediately prior to procedure a time out was called to verify the correct patient, procedure, equipment, support staff and site/side marked as required. Patient was prepped and draped in the usual sterile fashion.     Allergies: Sulfa antibiotics   Assessment / Plan:     Visit Diagnoses: Rheumatoid arthritis involving multiple sites with positive rheumatoid factor (HCC) - Positive rheumatoid factor, negative CCP: She presents today with left knee joint pain and warmth on exam.  She has been experiencing discomfort for the past 1 week.  She did not have any changes in activity or injuries recently.  She requested a left knee joint cortisone injection.  Tolerated the procedure well.  The procedure note was completed above.  She has persistent tenderness in bilateral third PIP joints.  She has been taking  Plaquenil 200 mg 1 tablet twice daily since November 2020.  She is been tolerating Plaquenil without any side effects  According to the patient she notices significant improvement since starting on Plaquenil.  We discussed that since she continues to have inflammation she will be a good candidate for commendation therapy.  Indications, contraindications, and potential side effects of Arava were discussed today.  All questions were addressed and consent was obtained.  She was advised to discontinue Celebrex when she starts on [place].  She will take arava 10 mg 1 tablet daily for 2 weeks and if labs are stable she will increase to 20 mg po daily.  She will return for lab work in 2 weeks x 2 than 2 months and every 3 months.  Standing orders are in place.  She was advised to notify us if she cannot tolerate Arava.  She will continue taking Plaquenil as prescribed.  She will follow-up in the office in 6 weeks.  Drug Counseling TB Gold: Negative on 07/22/19 Hepatitis panel: Negative on 07/22/19  Chest-xray:  Pending  Contraception: She is postmenopausal   Alcohol use: Discussed the importance of avoiding alcohol use.  Medication counseling: Patient was counseled on the purpose, proper use, and adverse effects of leflunomide including risk of infection, nausea/diarrhea/weight loss, increase in blood pressure, rash, hair loss, tingling in the hands and feet, and signs and symptoms of interstitial lung disease.   Also counseled on Black Box warning of liver injury and importance of avoiding alcohol while on therapy. Discussed that there is the possibility of an increased risk of malignancy but it is not well understood if this increased risk is due to the medication or the disease state.  Counseled patient to avoid live vaccines. Recommend annual influenza, Pneumovax 23, Prevnar 13, and Shingrix as indicated.   Discussed the importance of frequent monitoring of liver function and blood count.  Standing orders  placed.  Discussed importance of birth control while on leflunomide due to risk of congenital abnormalities, and patient confirms she is postmenopausal.  Provided patient with educational materials on leflunomide and answered all questions.  Patient consented to [place] use, and consent will be uploaded into the media tab.   Patient dose will be 10 mg 1 tablet daily for 2 weeks then increase to 20 mg po daily. Prescription pending lab results and/or insurance approval.  High risk medication use - She will be adding on  Arava 10 mg po daily for 2 weeks and if labs are stable at that time she will increase to 20 mg po daily. She will continue taking Plaquenil 200 mg 1 tablet BID (started mid-November 2020).eye exam: 08/18/2019.  CBC and CMP were within normal limits on 09/16/2019.  She is due to update lab work today.  Orders were released.  She has had the necessary baseline immunosuppressive lab work.- Plan: CBC with Differential/Platelet, COMPLETE METABOLIC PANEL WITH GFR  Primary osteoarthritis of both hands: She has PIP and DIP thickening consistent with osteoarthritis of both hands.  She has tenderness of bilateral third PIP joints.  Inflammation was noted in the left third PIP.  She has complete fist formation bilaterally.  Joint protection and muscle strengthening were discussed.  She takes Celebrex 200 mg 1 capsule by mouth daily for pain relief.  Primary osteoarthritis of both knees - Bilateral moderate osteoarthritis with mild chondromalacia patella: She presents today with increased pain in the left knee joint which started 1 week ago.  She has not had any changes in activity or injuries recently.  No mechanical symptoms.  She has good range of motion with discomfort on exam.  Warmth was noted but no effusion.  She had a left knee joint cortisone injection on 04/08/2019.  She requested a left knee joint cortisone injection today.  She tolerated the procedure well.  Procedure note completed above.   Her  right knee has good range of motion with no discomfort on exam today.  No warmth or effusion of the right knee was noted.  She had a right knee joint cortisone injection on 07/22/2019 which provided significant relief.  Primary osteoarthritis of both feet: She is not having any discomfort in her feet at this time.  She wears proper fitting shoes.  DDD (degenerative disc disease), lumbar: She experiences intermittent lower back pain.  She has no symptoms of radiculopathy.  Her discomfort usually last only 2 to 3 days and resolves on its own.  Other medical conditions are listed as follows:  Family history of rheumatoid arthritis - Father and 2 of her sisters.  RENAL CALCULUS  Closed Colles' fracture of right radius with routine healing, subsequent encounter  Orders: Orders Placed This Encounter  Procedures  . Large Joint Inj  . DG Chest 2 View  . CBC with Differential/Platelet  . COMPLETE METABOLIC PANEL WITH GFR   No orders of the defined types were placed in this encounter.   Face-to-face time spent with patient was 30 minutes. Greater than 50% of time was spent in counseling and coordination of care.  Follow-Up Instructions: Return in about 6 weeks (around 01/07/2020) for Rheumatoid arthritis, Osteoarthritis.   Gearldine Bienenstock, PA-C   I examined and evaluated the patient with Sherron Ales PA.  Shaunie's symptoms have remarkably improved on Plaquenil although she still have significant synovitis and inflammation in her joints.  A cortisone injection was given to her left knee joint after different treatment options were discussed.  We also had detailed discussion regarding adding DMARDs.  Indications side effects contraindications were discussed.  Patient is against methotrexate as 2 of her family members could not tolerate methotrexate.  We discussed the option of adding Arava.  She was in agreement.  We will add Arava 10 mg p.o. daily for 2 weeks if tolerated and her labs are stable then  will increase it to 20 mg p.o. daily.  We will check labs in 2 weeks and then every 2 months  to monitor for drug toxicity.  Once the labs are stable the labs could be monitored every 3 months.  The plan of care was discussed as noted above.  Pollyann Savoy, MD  Note - This record has been created using Animal nutritionist.  Chart creation errors have been sought, but may not always  have been located. Such creation errors do not reflect on  the standard of medical care.

## 2019-11-26 ENCOUNTER — Other Ambulatory Visit: Payer: Self-pay

## 2019-11-26 ENCOUNTER — Ambulatory Visit: Payer: Managed Care, Other (non HMO) | Admitting: Rheumatology

## 2019-11-26 ENCOUNTER — Encounter: Payer: Self-pay | Admitting: Rheumatology

## 2019-11-26 VITALS — BP 133/76 | HR 93 | Resp 15 | Ht 66.0 in | Wt 194.0 lb

## 2019-11-26 DIAGNOSIS — M19071 Primary osteoarthritis, right ankle and foot: Secondary | ICD-10-CM

## 2019-11-26 DIAGNOSIS — M25562 Pain in left knee: Secondary | ICD-10-CM

## 2019-11-26 DIAGNOSIS — M170 Bilateral primary osteoarthritis of knee: Secondary | ICD-10-CM | POA: Diagnosis not present

## 2019-11-26 DIAGNOSIS — S52531D Colles' fracture of right radius, subsequent encounter for closed fracture with routine healing: Secondary | ICD-10-CM

## 2019-11-26 DIAGNOSIS — M0579 Rheumatoid arthritis with rheumatoid factor of multiple sites without organ or systems involvement: Secondary | ICD-10-CM | POA: Diagnosis not present

## 2019-11-26 DIAGNOSIS — M19041 Primary osteoarthritis, right hand: Secondary | ICD-10-CM | POA: Diagnosis not present

## 2019-11-26 DIAGNOSIS — Z79899 Other long term (current) drug therapy: Secondary | ICD-10-CM

## 2019-11-26 DIAGNOSIS — G8929 Other chronic pain: Secondary | ICD-10-CM

## 2019-11-26 DIAGNOSIS — M19042 Primary osteoarthritis, left hand: Secondary | ICD-10-CM

## 2019-11-26 DIAGNOSIS — Z8261 Family history of arthritis: Secondary | ICD-10-CM

## 2019-11-26 DIAGNOSIS — M19072 Primary osteoarthritis, left ankle and foot: Secondary | ICD-10-CM

## 2019-11-26 DIAGNOSIS — N200 Calculus of kidney: Secondary | ICD-10-CM

## 2019-11-26 DIAGNOSIS — M5136 Other intervertebral disc degeneration, lumbar region: Secondary | ICD-10-CM

## 2019-11-26 LAB — CBC WITH DIFFERENTIAL/PLATELET
Absolute Monocytes: 508 cells/uL (ref 200–950)
Basophils Absolute: 68 cells/uL (ref 0–200)
Basophils Relative: 1.1 %
Eosinophils Absolute: 68 cells/uL (ref 15–500)
Eosinophils Relative: 1.1 %
HCT: 45.1 % — ABNORMAL HIGH (ref 35.0–45.0)
Hemoglobin: 15 g/dL (ref 11.7–15.5)
Lymphs Abs: 1680 cells/uL (ref 850–3900)
MCH: 30 pg (ref 27.0–33.0)
MCHC: 33.3 g/dL (ref 32.0–36.0)
MCV: 90.2 fL (ref 80.0–100.0)
MPV: 10.9 fL (ref 7.5–12.5)
Monocytes Relative: 8.2 %
Neutro Abs: 3875 cells/uL (ref 1500–7800)
Neutrophils Relative %: 62.5 %
Platelets: 305 10*3/uL (ref 140–400)
RBC: 5 10*6/uL (ref 3.80–5.10)
RDW: 11.6 % (ref 11.0–15.0)
Total Lymphocyte: 27.1 %
WBC: 6.2 10*3/uL (ref 3.8–10.8)

## 2019-11-26 LAB — COMPLETE METABOLIC PANEL WITH GFR
AG Ratio: 1.7 (calc) (ref 1.0–2.5)
ALT: 15 U/L (ref 6–29)
AST: 22 U/L (ref 10–35)
Albumin: 4.2 g/dL (ref 3.6–5.1)
Alkaline phosphatase (APISO): 100 U/L (ref 37–153)
BUN: 14 mg/dL (ref 7–25)
CO2: 27 mmol/L (ref 20–32)
Calcium: 9.7 mg/dL (ref 8.6–10.4)
Chloride: 106 mmol/L (ref 98–110)
Creat: 0.76 mg/dL (ref 0.50–1.05)
GFR, Est African American: 100 mL/min/{1.73_m2} (ref >=60)
GFR, Est Non African American: 86 mL/min/{1.73_m2} (ref >=60)
Globulin: 2.5 g/dL (calc) (ref 1.9–3.7)
Glucose, Bld: 67 mg/dL (ref 65–99)
Potassium: 4.4 mmol/L (ref 3.5–5.3)
Sodium: 141 mmol/L (ref 135–146)
Total Bilirubin: 0.5 mg/dL (ref 0.2–1.2)
Total Protein: 6.7 g/dL (ref 6.1–8.1)

## 2019-11-26 NOTE — Progress Notes (Signed)
Pharmacy Note  Subjective: Patient presents today to the [facility] for follow up office visit.  Patient seen by the pharmacist for counseling on leflunomide Ranae Plumber) for rheumatoid arthritis.  She is currently on Plaquenil with inadequate response.  Objective: CBC    Component Value Date/Time   WBC 8.2 09/16/2019 1604   RBC 4.76 09/16/2019 1604   HGB 14.5 09/16/2019 1604   HCT 42.3 09/16/2019 1604   PLT 278 09/16/2019 1604   MCV 88.9 09/16/2019 1604   MCH 30.5 09/16/2019 1604   MCHC 34.3 09/16/2019 1604   RDW 11.3 09/16/2019 1604   LYMPHSABS 2,042 09/16/2019 1604   EOSABS 123 09/16/2019 1604   BASOSABS 82 09/16/2019 1604    CMP     Component Value Date/Time   NA 141 09/16/2019 1604   K 3.8 09/16/2019 1604   CL 104 09/16/2019 1604   CO2 29 09/16/2019 1604   GLUCOSE 90 09/16/2019 1604   BUN 14 09/16/2019 1604   CREATININE 0.77 09/16/2019 1604   CALCIUM 9.3 09/16/2019 1604   PROT 6.8 09/16/2019 1604   AST 21 09/16/2019 1604   ALT 19 09/16/2019 1604   BILITOT 0.4 09/16/2019 1604   GFRNONAA 85 09/16/2019 1604   GFRAA 99 09/16/2019 1604    Baseline Immunosuppressant Therapy Labs Quantiferon TB Gold Latest Ref Rng & Units 07/22/2019  Quantiferon TB Gold Plus NEGATIVE NEGATIVE    Hepatitis Latest Ref Rng & Units 07/22/2019  Hep B Surface Ag NON-REACTI NON-REACTIVE  Hep B IgM NON-REACTI NON-REACTIVE  Hep C Ab NON-REACTI NON-REACTIVE  Hep C Ab NON-REACTI NON-REACTIVE    Lab Results  Component Value Date   HIV NON-REACTIVE 07/22/2019    Immunoglobulin Electrophoresis Latest Ref Rng & Units 07/22/2019  IgA  47 - 310 mg/dL 109  IgG 323 - 5,573 mg/dL 220  IgM 50 - 254 mg/dL 270(W)    Serum Protein Electrophoresis Latest Ref Rng & Units 09/16/2019  Total Protein 6.1 - 8.1 g/dL 6.8  Albumin 3.8 - 4.8 g/dL -  Alpha-1 0.2 - 0.3 g/dL -  Alpha-2 0.5 - 0.9 g/dL -  Beta Globulin 0.4 - 0.6 g/dL -  Beta 2 0.2 - 0.5 g/dL -  Gamma Globulin 0.8 - 1.7 g/dL -     Lab Results  Component Value Date   G6PDH 13.0 07/22/2019    No results found for: TPMT   Pregnancy status:  Post-menopausal  Chest X-ray: pending 11/26/2019  Assessment/Plan:  Patient was counseled on the purpose, proper use, and adverse effects of leflunomide including risk of infection, nausea/diarrhea/weight loss, increase in blood pressure, rash, hair loss, tingling in the hands and feet, and signs and symptoms of interstitial lung disease.   Also counseled on Black Box warning of liver injury and importance of avoiding alcohol while on therapy. Discussed that there is the possibility of an increased risk of malignancy but it is not well understood if this increased risk is due to the medication or the disease state.  Counseled patient to avoid live vaccines. Recommend annual influenza, Pneumovax 23, Prevnar 13, and Shingrix as indicated.   Discussed the importance of frequent monitoring of liver function and blood count.  Standing orders placed.  Discussed importance of birth control while on leflunomide due to risk of congenital abnormalities, and patient confirms post-menopausal.  Provided patient with educational materials on leflunomide and answered all questions.  Patient consented to [place] use, and consent will be uploaded into the media tab.    Patient dose  will be Arava 10 mg daily for 2 weeks then increase to 20 mg daily .  Prescription pending lab results and/or insurance approval.  All questions encouraged and answered.  Instructed patient to call with any further questions or concerns.  Mariella Saa, PharmD, [place], [place] Clinical Specialty Pharmacist [phone]  11/26/2019 1:07 PM

## 2019-11-27 NOTE — Progress Notes (Signed)
CBC and CMP WNL

## 2019-12-05 ENCOUNTER — Telehealth: Payer: Self-pay | Admitting: Rheumatology

## 2019-12-05 DIAGNOSIS — M0579 Rheumatoid arthritis with rheumatoid factor of multiple sites without organ or systems involvement: Secondary | ICD-10-CM

## 2019-12-07 NOTE — Telephone Encounter (Signed)
Called patient to schedule 6 week follow-up appointment.  Patient states she is not ready to try the new medication yet.  Patient states she wants to stay with her current medication through the Summer and will think about the new medication again in the Fall.

## 2019-12-07 NOTE — Telephone Encounter (Signed)
Please schedule patient for a follow up visit. Patient due April 2021. Thanks!  

## 2019-12-07 NOTE — Telephone Encounter (Signed)
Ok thank you for informing me.

## 2019-12-07 NOTE — Telephone Encounter (Signed)
Last Visit: 11/26/19 Next Visit: due April 2021. Message sent to the front to schedule patient.  Labs: 11/26/19 CBC and CMP WNL PLQ Eye exam: 08/18/2019 WNL  Current Dose per office note on 11/26/19 Plaquenil 200 mg 1 tablet twice daily   Okay to refill per Dr. Corliss Skains

## 2020-02-12 ENCOUNTER — Other Ambulatory Visit: Payer: Self-pay | Admitting: Urology

## 2020-02-16 ENCOUNTER — Other Ambulatory Visit: Payer: Self-pay | Admitting: Urology

## 2020-02-17 ENCOUNTER — Ambulatory Visit (HOSPITAL_COMMUNITY): Payer: Managed Care, Other (non HMO) | Admitting: Anesthesiology

## 2020-02-17 ENCOUNTER — Emergency Department (HOSPITAL_COMMUNITY)
Admission: EM | Admit: 2020-02-17 | Discharge: 2020-02-17 | Discharge status: Against Medical Advice | Payer: Managed Care, Other (non HMO) | Source: Home / Self Care

## 2020-02-17 ENCOUNTER — Ambulatory Visit (HOSPITAL_COMMUNITY): Payer: Managed Care, Other (non HMO)

## 2020-02-17 ENCOUNTER — Other Ambulatory Visit: Payer: Self-pay

## 2020-02-17 ENCOUNTER — Encounter (HOSPITAL_COMMUNITY): Payer: Self-pay | Admitting: Urology

## 2020-02-17 ENCOUNTER — Ambulatory Visit (HOSPITAL_COMMUNITY)
Admission: RE | Admit: 2020-02-17 | Discharge: 2020-02-17 | Disposition: A | Discharge status: Home / Self Care | Payer: Managed Care, Other (non HMO) | Attending: Urology | Admitting: Urology

## 2020-02-17 ENCOUNTER — Encounter (HOSPITAL_COMMUNITY)
Admission: RE | Disposition: A | Discharge status: Home / Self Care | Payer: Self-pay | Source: Home / Self Care | Attending: Urology

## 2020-02-17 ENCOUNTER — Encounter (HOSPITAL_COMMUNITY): Payer: Self-pay

## 2020-02-17 DIAGNOSIS — N201 Calculus of ureter: Secondary | ICD-10-CM | POA: Insufficient documentation

## 2020-02-17 DIAGNOSIS — Z20822 Contact with and (suspected) exposure to covid-19: Secondary | ICD-10-CM | POA: Diagnosis not present

## 2020-02-17 DIAGNOSIS — M1990 Unspecified osteoarthritis, unspecified site: Secondary | ICD-10-CM | POA: Insufficient documentation

## 2020-02-17 DIAGNOSIS — Z882 Allergy status to sulfonamides status: Secondary | ICD-10-CM | POA: Diagnosis not present

## 2020-02-17 DIAGNOSIS — Z87442 Personal history of urinary calculi: Secondary | ICD-10-CM | POA: Insufficient documentation

## 2020-02-17 HISTORY — PX: CYSTOSCOPY W/ URETERAL STENT PLACEMENT: SHX1429

## 2020-02-17 LAB — CBC
HCT: 42.9 % (ref 36.0–46.0)
Hemoglobin: 13.8 g/dL (ref 12.0–15.0)
MCH: 30.1 pg (ref 26.0–34.0)
MCHC: 32.2 g/dL (ref 30.0–36.0)
MCV: 93.5 fL (ref 80.0–100.0)
Platelets: 286 10*3/uL (ref 150–400)
RBC: 4.59 MIL/uL (ref 3.87–5.11)
RDW: 12.3 % (ref 11.5–15.5)
WBC: 6.2 10*3/uL (ref 4.0–10.5)
nRBC: 0 % (ref 0.0–0.2)

## 2020-02-17 LAB — BASIC METABOLIC PANEL
Anion gap: 8 (ref 5–15)
BUN: 17 mg/dL (ref 6–20)
CO2: 26 mmol/L (ref 22–32)
Calcium: 8.6 mg/dL — ABNORMAL LOW (ref 8.9–10.3)
Chloride: 102 mmol/L (ref 98–111)
Creatinine, Ser: 0.77 mg/dL (ref 0.44–1.00)
GFR calc Af Amer: >60 mL/min (ref >60)
GFR calc non Af Amer: >60 mL/min (ref >60)
Glucose, Bld: 89 mg/dL (ref 70–99)
Potassium: 3.8 mmol/L (ref 3.5–5.1)
Sodium: 136 mmol/L (ref 135–145)

## 2020-02-17 LAB — SARS CORONAVIRUS 2 BY RT PCR (HOSPITAL ORDER, PERFORMED IN ~~LOC~~ HOSPITAL LAB): SARS Coronavirus 2: NEGATIVE

## 2020-02-17 SURGERY — CYSTOSCOPY, WITH RETROGRADE PYELOGRAM AND URETERAL STENT INSERTION
Anesthesia: General | Laterality: Left

## 2020-02-17 MED ORDER — FENTANYL CITRATE (PF) 100 MCG/2ML IJ SOLN
INTRAMUSCULAR | Status: AC
  Filled 2020-02-17: qty 4

## 2020-02-17 MED ORDER — LACTATED RINGERS IV SOLN: INTRAVENOUS | Status: DC

## 2020-02-17 MED ORDER — DEXAMETHASONE SODIUM PHOSPHATE 10 MG/ML IJ SOLN
INTRAMUSCULAR | Status: DC | PRN
  Administered 2020-02-17: 5 mg via INTRAVENOUS

## 2020-02-17 MED ORDER — CEFPODOXIME PROXETIL 200 MG PO TABS: 200.0000 mg | ORAL_TABLET | Freq: Two times a day (BID) | ORAL | 0 refills | Status: DC

## 2020-02-17 MED ORDER — ACETAMINOPHEN 500 MG PO TABS
1000.0000 mg | ORAL_TABLET | Freq: Once | ORAL | Status: AC
  Administered 2020-02-17: 1000 mg via ORAL
  Filled 2020-02-17: qty 2

## 2020-02-17 MED ORDER — GENTAMICIN SULFATE 40 MG/ML IJ SOLN
5.0000 mg/kg | INTRAVENOUS | Status: AC
  Administered 2020-02-17: 350 mg via INTRAVENOUS
  Filled 2020-02-17: qty 8.75

## 2020-02-17 MED ORDER — FENTANYL CITRATE (PF) 100 MCG/2ML IJ SOLN
INTRAMUSCULAR | Status: DC | PRN
  Administered 2020-02-17: 50 ug via INTRAVENOUS
  Administered 2020-02-17: 25 ug via INTRAVENOUS

## 2020-02-17 MED ORDER — FENTANYL CITRATE (PF) 100 MCG/2ML IJ SOLN
INTRAMUSCULAR | Status: AC
  Filled 2020-02-17: qty 2

## 2020-02-17 MED ORDER — PROPOFOL 10 MG/ML IV BOLUS
INTRAVENOUS | Status: AC
  Filled 2020-02-17: qty 20

## 2020-02-17 MED ORDER — MIDAZOLAM HCL 2 MG/2ML IJ SOLN
INTRAMUSCULAR | Status: AC
  Filled 2020-02-17: qty 2

## 2020-02-17 MED ORDER — IOHEXOL 300 MG/ML  SOLN
INTRAMUSCULAR | Status: DC | PRN
  Administered 2020-02-17: 9 mL

## 2020-02-17 MED ORDER — EPHEDRINE SULFATE-NACL 50-0.9 MG/10ML-% IV SOSY
PREFILLED_SYRINGE | INTRAVENOUS | Status: DC | PRN
  Administered 2020-02-17: 10 mg via INTRAVENOUS

## 2020-02-17 MED ORDER — SODIUM CHLORIDE 0.9 % IR SOLN
Status: DC | PRN
  Administered 2020-02-17: 3000 mL

## 2020-02-17 MED ORDER — MIDAZOLAM HCL 5 MG/5ML IJ SOLN
INTRAMUSCULAR | Status: DC | PRN
  Administered 2020-02-17: 2 mg via INTRAVENOUS

## 2020-02-17 MED ORDER — ONDANSETRON HCL 4 MG/2ML IJ SOLN
INTRAMUSCULAR | Status: DC | PRN
  Administered 2020-02-17: 4 mg via INTRAVENOUS

## 2020-02-17 MED ORDER — PROPOFOL 10 MG/ML IV BOLUS
INTRAVENOUS | Status: DC | PRN
  Administered 2020-02-17: 160 mg via INTRAVENOUS

## 2020-02-17 MED ORDER — FENTANYL CITRATE (PF) 100 MCG/2ML IJ SOLN
25.0000 ug | INTRAMUSCULAR | Status: DC | PRN
  Administered 2020-02-17 (×2): 50 ug via INTRAVENOUS

## 2020-02-17 MED ORDER — LIDOCAINE 2% (20 MG/ML) 5 ML SYRINGE
INTRAMUSCULAR | Status: DC | PRN
  Administered 2020-02-17: 60 mg via INTRAVENOUS

## 2020-02-17 MED ORDER — CHLORHEXIDINE GLUCONATE 0.12 % MT SOLN
15.0000 mL | Freq: Once | OROMUCOSAL | Status: AC
  Administered 2020-02-17: 15 mL via OROMUCOSAL

## 2020-02-17 MED ORDER — ONDANSETRON HCL 4 MG/2ML IJ SOLN: 4.0000 mg | Freq: Once | INTRAMUSCULAR | Status: DC | PRN

## 2020-02-17 MED ORDER — ORAL CARE MOUTH RINSE: 15.0000 mL | Freq: Once | OROMUCOSAL | Status: AC

## 2020-02-17 SURGICAL SUPPLY — 16 items
BAG URO CATCHER STRL LF (MISCELLANEOUS) ×3 IMPLANT
BASKET ZERO TIP NITINOL 2.4FR (BASKET) IMPLANT
BSKT STON RTRVL ZERO TP 2.4FR (BASKET)
CATH INTERMIT  6FR 70CM (CATHETERS) ×2 IMPLANT
CLOTH BEACON ORANGE TIMEOUT ST (SAFETY) ×3 IMPLANT
GLOVE BIOGEL M STRL SZ7.5 (GLOVE) ×3 IMPLANT
GOWN STRL REUS W/TWL LRG LVL3 (GOWN DISPOSABLE) ×3 IMPLANT
GUIDEWIRE ANG ZIPWIRE 038X150 (WIRE) ×3 IMPLANT
GUIDEWIRE STR DUAL SENSOR (WIRE) IMPLANT
KIT TURNOVER KIT A (KITS) IMPLANT
MANIFOLD NEPTUNE II (INSTRUMENTS) ×3 IMPLANT
STENT POLARIS 5FRX24 (STENTS) ×2 IMPLANT
TRAY CYSTO PACK (CUSTOM PROCEDURE TRAY) ×3 IMPLANT
TUBING CONNECTING 10 (TUBING) ×2 IMPLANT
TUBING CONNECTING 10' (TUBING) ×1
TUBING UROLOGY SET (TUBING) IMPLANT

## 2020-02-17 NOTE — H&P (Signed)
Courtney Kramer is an 59 y.o. female.    Chief Complaint: Pre-Op Left Ureteral Stent Placement  HPI:   1 - Recurrent Urolithiasis -  01/2020 - Left UPJ 13mm sotne with ? ball valve obstruction (no hydro at time of CT), RT lower 19mm stones by CT on eval left flank pain. ==> Plan for left ureteroscopy scheduled 6/17 by McKenzie.   2 - Bacteruria - mixed growth by CX 01/2020, given cefpodoxime course. Having some low grade (99) fever at times. No high grade.   Today "Courtney Kramer" is seen to proceed with cysto and left stent placement for likelky intermitantly ball-valve obstructing left UPJ stone with bacteruria. No interavl high grade fevers. She remains on cefpodoxime.   Past Medical History:  Diagnosis Date  . Anxiety   . Arthritis   . Chronic back pain   . Closed fracture of right distal radius   . History of kidney stones   . Renal calculus, right   . Wears glasses     Past Surgical History:  Procedure Laterality Date  . COLONOSCOPY  2012  . CYSTO/  RIGHT URETERAL STENT PLACEMENT  05-10-2008  . CYSTOSCOPY W/ URETERAL STENT PLACEMENT Right 06/09/2014   Procedure: CYSTOSCOPY WITH RETROGRADE PYELOGRAM/URETERAL STENT PLACEMENT;  Surgeon: Valetta Fuller, MD;  Location: [facility];  Service: Urology;  Laterality: Right;  . EXTRACORPOREAL SHOCK WAVE LITHOTRIPSY Right 05-10-2008  . KNEE ARTHROSCOPY Left 2009  . OPEN REDUCTION INTERNAL FIXATION (ORIF) DISTAL RADIAL FRACTURE Right 03/27/2018   Procedure: OPEN REDUCTION INTERNAL FIXATION (ORIF) RIGHT DISTAL RADIUS FRACTURE;  Surgeon: Tarry Kos, MD;  [facility];  Service: Orthopedics;  Laterality: Right;  . WISDOM TOOTH EXTRACTION  AGE 52    Family History  Problem Relation Age of Onset  . Fibromyalgia Mother   . Heart disease Mother   . Macular degeneration Mother   . Rheum arthritis Father   . Rheum arthritis Sister   . Cancer Brother   . Breast cancer Neg Hx    Social History:  reports that  she has never smoked. She has never used smokeless tobacco. She reports current alcohol use. She reports that she does not use drugs.  Allergies:  Allergies  Allergen Reactions  . Sulfa Antibiotics Hives    No medications prior to admission.    No results found for this or any previous visit (from the past 48 hour(s)). No results found.  Review of Systems  Genitourinary: Positive for flank pain.  All other systems reviewed and are negative.   There were no vitals taken for this visit. Physical Exam   Assessment/Plan  Proceed as planned with cysto, left retrograde, left stent placement. I am again reluctant to perform definitive stone management at this time given her bacteruria and some low grade fevers and feel staged approach safest. Risks, benefits, expected peri-op course discussed previously and reiterated today.   Sebastian Ache, MD 02/17/2020, 5:42 AM

## 2020-02-17 NOTE — Anesthesia Procedure Notes (Signed)
Procedure Name: LMA Insertion Performed by: Kizzie Fantasia, CRNA Pre-anesthesia Checklist: Patient identified, Emergency Drugs available, Suction available, Patient being monitored and Timeout performed Patient Re-evaluated:Patient Re-evaluated prior to induction Oxygen Delivery Method: Circle system utilized Preoxygenation: Pre-oxygenation with 100% oxygen Induction Type: IV induction Ventilation: Mask ventilation without difficulty LMA: LMA inserted LMA Size: 4.0 Number of attempts: 2 Tube secured with: Tape Dental Injury: Teeth and Oropharynx as per pre-operative assessment

## 2020-02-17 NOTE — Anesthesia Preprocedure Evaluation (Signed)
Anesthesia Evaluation  Patient identified by MRN, date of birth, ID band Patient awake    Reviewed: Allergy & Precautions, NPO status , Patient's Chart, lab work & pertinent test results  Airway Mallampati: II  TM Distance: >3 FB Neck ROM: Full    Dental  (+) Dental Advisory Given   Pulmonary neg pulmonary ROS,    breath sounds clear to auscultation       Cardiovascular negative cardio ROS   Rhythm:Regular Rate:Normal     Neuro/Psych  Neuromuscular disease    GI/Hepatic negative GI ROS, Neg liver ROS,   Endo/Other  negative endocrine ROS  Renal/GU Renal disease     Musculoskeletal  (+) Arthritis ,   Abdominal   Peds  Hematology negative hematology ROS (+)   Anesthesia Other Findings   Reproductive/Obstetrics                             Anesthesia Physical Anesthesia Plan  ASA: II  Anesthesia Plan: General   Post-op Pain Management:    Induction: Intravenous  PONV Risk Score and Plan: 3 and Dexamethasone, Ondansetron and Treatment may vary due to age or medical condition  Airway Management Planned: LMA  Additional Equipment: None  Intra-op Plan:   Post-operative Plan: Extubation in OR  Informed Consent: I have reviewed the patients History and Physical, chart, labs and discussed the procedure including the risks, benefits and alternatives for the proposed anesthesia with the patient or authorized representative who has indicated his/her understanding and acceptance.     Dental advisory given  Plan Discussed with: CRNA  Anesthesia Plan Comments:         Anesthesia Quick Evaluation

## 2020-02-17 NOTE — ED Triage Notes (Signed)
Arrived POV from home. Patient reports spasms from stent placement. Patient states she took Hydrocodone and pain did not ease up. Patient called on call nurse which advised her to take an Oxycodone; patient reports pain has finally decreased but her daughter suggested she come to ED to get checked.

## 2020-02-17 NOTE — Brief Op Note (Signed)
02/17/2020  3:52 PM  PATIENT:  Courtney Kramer  59 y.o. female  PRE-OPERATIVE DIAGNOSIS:  LEFT URETEROPELVIC JUNCTION STONE  POST-OPERATIVE DIAGNOSIS:  LEFT URETEROPELVIC JUNCTION STONE  PROCEDURE:  Procedure(s) with comments: CYSTOSCOPY WITH RETROGRADE PYELOGRAM/URETERAL STENT PLACEMENT (Left) - 45 MINS  SURGEON:  Surgeon(s) and Role:    * Sebastian Ache, MD - Primary  PHYSICIAN ASSISTANT:   ASSISTANTS: none   ANESTHESIA:   general  EBL:  minimal   BLOOD ADMINISTERED:none  DRAINS: none   LOCAL MEDICATIONS USED:  NONE  SPECIMEN:  No Specimen  DISPOSITION OF SPECIMEN:  N/A  COUNTS:  YES  TOURNIQUET:  * No tourniquets in log *  DICTATION: .Other Dictation: Dictation Number [id number]  PLAN OF CARE: Discharge to home after PACU  PATIENT DISPOSITION:  PACU - hemodynamically stable.   Delay start of Pharmacological VTE agent (>24hrs) due to surgical blood loss or risk of bleeding: yes

## 2020-02-17 NOTE — Transfer of Care (Signed)
Immediate Anesthesia Transfer of Care Note  Patient: Courtney Kramer  Procedure(s) Performed: CYSTOSCOPY WITH RETROGRADE PYELOGRAM/URETERAL STENT PLACEMENT (Left )  Patient Location: PACU  Anesthesia Type:General  Level of Consciousness: awake, alert  and oriented  Airway & Oxygen Therapy: Patient Spontanous Breathing and Patient connected to face mask oxygen  Post-op Assessment: Report given to RN and Post -op Vital signs reviewed and stable  Post vital signs: Reviewed and stable  Last Vitals:  Vitals Value Taken Time  BP    Temp    Pulse    Resp    SpO2      Last Pain:  Vitals:   02/17/20 1413  TempSrc:   PainSc: 4       Patients Stated Pain Goal: 4 (02/17/20 1413)  Complications: No apparent anesthesia complications

## 2020-02-17 NOTE — Discharge Instructions (Signed)
1 - You may have urinary urgency (bladder spasms) and bloody urine on / off with stent in place. This is normal.  2 - Call MD or go to ER for fever >102, severe pain / nausea / vomiting not relieved by medications, or acute change in medical status   General Anesthesia, Adult, Care After This sheet gives you information about how to care for yourself after your procedure. Your health care provider may also give you more specific instructions. If you have problems or questions, contact your health care provider. What can I expect after the procedure? After the procedure, the following side effects are common:  Pain or discomfort at the IV site.  Nausea.  Vomiting.  Sore throat.  Trouble concentrating.  Feeling cold or chills.  Weak or tired.  Sleepiness and fatigue.  Soreness and body aches. These side effects can affect parts of the body that were not involved in surgery. Follow these instructions at home:  For at least 24 hours after the procedure:  Have a responsible adult stay with you. It is important to have someone help care for you until you are awake and alert.  Rest as needed.  Do not: ? Participate in activities in which you could fall or become injured. ? Drive. ? Use heavy machinery. ? Drink alcohol. ? Take sleeping pills or medicines that cause drowsiness. ? Make important decisions or sign legal documents. ? Take care of children on your own. Eating and drinking  Follow any instructions from your health care provider about eating or drinking restrictions.  When you feel hungry, start by eating small amounts of foods that are soft and easy to digest (bland), such as toast. Gradually return to your regular diet.  Drink enough fluid to keep your urine pale yellow.  If you vomit, rehydrate by drinking water, juice, or clear broth. General instructions  If you have sleep apnea, surgery and certain medicines can increase your risk for breathing problems.  Follow instructions from your health care provider about wearing your sleep device: ? Anytime you are sleeping, including during daytime naps. ? While taking prescription pain medicines, sleeping medicines, or medicines that make you drowsy.  Return to your normal activities as told by your health care provider. Ask your health care provider what activities are safe for you.  Take over-the-counter and prescription medicines only as told by your health care provider.  If you smoke, do not smoke without supervision.  Keep all follow-up visits as told by your health care provider. This is important. Contact a health care provider if:  You have nausea or vomiting that does not get better with medicine.  You cannot eat or drink without vomiting.  You have pain that does not get better with medicine.  You are unable to pass urine.  You develop a skin rash.  You have a fever.  You have redness around your IV site that gets worse. Get help right away if:  You have difficulty breathing.  You have chest pain.  You have blood in your urine or stool, or you vomit blood. Summary  After the procedure, it is common to have a sore throat or nausea. It is also common to feel tired.  Have a responsible adult stay with you for the first 24 hours after general anesthesia. It is important to have someone help care for you until you are awake and alert.  When you feel hungry, start by eating small amounts of foods that are soft   and easy to digest (bland), such as toast. Gradually return to your regular diet.  Drink enough fluid to keep your urine pale yellow.  Return to your normal activities as told by your health care provider. Ask your health care provider what activities are safe for you. This information is not intended to replace advice given to you by your health care provider. Make sure you discuss any questions you have with your health care provider. Document Revised: 09/06/2017  Document Reviewed: 04/19/2017 Elsevier Patient Education  2020 [employer]   

## 2020-02-17 NOTE — Anesthesia Postprocedure Evaluation (Signed)
Anesthesia Post Note  Patient: Courtney Kramer  Procedure(s) Performed: CYSTOSCOPY WITH RETROGRADE PYELOGRAM/URETERAL STENT PLACEMENT (Left )     Patient location during evaluation: PACU Anesthesia Type: General Level of consciousness: awake and alert Pain management: pain level controlled Vital Signs Assessment: post-procedure vital signs reviewed and stable Respiratory status: spontaneous breathing, nonlabored ventilation, respiratory function stable and patient connected to nasal cannula oxygen Cardiovascular status: blood pressure returned to baseline and stable Postop Assessment: no apparent nausea or vomiting Anesthetic complications: no    Last Vitals:  Vitals:   02/17/20 1615 02/17/20 1630  BP: (!) 145/83 (!) 141/90  Pulse: 81 80  Resp: 13 16  Temp:  (!) 36.4 C  SpO2: 98% 99%    Last Pain:  Vitals:   02/17/20 1630  TempSrc:   PainSc: 3                  Kennieth Rad

## 2020-02-18 NOTE — Op Note (Signed)
NAME: Courtney Kramer, MATHIES MEDICAL RECORD [id number] ACCOUNT [id number] DATE OF BIRTH:26-May-1961 FACILITY: [facility] LOCATION: [facility] PHYSICIAN:Ailani Governale Berneice Heinrich, MD  OPERATIVE REPORT  DATE OF PROCEDURE:  02/17/2020  SURGEON:  Sebastian Ache, MD  PREOPERATIVE DIAGNOSIS:  Left ureteropelvic junction stone with recurrent urinary tract infections and colicky flank pain.  PROCEDURE: 1.  Cystoscopy, left retrograde pyelogram, interpretation. 2.  Insertion of left ureteral stent, 5 x 24 Polaris, no tether.  ESTIMATED BLOOD LOSS:  Nil.  COMPLICATIONS:  None.  SPECIMEN:  None.  FINDINGS: 1.  Mild left hydronephrosis with UPJ stone. 2.  Efflux of proteinaceous-appearing urine anout stent following placement.  INDICATIONS:  The patient is a 59 year old lady with history of recurrent urolithiasis.  She also has chronic recurrent urinary tract infections.  She was found on workup for colicky left flank pain to have a left UPJ, likely intermittently ball-valving  stone.  She had loose infectious parameters at that time with mild bacteriuria, but no high-grade fevers.  She was initially on a plan of oral antibiotics, followed by definitive stone management.  She represented to our office with worsening flank pain  as well as some malaise, but again no high-grade fevers and overall clinical picture concerning for possible worsening developing infection and I recommended a temporizing measure with ureteral stent to allow improvement of her colic and clearance of  infectious parameters before definitive stone management.  She wished to proceed.  Informed consent was obtained and placed in the medical record.  DESCRIPTION OF PROCEDURE:  The patient being identified, the procedure being left ureteral stent placement was confirmed.  Procedure timeout was performed.  Intravenous antibiotics administered.  General anesthesia induced.  The patient was placed into a  low lithotomy position, sterile field was  created, prepped and draped the patient's vagina, introitus and proximal thighs.  Cystourethroscopy was  then performed using a 21-French rigid cystoscope with offset lens.  Inspection of the urinary bladder  revealed no diverticula, calcifications or papillary lesions.  The left ureteral orifice was cannulated with a 6-French renal catheter and left retrograde pyelogram was obtained.  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  There was a mobile filling defect in the area of the UPJ consistent with likely intermittently ball-valving stone.  There was minimal hydronephrosis.  A 0.03  ZIPwire was advanced to the lower pole, over which a new 5 x 24 Polaris-type stent was placed using fluoroscopic guidance.  Good proximal and distal planes were noted.  Efflux of proteinaceous appearing urine was seen around to the distal end of the  stent.  The patient tolerated the procedure well.  She remained without tachycardia or fevers.  It was felt that she was acceptable for discharge and plan for definitive stone management in 1-2 weeks as planned.  VN/NUANCE  D:02/17/2020 T:02/17/2020 JOB:[id number]

## 2020-02-22 ENCOUNTER — Ambulatory Visit (HOSPITAL_COMMUNITY): Payer: Managed Care, Other (non HMO) | Admitting: Anesthesiology

## 2020-02-22 ENCOUNTER — Other Ambulatory Visit: Payer: Self-pay | Admitting: Urology

## 2020-02-22 ENCOUNTER — Ambulatory Visit (HOSPITAL_COMMUNITY)
Admission: RE | Admit: 2020-02-22 | Discharge: 2020-02-22 | Disposition: A | Discharge status: Home / Self Care | Payer: Managed Care, Other (non HMO) | Source: Ambulatory Visit | Attending: Urology | Admitting: Urology

## 2020-02-22 ENCOUNTER — Encounter (HOSPITAL_COMMUNITY): Payer: Self-pay | Admitting: Urology

## 2020-02-22 ENCOUNTER — Ambulatory Visit (HOSPITAL_COMMUNITY): Payer: Managed Care, Other (non HMO)

## 2020-02-22 ENCOUNTER — Encounter (HOSPITAL_COMMUNITY)
Admission: RE | Disposition: A | Discharge status: Home / Self Care | Payer: Self-pay | Source: Ambulatory Visit | Attending: Urology

## 2020-02-22 DIAGNOSIS — F419 Anxiety disorder, unspecified: Secondary | ICD-10-CM | POA: Diagnosis not present

## 2020-02-22 DIAGNOSIS — N200 Calculus of kidney: Secondary | ICD-10-CM | POA: Insufficient documentation

## 2020-02-22 DIAGNOSIS — M549 Dorsalgia, unspecified: Secondary | ICD-10-CM | POA: Diagnosis not present

## 2020-02-22 DIAGNOSIS — Z79899 Other long term (current) drug therapy: Secondary | ICD-10-CM | POA: Diagnosis not present

## 2020-02-22 DIAGNOSIS — Z20822 Contact with and (suspected) exposure to covid-19: Secondary | ICD-10-CM | POA: Insufficient documentation

## 2020-02-22 DIAGNOSIS — M1990 Unspecified osteoarthritis, unspecified site: Secondary | ICD-10-CM | POA: Insufficient documentation

## 2020-02-22 DIAGNOSIS — G8929 Other chronic pain: Secondary | ICD-10-CM | POA: Insufficient documentation

## 2020-02-22 HISTORY — PX: CYSTOSCOPY/URETEROSCOPY/HOLMIUM LASER/STENT PLACEMENT: SHX6546

## 2020-02-22 LAB — SARS CORONAVIRUS 2 BY RT PCR (HOSPITAL ORDER, PERFORMED IN ~~LOC~~ HOSPITAL LAB): SARS Coronavirus 2: NEGATIVE

## 2020-02-22 SURGERY — CYSTOSCOPY/URETEROSCOPY/HOLMIUM LASER/STENT PLACEMENT
Anesthesia: General | Site: Ureter | Laterality: Left

## 2020-02-22 MED ORDER — MIDAZOLAM HCL 2 MG/2ML IJ SOLN
INTRAMUSCULAR | Status: AC
  Filled 2020-02-22: qty 2

## 2020-02-22 MED ORDER — LIDOCAINE 2% (20 MG/ML) 5 ML SYRINGE
INTRAMUSCULAR | Status: DC | PRN
  Administered 2020-02-22: 100 mg via INTRAVENOUS

## 2020-02-22 MED ORDER — SODIUM CHLORIDE 0.9 % IR SOLN
Status: DC | PRN
  Administered 2020-02-22: 3000 mL via INTRAVESICAL

## 2020-02-22 MED ORDER — ONDANSETRON HCL 4 MG/2ML IJ SOLN
INTRAMUSCULAR | Status: DC | PRN
  Administered 2020-02-22: 4 mg via INTRAVENOUS

## 2020-02-22 MED ORDER — MIDAZOLAM HCL 2 MG/2ML IJ SOLN
INTRAMUSCULAR | Status: DC | PRN
  Administered 2020-02-22: 2 mg via INTRAVENOUS

## 2020-02-22 MED ORDER — PHENYLEPHRINE HCL-NACL 10-0.9 MG/250ML-% IV SOLN
INTRAVENOUS | Status: DC | PRN
  Administered 2020-02-22: 25 ug/min via INTRAVENOUS

## 2020-02-22 MED ORDER — MORPHINE SULFATE (PF) 4 MG/ML IV SOLN: 2.0000 mg | INTRAVENOUS | Status: DC | PRN

## 2020-02-22 MED ORDER — DEXAMETHASONE SODIUM PHOSPHATE 10 MG/ML IJ SOLN
INTRAMUSCULAR | Status: DC | PRN
  Administered 2020-02-22: 10 mg via INTRAVENOUS

## 2020-02-22 MED ORDER — LACTATED RINGERS IV SOLN: INTRAVENOUS | Status: DC

## 2020-02-22 MED ORDER — OXYCODONE HCL 5 MG PO TABS: 5.0000 mg | ORAL_TABLET | ORAL | Status: DC | PRN

## 2020-02-22 MED ORDER — CEFAZOLIN SODIUM-DEXTROSE 2-4 GM/100ML-% IV SOLN
2.0000 g | Freq: Once | INTRAVENOUS | Status: AC
  Administered 2020-02-22: 2 g via INTRAVENOUS
  Filled 2020-02-22: qty 100

## 2020-02-22 MED ORDER — PROPOFOL 10 MG/ML IV BOLUS
INTRAVENOUS | Status: AC
  Filled 2020-02-22: qty 20

## 2020-02-22 MED ORDER — SODIUM CHLORIDE 0.9% FLUSH: 3.0000 mL | Freq: Two times a day (BID) | INTRAVENOUS | Status: DC

## 2020-02-22 MED ORDER — ACETAMINOPHEN 650 MG RE SUPP
650.0000 mg | RECTAL | Status: DC | PRN
  Filled 2020-02-22: qty 1

## 2020-02-22 MED ORDER — ACETAMINOPHEN 325 MG PO TABS: 650.0000 mg | ORAL_TABLET | ORAL | Status: DC | PRN

## 2020-02-22 MED ORDER — HYDROCODONE-ACETAMINOPHEN 5-325 MG PO TABS: 1.0000 | ORAL_TABLET | ORAL | 0 refills | Status: DC | PRN

## 2020-02-22 MED ORDER — PROPOFOL 10 MG/ML IV BOLUS
INTRAVENOUS | Status: DC | PRN
  Administered 2020-02-22: 160 mg via INTRAVENOUS

## 2020-02-22 MED ORDER — 0.9 % SODIUM CHLORIDE (POUR BTL) OPTIME
TOPICAL | Status: DC | PRN
  Administered 2020-02-22: 1000 mL

## 2020-02-22 MED ORDER — FENTANYL CITRATE (PF) 100 MCG/2ML IJ SOLN
INTRAMUSCULAR | Status: AC
  Filled 2020-02-22: qty 2

## 2020-02-22 MED ORDER — FENTANYL CITRATE (PF) 100 MCG/2ML IJ SOLN
INTRAMUSCULAR | Status: DC | PRN
  Administered 2020-02-22 (×2): 50 ug via INTRAVENOUS

## 2020-02-22 MED ORDER — SODIUM CHLORIDE 0.9% FLUSH: 3.0000 mL | INTRAVENOUS | Status: DC | PRN

## 2020-02-22 MED ORDER — CHLORHEXIDINE GLUCONATE 0.12 % MT SOLN
15.0000 mL | OROMUCOSAL | Status: AC
  Administered 2020-02-22: 15 mL via OROMUCOSAL

## 2020-02-22 SURGICAL SUPPLY — 20 items
BAG URO CATCHER STRL LF (MISCELLANEOUS) ×3 IMPLANT
CATH URET 5FR 28IN OPEN ENDED (CATHETERS) ×2 IMPLANT
CATH URET DUAL LUMEN 6-10FR 50 (CATHETERS) IMPLANT
CLOTH BEACON ORANGE TIMEOUT ST (SAFETY) ×3 IMPLANT
EXTRACTOR STONE NITINOL NGAGE (UROLOGICAL SUPPLIES) ×2 IMPLANT
FIBER LASER TRAC TIP (UROLOGICAL SUPPLIES) ×2 IMPLANT
GLOVE BIOGEL PI IND STRL 8 (GLOVE) IMPLANT
GLOVE BIOGEL PI INDICATOR 8 (GLOVE) ×2
GLOVE INDICATOR 8.0 STRL GRN (GLOVE) ×2 IMPLANT
GLOVE SURG SS PI 8.0 STRL IVOR (GLOVE) ×3 IMPLANT
GOWN STRL REUS W/TWL XL LVL3 (GOWN DISPOSABLE) ×5 IMPLANT
GUIDEWIRE STR DUAL SENSOR (WIRE) ×3 IMPLANT
KIT TURNOVER KIT A (KITS) ×2 IMPLANT
MANIFOLD NEPTUNE II (INSTRUMENTS) ×3 IMPLANT
SHEATH URETERAL 12FRX35CM (MISCELLANEOUS) ×2 IMPLANT
STENT URET 6FRX24 CONTOUR (STENTS) ×2 IMPLANT
TRAY CYSTO PACK (CUSTOM PROCEDURE TRAY) ×3 IMPLANT
TUBING CONNECTING 10 (TUBING) ×2 IMPLANT
TUBING CONNECTING 10' (TUBING) ×1
TUBING UROLOGY SET (TUBING) ×3 IMPLANT

## 2020-02-22 NOTE — Anesthesia Procedure Notes (Signed)
Date/Time: 02/22/2020 7:28 PM Performed by: Minerva Ends, CRNA Oxygen Delivery Method: Simple face mask Placement Confirmation: positive ETCO2 and breath sounds checked- equal and bilateral Dental Injury: Teeth and Oropharynx as per pre-operative assessment

## 2020-02-22 NOTE — Op Note (Signed)
Procedure: 1.  Cystoscopy with removal of left double-J stent. 2.  Left ureteroscopy with holmium laser application, stone retrieval and replacement of left double-J stent. 3.  Application of fluoroscopy.  Preop diagnosis: Left UPJ stone.  Postop diagnosis: Same.  Surgeon: Dr. [name].  Anesthesia: General.  Specimen: Stone fragments.  Drains: 6 French by 24 cm left contour double-J stent with tether.  EBL: None.  Complications: None.  Indications: The patient is a 59 year old female with a history of recurrent urolithiasis who had placement of a left ureteral stent for 10 mm left UPJ stone on February 17, 2019 she was to have ureteroscopy on March 04, 2019 but has had significant stent discomfort and came in the office today requesting earlier treatment.  Procedure: She was given 2 g of Ancef.  She was taken operating room where general anesthetic was induced.  She was placed in the lithotomy position and fitted with PAS hose.  Her perineum and genitalia were prepped with Betadine solution and she was draped in usual sterile fashion.  Cystoscopy was performed using a 23 [place] scope and 30 degree lens. examination revealed a normal urethra.  The bladder wall had mild trabeculation with no mucosal lesions.  There was a small clot in the base the bladder.  She did have a cystocele.  The Polaris stent loop was visible at the left ureteral orifice barely protruding.  The right ureteral orifice was unremarkable.  The stent was grasped with a grasping forceps and pulled the urethral meatus.  A sensor wire was then advanced the kidney under fluoroscopic guidance.  The stent was then removed.  A 35 cm digital access sheath inner core was then passed over the wire to the kidney without resistance.  The assembled 12/14 sheath was then passed into the proximal ureter.  The wire and inner core were then removed.  The dual-lumen digital flexible ureteroscope was passed to the kidney under visual and  fluoroscopic guidance.  The stone was noted to have been dislodged into the lower calyx but inspection of the remaining calyces revealed no additional stones.  The stone was then engaged with the 200 m tract tip laser fiber with power settings of 0.5 J and 20 Hz.  The stone fragmented readily.  The fragments were then removed with an engage basket.  Once all significant fragments were removed the ureteroscope was removed and the sensor wire was replaced to the kidney under fluoroscopic guidance.  The access sheath was then gently removed.  The cystoscope was then reinserted over the wire and a 6 [place] by 24 cm contour double-J stent with tether was passed to the kidney under fluoroscopic guidance.  The wire was removed, leaving a good coil in the kidney and a good coil in the bladder.  The bladder was then drained and the cystoscope was removed leaving the stent string exiting urethra.  The string was tied close to the meatus, trimmed to an appropriate length and then tucked vaginally and a tampon string fashion.  She was then taken down from lithotomy position, her anesthetic was reversed and she was moved recovery in stable condition.  The stone fragments were given to her family.  There were no complications

## 2020-02-22 NOTE — Transfer of Care (Signed)
Immediate Anesthesia Transfer of Care Note  Patient: Courtney Kramer  Procedure(s) Performed: CYSTOSCOPY/LEFT URETEROSCOPY/HOLMIUM LASER/STENT EXCHANGE (Left Ureter)  Patient Location: PACU  Anesthesia Type:General  Level of Consciousness: awake and alert   Airway & Oxygen Therapy: Patient Spontanous Breathing and Patient connected to face mask oxygen  Post-op Assessment: Report given to RN and Post -op Vital signs reviewed and stable  Post vital signs: Reviewed and stable  Last Vitals:  Vitals Value Taken Time  BP    Temp    Pulse    Resp    SpO2      Last Pain:  Vitals:   02/22/20 1532  TempSrc:   PainSc: 4       Patients Stated Pain Goal: 3 (02/22/20 1532)  Complications: No apparent anesthesia complications

## 2020-02-22 NOTE — Anesthesia Procedure Notes (Signed)
Procedure Name: LMA Insertion Date/Time: 02/22/2020 6:33 PM Performed by: Minerva Ends, CRNA Pre-anesthesia Checklist: Patient identified, Emergency Drugs available, Suction available and Patient being monitored Patient Re-evaluated:Patient Re-evaluated prior to induction Oxygen Delivery Method: Circle System Utilized Preoxygenation: Pre-oxygenation with 100% oxygen Induction Type: IV induction Ventilation: Mask ventilation without difficulty LMA: LMA inserted LMA Size: 4.0 Number of attempts: 1 Placement Confirmation: positive ETCO2 Tube secured with: Tape Dental Injury: Teeth and Oropharynx as per pre-operative assessment  Comments: Smooth Iv induction Howze-- lma insertion am crna atraumatic-- teeth and mouth as preop bilat Bs Howze

## 2020-02-22 NOTE — Discharge Instructions (Addendum)
Ureteral Stent Implantation, Care After This sheet gives you information about how to care for yourself after your procedure. Your health care provider may also give you more specific instructions. If you have problems or questions, contact your health care provider. What can I expect after the procedure? After the procedure, it is common to have:  Nausea.  Mild pain when you urinate. You may feel this pain in your lower back or lower abdomen. The pain should stop within a few minutes after you urinate. This may last for up to 1 week.  A small amount of blood in your urine for several days. Follow these instructions at home: Medicines  Take over-the-counter and prescription medicines only as told by your health care provider.  If you were prescribed an antibiotic medicine, take it as told by your health care provider. Do not stop taking the antibiotic even if you start to feel better.  Do not drive for 24 hours if you were given a sedative during your procedure.  Ask your health care provider if the medicine prescribed to you requires you to avoid driving or using heavy machinery. Activity  Rest as told by your health care provider.  Avoid sitting for a long time without moving. Get up to take short walks every 1-2 hours. This is important to improve blood flow and breathing. Ask for help if you feel weak or unsteady.  Return to your normal activities as told by your health care provider. Ask your health care provider what activities are safe for you. General instructions   Watch for any blood in your urine. Call your health care provider if the amount of blood in your urine increases.  If you have a catheter: ? Follow instructions from your health care provider about taking care of your catheter and collection bag. ? Do not take baths, swim, or use a hot tub until your health care provider approves. Ask your health care provider if you may take showers. You may only be allowed to  take sponge baths.  Drink enough fluid to keep your urine pale yellow.  Do not use any products that contain nicotine or tobacco, such as cigarettes, e-cigarettes, and chewing tobacco. These can delay healing after surgery. If you need help quitting, ask your health care provider.  Keep all follow-up visits as told by your health care provider. This is important. Contact a health care provider if:  You have pain that gets worse or does not get better with medicine, especially pain when you urinate.  You have difficulty urinating.  You feel nauseous or you vomit repeatedly during a period of more than 2 days after the procedure. Get help right away if:  Your urine is dark red or has blood clots in it.  You are leaking urine (have incontinence).  The end of the stent comes out of your urethra.  You cannot urinate.  You have sudden, sharp, or severe pain in your abdomen or lower back.  You have a fever.  You have swelling or pain in your legs.  You have difficulty breathing. Summary  After the procedure, it is common to have mild pain when you urinate that goes away within a few minutes after you urinate. This may last for up to 1 week.  Watch for any blood in your urine. Call your health care provider if the amount of blood in your urine increases.  Take over-the-counter and prescription medicines only as told by your health care provider.  Drink   enough fluid to keep your urine pale yellow.  You may remove the stent on Friday morning by pulling the attached string.    This information is not intended to replace advice given to you by your health care provider. Make sure you discuss any questions you have with your health care provider. Document Revised: 06/10/2018 Document Reviewed: 06/11/2018 Elsevier Patient Education  2020 [employer] Inc.   General Anesthesia, Adult, Care After This sheet gives you information about how to care for yourself after your procedure. Your  health care provider may also give you more specific instructions. If you have problems or questions, contact your health care provider. What can I expect after the procedure? After the procedure, the following side effects are common:  Pain or discomfort at the IV site.  Nausea.  Vomiting.  Sore throat.  Trouble concentrating.  Feeling cold or chills.  Weak or tired.  Sleepiness and fatigue.  Soreness and body aches. These side effects can affect parts of the body that were not involved in surgery. Follow these instructions at home:  For at least 24 hours after the procedure:  Have a responsible adult stay with you. It is important to have someone help care for you until you are awake and alert.  Rest as needed.  Do not: ? Participate in activities in which you could fall or become injured. ? Drive. ? Use heavy machinery. ? Drink alcohol. ? Take sleeping pills or medicines that cause drowsiness. ? Make important decisions or sign legal documents. ? Take care of children on your own. Eating and drinking  Follow any instructions from your health care provider about eating or drinking restrictions.  When you feel hungry, start by eating small amounts of foods that are soft and easy to digest (bland), such as toast. Gradually return to your regular diet.  Drink enough fluid to keep your urine pale yellow.  If you vomit, rehydrate by drinking water, juice, or clear broth. General instructions  If you have sleep apnea, surgery and certain medicines can increase your risk for breathing problems. Follow instructions from your health care provider about wearing your sleep device: ? Anytime you are sleeping, including during daytime naps. ? While taking prescription pain medicines, sleeping medicines, or medicines that make you drowsy.  Return to your normal activities as told by your health care provider. Ask your health care provider what activities are safe for  you.  Take over-the-counter and prescription medicines only as told by your health care provider.  If you smoke, do not smoke without supervision.  Keep all follow-up visits as told by your health care provider. This is important. Contact a health care provider if:  You have nausea or vomiting that does not get better with medicine.  You cannot eat or drink without vomiting.  You have pain that does not get better with medicine.  You are unable to pass urine.  You develop a skin rash.  You have a fever.  You have redness around your IV site that gets worse. Get help right away if:  You have difficulty breathing.  You have chest pain.  You have blood in your urine or stool, or you vomit blood. Summary  After the procedure, it is common to have a sore throat or nausea. It is also common to feel tired.  Have a responsible adult stay with you for the first 24 hours after general anesthesia. It is important to have someone help care for you until you are  awake and alert.  When you feel hungry, start by eating small amounts of foods that are soft and easy to digest (bland), such as toast. Gradually return to your regular diet.  Drink enough fluid to keep your urine pale yellow.  Return to your normal activities as told by your health care provider. Ask your health care provider what activities are safe for you. This information is not intended to replace advice given to you by your health care provider. Make sure you discuss any questions you have with your health care provider. Document Revised: 09/06/2017 Document Reviewed: 04/19/2017 Elsevier Patient Education  [employer].

## 2020-02-22 NOTE — H&P (Signed)
CC/HPI: Courtney Kramer returns today with severe stent pain on the left. She has a 1cm left UPJ stone and is scheduled for URS on 6/17 but she is not tolerating the stent at all. She has no fever. UA just has blood.     ALLERGIES: Sulfa    MEDICATIONS: Flomax 0.4 mg capsule 1 capsule PO Daily  Ketorolac Tromethamine 10 mg tablet 1 tablet PO Q 6 H PRN  Oxybutynin Chloride 5 mg tablet 1 tablet PO TID PRN  Percocet 5 mg-325 mg tablet 1-2 tablet PO Q6PRN Pain for severe stone pain  Cymbalta  Hydrocodone-Acetaminophen 5 mg-325 mg tablet 1 tablet PO Q 6 H PRN  Hydrocodone-Acetaminophen  Ondansetron Odt 4 mg tablet,disintegrating 1 tablet PO Q 8 H PRN For nausea     GU PSH: Cystoscopy Insert Stent - 2015 ESWL - 2015, 2009       PSH Notes: Lithotripsy, Cystoscopy With Insertion Of Ureteral Stent Right, Knee Surgery, Lithotripsy   NON-GU PSH: Knee Arthroscopy     GU PMH: Flank Pain - 02/18/2020 Renal calculus - 02/11/2020, - 2017, Nephrolithiasis, - 2015 Chronic cystitis (w/o hematuria), Chronic cystitis - 2015 Gross hematuria, Gross hematuria - 2015    NON-GU PMH: Encounter for general adult medical examination without abnormal findings, Encounter for preventive health examination - 2015 Personal history of other diseases of the musculoskeletal system and connective tissue, History of arthritis - 2015 Cardiac murmur, unspecified, Murmurs - 2014 Anxiety    FAMILY HISTORY: Death - Father Kidney Stones - Runs In Family   SOCIAL HISTORY: Marital Status: Married Preferred Language: English; Ethnicity: Not Hispanic Or Latino; Race: White Current Smoking Status: Patient has never smoked.   Tobacco Use Assessment Completed: Used Tobacco in last 30 days? Drinks 1 caffeinated drink per day. 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     Notes: Never a smoker, Caffeine Use, Alcohol Use, Marital History - Currently Married, Occupation:, Tobacco Use   REVIEW OF SYSTEMS:    GU Review Female:    Patient reports burning /pain with urination and get up at night to urinate. Patient denies frequent urination, hard to postpone urination, leakage of urine, stream starts and stops, trouble starting your stream, have to strain to urinate, and being pregnant.  Gastrointestinal (Upper):   Patient denies nausea, vomiting, and indigestion/ heartburn.  Gastrointestinal (Lower):   Patient denies diarrhea and constipation.  Constitutional:   Patient denies fever, night sweats, weight loss, and fatigue.  Skin:   Patient denies skin rash/ lesion and itching.  Eyes:   Patient denies blurred vision and double vision.  Ears/ Nose/ Throat:   Patient denies sore throat and sinus problems.  Hematologic/Lymphatic:   Patient denies swollen glands and easy bruising.  Cardiovascular:   Patient denies leg swelling and chest pains.  Respiratory:   Patient denies cough and shortness of breath.  Endocrine:   Patient denies excessive thirst.  Musculoskeletal:   Patient denies back pain and joint pain.  Neurological:   Patient denies headaches and dizziness.  Psychologic:   Patient denies depression and anxiety.   VITAL SIGNS:      02/22/2020 01:44 PM  Weight 180 lb / 81.65 kg  Height 66 in / 167.64 cm  BP 140/73 mmHg  Heart Rate 73 /min  Temperature 97.9 F / 36.6 C  BMI 29.0 kg/m   MULTI-SYSTEM PHYSICAL EXAMINATION:    Constitutional: Well-nourished. No physical deformities. Normally developed. Good grooming.  Respiratory: Normal breath sounds. No labored breathing, no use of accessory muscles.  Cardiovascular: Regular rate and rhythm. No murmur, no gallop.     Complexity of Data:  X-Ray Review: C.T. Stone Protocol: Reviewed Films. Discussed With Patient.     PROCEDURES:          Urinalysis w/Scope Dipstick Dipstick Cont'd Micro  Color: Red Bilirubin: Neg mg/dL WBC/hpf: 0 - 5/hpf  Appearance: Cloudy Ketones: Neg mg/dL RBC/hpf: >60/hpf  Specific Gravity: 1.020 Blood: 3+ ery/uL Bacteria: Rare  (0-9/hpf)  pH: 7.0 Protein: 2+ mg/dL Cystals: NS (Not Seen)  Glucose: Neg mg/dL Urobilinogen: 0.2 mg/dL Casts: NS (Not Seen)    Nitrites: Neg Trichomonas: Not Present    Leukocyte Esterase: 3+ leu/uL Mucous: Present      Epithelial Cells: 0 - 5/hpf      Yeast: NS (Not Seen)      Sperm: Not Present    Notes: Microscopic not concentrated.    ASSESSMENT:      ICD-10 Details  1 GU:   Renal calculus - N20.0 Chronic, Stable - she is not tolerating the stent and can't make it until 6/17. I will take her for left URS tonight. I have reviewed the risks of ureteroscopy including bleeding, infection, ureteral injury, need for a stent or secondary procedures, thrombotic events and anesthetic complications.   2   Flank Pain - R10.84 Chronic, Worsening   PLAN:           Orders         Schedule X-Rays: 2 Weeks - KUB  Return Visit/Planned Activity: 2 Weeks - Office Visit, Extender             Note: Cancel 6/17 surgery. Schedule NP f/u           Document Letter(s):  Created for Patient: Clinical Summary

## 2020-02-22 NOTE — Anesthesia Preprocedure Evaluation (Addendum)
Anesthesia Evaluation  Patient identified by MRN, date of birth, ID band Patient awake    Reviewed: Allergy & Precautions, NPO status , Patient's Chart, lab work & pertinent test results  History of Anesthesia Complications Negative for: history of anesthetic complications  Airway Mallampati: II  TM Distance: >3 FB Neck ROM: Full    Dental no notable dental hx.    Pulmonary neg pulmonary ROS,    Pulmonary exam normal        Cardiovascular negative cardio ROS Normal cardiovascular exam     Neuro/Psych Anxiety negative neurological ROS     GI/Hepatic Neg liver ROS, GERD  Medicated and Controlled,  Endo/Other  negative endocrine ROS  Renal/GU Renal stone  negative genitourinary   Musculoskeletal  (+) Arthritis , Chronic back pain on opioids   Abdominal   Peds  Hematology negative hematology ROS (+)   Anesthesia Other Findings Day of surgery medications reviewed with patient.  Reproductive/Obstetrics negative OB ROS                            Anesthesia Physical Anesthesia Plan  ASA: II  Anesthesia Plan: General   Post-op Pain Management:    Induction: Intravenous  PONV Risk Score and Plan: 3 and Treatment may vary due to age or medical condition, Ondansetron, Dexamethasone and Midazolam  Airway Management Planned: LMA  Additional Equipment: None  Intra-op Plan:   Post-operative Plan: Extubation in OR  Informed Consent: I have reviewed the patients History and Physical, chart, labs and discussed the procedure including the risks, benefits and alternatives for the proposed anesthesia with the patient or authorized representative who has indicated his/her understanding and acceptance.     Dental advisory given  Plan Discussed with: CRNA  Anesthesia Plan Comments:        Anesthesia Quick Evaluation

## 2020-02-23 ENCOUNTER — Encounter: Payer: Self-pay | Admitting: *Deleted

## 2020-02-23 NOTE — Anesthesia Postprocedure Evaluation (Signed)
Anesthesia Post Note  Patient: Courtney Kramer  Procedure(s) Performed: CYSTOSCOPY/LEFT URETEROSCOPY/HOLMIUM LASER/STENT EXCHANGE (Left Ureter)     Patient location during evaluation: PACU Anesthesia Type: General Level of consciousness: awake and alert and oriented Pain management: pain level controlled Vital Signs Assessment: post-procedure vital signs reviewed and stable Respiratory status: spontaneous breathing, nonlabored ventilation and respiratory function stable Cardiovascular status: blood pressure returned to baseline Postop Assessment: no apparent nausea or vomiting Anesthetic complications: no    Last Vitals:  Vitals:   02/22/20 2005 02/22/20 2043  BP: (!) 152/70 (!) 151/74  Pulse: 64 69  Resp: 16 17  Temp:  36.5 C  SpO2: 100% 98%    Last Pain:  Vitals:   02/22/20 2043  TempSrc:   PainSc: 0-No pain                 Kaylyn Layer

## 2020-02-28 ENCOUNTER — Other Ambulatory Visit: Payer: Self-pay | Admitting: Rheumatology

## 2020-02-28 DIAGNOSIS — M0579 Rheumatoid arthritis with rheumatoid factor of multiple sites without organ or systems involvement: Secondary | ICD-10-CM

## 2020-02-29 NOTE — Telephone Encounter (Signed)
Please schedule patient for a follow up visit. Patient due April 2021. Thanks!  

## 2020-02-29 NOTE — Telephone Encounter (Signed)
Last Visit: 11/26/19 Next Visit: due April 2021. Message sent to the front to schedule.  Labs: 02/17/2020 CBC WNL BMP  Calcium 8.6 PLQ eye exam: 08/18/2019 WNL  Current Dose per office note on 11/26/2019: Plaquenil 200 mg 1 tablet BID   Okay to refill per Dr. Corliss Skains

## 2020-02-29 NOTE — Telephone Encounter (Signed)
Attempted to contact patient to schedule follow-up appointment.  Voicemail full / unable to leave a message 

## 2020-03-02 NOTE — Telephone Encounter (Signed)
Patient called to find out why she got as call. Advised patient she needs to schedule a follow up appointment. Patient will call back to schedule an appointment when she has her schedule in front of her.

## 2020-03-03 ENCOUNTER — Encounter (HOSPITAL_COMMUNITY): Admission: RE | Payer: Self-pay | Source: Home / Self Care

## 2020-03-03 ENCOUNTER — Ambulatory Visit (HOSPITAL_COMMUNITY): Admission: RE | Admit: 2020-03-03 | Payer: Managed Care, Other (non HMO) | Source: Home / Self Care | Admitting: Urology

## 2020-03-03 SURGERY — CYSTOURETEROSCOPY, WITH RETROGRADE PYELOGRAM AND STENT INSERTION
Anesthesia: General | Laterality: Left

## 2020-03-04 ENCOUNTER — Other Ambulatory Visit: Payer: Self-pay | Admitting: Urology

## 2020-04-06 ENCOUNTER — Other Ambulatory Visit: Payer: Self-pay | Admitting: Urology

## 2020-04-11 NOTE — Progress Notes (Signed)
Office Visit Note  Patient: Courtney Kramer             Date of Birth: 09-01-1961           MRN: [id number]             PCP: Dema Severin, NP Referring: Dema Severin, NP Visit Date: 04/21/2020 Occupation: @GUAROCC @  Subjective:  Bilateral 3rd PIP joint pain   History of Present Illness: Courtney Kramer is a 59 y.o. female with history of seropositive rheumatoid arthritis, osteoarthritis, and DDD.  Patient is taking Plaquenil 200 mg 1 tablet by mouth twice daily.  She is tolerating Plaquenil without any side effects.  She reports that she continues to have pain in bilateral third PIP joints with intermittent joint swelling.  She has been using a topical agent for pain relief.  She continues to take Celebrex 200 mg 1 capsule by mouth daily as needed for pain relief.  She had a left knee joint cortisone injection performed at her last office visit on 11/26/2019 which resolved her discomfort.  She denies any discomfort or joint swelling at this time.   Activities of Daily Living:  Patient reports morning stiffness for 5-10  minutes.   Patient Denies nocturnal pain.  Difficulty dressing/grooming: Denies Difficulty climbing stairs: Reports Difficulty getting out of chair: Denies Difficulty using hands for taps, buttons, cutlery, and/or writing: Denies  Review of Systems  Constitutional: Negative for fatigue.  HENT: Negative for mouth sores, mouth dryness and nose dryness.   Eyes: Positive for dryness. Negative for pain, itching and visual disturbance.  Respiratory: Negative for cough, hemoptysis, shortness of breath and difficulty breathing.   Cardiovascular: Negative for chest pain, palpitations, hypertension and swelling in legs/feet.  Gastrointestinal: Negative for blood in stool, constipation and diarrhea.  Endocrine: Negative for increased urination.  Genitourinary: Negative for difficulty urinating and painful urination.  Musculoskeletal: Positive for arthralgias, joint pain,  joint swelling, myalgias, muscle weakness, morning stiffness, muscle tenderness and myalgias.  Skin: Negative for color change, pallor, rash, hair loss, nodules/bumps, redness, skin tightness, ulcers and sensitivity to sunlight.  Allergic/Immunologic: Negative for susceptible to infections.  Neurological: Negative for dizziness, headaches, memory loss and weakness.  Hematological: Negative for bruising/bleeding tendency and swollen glands.  Psychiatric/Behavioral: Negative for depressed mood, confusion and sleep disturbance. The patient is not nervous/anxious.     PMFS History:  Patient Active Problem List   Diagnosis Date Noted  . Rheumatoid arthritis involving multiple sites with positive rheumatoid factor (HCC) 04/21/2020  . High risk medication use 04/21/2020  . Primary osteoarthritis of both hands 04/21/2020  . Primary osteoarthritis of both knees 04/21/2020  . Primary osteoarthritis of both feet 04/21/2020  . DDD (degenerative disc disease), lumbar 04/21/2020  . Fracture, Colles, right, closed 04/09/2018  . Closed fracture of right distal radius and ulna, initial encounter 03/27/2018  . Lumbar radiculopathy 09/19/2016  . RENAL CALCULUS 05/04/2008  . FLANK PAIN, RIGHT 05/04/2008    Past Medical History:  Diagnosis Date  . Anxiety   . Arthritis   . Chronic back pain   . Closed fracture of right distal radius   . History of kidney stones   . Renal calculus, right   . Wears glasses     Family History  Problem Relation Age of Onset  . Fibromyalgia Mother   . Heart disease Mother   . Macular degeneration Mother   . Rheum arthritis Father   . Rheum arthritis Sister   . Cancer  Brother   . Breast cancer Neg Hx    Past Surgical History:  Procedure Laterality Date  . COLONOSCOPY  2012  . CYSTO/  RIGHT URETERAL STENT PLACEMENT  05-10-2008  . CYSTOSCOPY W/ URETERAL STENT PLACEMENT Right 06/09/2014   Procedure: CYSTOSCOPY WITH RETROGRADE PYELOGRAM/URETERAL STENT PLACEMENT;   Surgeon: Valetta Fuller, MD;  Location: [facility];  Service: Urology;  Laterality: Right;  . CYSTOSCOPY W/ URETERAL STENT PLACEMENT Left 02/17/2020   Procedure: CYSTOSCOPY WITH RETROGRADE PYELOGRAM/URETERAL STENT PLACEMENT;  Surgeon: Sebastian Ache, MD;  Location: [facility];  Service: Urology;  Laterality: Left;  45 MINS  . CYSTOSCOPY/URETEROSCOPY/HOLMIUM LASER/STENT PLACEMENT Left 02/22/2020   Procedure: CYSTOSCOPY/LEFT URETEROSCOPY/HOLMIUM LASER/STENT EXCHANGE;  Surgeon: [name], MD;  Location: [facility];  Service: Urology;  Laterality: Left;  . EXTRACORPOREAL SHOCK WAVE LITHOTRIPSY Right 05-10-2008  . KNEE ARTHROSCOPY Left 2009  . OPEN REDUCTION INTERNAL FIXATION (ORIF) DISTAL RADIAL FRACTURE Right 03/27/2018   Procedure: OPEN REDUCTION INTERNAL FIXATION (ORIF) RIGHT DISTAL RADIUS FRACTURE;  Surgeon: Tarry Kos, MD;  Location: [facility];  Service: Orthopedics;  Laterality: Right;  . WISDOM TOOTH EXTRACTION  AGE 89   Social History   Social History Narrative  . Not on file    There is no immunization history on file for this patient.   Objective: Vital Signs: BP (!) 144/84 (BP Location: Left Arm, Patient Position: Sitting, Cuff Size: Normal)   Pulse 76   Resp 15   Ht 5\' 6"  (1.676 m)   Wt 189 lb 12.8 oz (86.1 kg)   BMI 30.63 kg/m    Physical Exam Vitals and nursing note reviewed.  Constitutional:      Appearance: She is well-developed.  HENT:     Head: Normocephalic and atraumatic.  Eyes:     Conjunctiva/sclera: Conjunctivae normal.  Pulmonary:     Effort: Pulmonary effort is normal.  Abdominal:     General: Bowel sounds are normal.     Palpations: Abdomen is soft.  Musculoskeletal:     Cervical back: Normal range of motion.  Lymphadenopathy:     Cervical: No cervical adenopathy.  Skin:    General: Skin is warm and dry.     Capillary Refill: Capillary refill takes less than 2 seconds.  Neurological:     Mental Status: She is alert and  oriented to person, place, and time.  Psychiatric:        Behavior: Behavior normal.      Musculoskeletal Exam: C-spine, thoracic spine, lumbar spine have good range of motion.  Shoulder joints, elbow joints, wrist joints, MCPs, DIPs and PIPs have good range of motion with no discomfort.  She has tenderness and mild inflammation of bilateral third PIP joints.  She has complete fist formation bilaterally.  Hip joints, knee joints, and ankle joints good ROM with no discomfort.  No warmth or effusion of knee joints noted.  No tenderness or swelling of ankle joints.   CDAI Exam: CDAI Score: 5  Patient Global: 5 mm; Provider Global: 5 mm Swollen: 2 ; Tender: 2  Joint Exam 04/21/2020      Right  Left  PIP 3  Swollen Tender  Swollen Tender     Investigation: No additional findings.  Imaging: XR Foot 2 Views Left  Result Date: 04/21/2020 First MTP, PIP and DIP narrowing was noted.  First metatarsal tarsal narrowing was noted.  Dorsal spurring was noted.  No tibiotalar or subtalar joint space narrowing was noted.  Inferior and posterior  calcaneal spurs were noted.  No erosive changes were noted. Impression: These findings are consistent with osteoarthritis of the foot.  XR Foot 2 Views Right  Result Date: 04/21/2020 First MTP, all PIP and DIP joint space narrowing was noted.  Dorsal spurring was noted.  No tibiotalar or subtalar joint space narrowing was noted.  Inferior and posterior calcaneal spurs were noted.  No erosive changes were noted. Impression: These findings are consistent with osteoarthritis of the foot.   Recent Labs: Lab Results  Component Value Date   WBC 6.2 02/17/2020   HGB 13.8 02/17/2020   PLT 286 02/17/2020   NA 136 02/17/2020   K 3.8 02/17/2020   CL 102 02/17/2020   CO2 26 02/17/2020   GLUCOSE 89 02/17/2020   BUN 17 02/17/2020   CREATININE 0.77 02/17/2020   BILITOT 0.5 11/26/2019   AST 22 11/26/2019   ALT 15 11/26/2019   PROT 6.7 11/26/2019   CALCIUM 8.6  (L) 02/17/2020   GFRAA >60 02/17/2020   QFTBGOLDPLUS NEGATIVE 07/22/2019    Speciality Comments: PLQ eye exam: 08/18/2019 WNL. Dr. Loraine Leriche Mitchum. Follow up in 1 year.  Procedures:  No procedures performed Allergies: Sulfa antibiotics   Assessment / Plan:     Visit Diagnoses: Rheumatoid arthritis involving multiple sites with positive rheumatoid factor (HCC) - Positive rheumatoid factor, negative CCP: She has ongoing tenderness and intermittent inflammation in bilateral third PIP joints.  She has been using a topical agent and taking Celebrex 200 mg 1 capsule by mouth daily as needed for pain relief.  She was experiencing pain and inflammation in the left knee joint at her office visit on 11/26/2019 and at that time a cortisone injection was performed.  Her discomfort has resolved.  She has good range of motion of the left knee joint on exam with no warmth or effusion.  She has clinically been doing well on Plaquenil 200 mg 1 tablet by mouth twice daily.  She is tolerating Plaquenil without any side effects.  She does not want to make any medication changes at this time.  She will continue taking Plaquenil as prescribed.  She was advised to notify us if she develops increased joint pain or joint swelling.  She will follow-up in the office in 5 months.- Plan: XR Foot 2 Views Right, XR Foot 2 Views Left  High risk medication use - Plaquenil 200 mg 1 tablet BID (started mid-November 2020). Eye exam: 08/18/2019.  CBC and BMP were drawn on 02/17/2020.  She will be due to update lab work in November and every 3 months to monitor for drug toxicity.  Primary osteoarthritis of both hands - She has persistent tenderness and intermittent inflammation in the right third PIP joints.  She is able to make a complete fist bilaterally.  She has been using a topical agent for pain relief and continues to take Celebrex 200 mg 1 capsule by mouth daily for pain relief.  We discussed scheduling ultrasound-guided cortisone  injections for pain relief and she is in agreement. We discussed the importance of joint protection and muscle strengthening.  She was given a handout of hand exercises to perform.  Primary osteoarthritis of both knees - Bilateral moderate osteoarthritis with mild chondromalacia patella: She has good range of motion of both knee joints on exam.  No warmth or effusion was noted.  She had a left knee joint cortisone injection performed at her last office visit on 11/26/2019 which resolved her discomfort.  Primary osteoarthritis of both feet:  She is not having any discomfort at this time.  X-rays of both feet were updated today which were consistent with osteoarthritic changes.  No erosive changes were noted.  We discussed the importance of wearing proper fitting shoes. Several weeks ago she was playing in the yard with her grandchildren and was wearing flip-flops and has had some increased discomfort on the lateral aspect of her right leg.  She states that her discomfort has improved while wearing proper fitting shoes.  She denies any calf pain or tenderness at this time.  She denies any pain in her ankle joints or feet.  DDD (degenerative disc disease), lumbar: She is not having any discomfort in her lower back at this time.  Other medical conditions are listed as follows:   Family history of rheumatoid arthritis - Father and 2 of her sisters.  Closed Colles' fracture of right radius with routine healing, subsequent encounter  RENAL CALCULUS  Orders: Orders Placed This Encounter  Procedures  . XR Foot 2 Views Right  . XR Foot 2 Views Left   No orders of the defined types were placed in this encounter.    Follow-Up Instructions: Return in about 5 months (around 09/21/2020) for Rheumatoid arthritis.   Gearldine Bienenstock, PA-C  Note - This record has been created using Dragon software.  Chart creation errors have been sought, but may not always  have been located. Such creation errors do not  reflect on  the standard of medical care.

## 2020-04-20 ENCOUNTER — Other Ambulatory Visit: Payer: Self-pay | Admitting: Urology

## 2020-04-21 ENCOUNTER — Ambulatory Visit: Payer: Self-pay

## 2020-04-21 ENCOUNTER — Other Ambulatory Visit: Payer: Self-pay

## 2020-04-21 ENCOUNTER — Ambulatory Visit (INDEPENDENT_AMBULATORY_CARE_PROVIDER_SITE_OTHER): Payer: Managed Care, Other (non HMO) | Admitting: Physician Assistant

## 2020-04-21 ENCOUNTER — Encounter: Payer: Self-pay | Admitting: Physician Assistant

## 2020-04-21 VITALS — BP 144/84 | HR 76 | Resp 15 | Ht 66.0 in | Wt 189.8 lb

## 2020-04-21 DIAGNOSIS — M19071 Primary osteoarthritis, right ankle and foot: Secondary | ICD-10-CM | POA: Diagnosis not present

## 2020-04-21 DIAGNOSIS — N200 Calculus of kidney: Secondary | ICD-10-CM

## 2020-04-21 DIAGNOSIS — M19072 Primary osteoarthritis, left ankle and foot: Secondary | ICD-10-CM | POA: Diagnosis not present

## 2020-04-21 DIAGNOSIS — S52531D Colles' fracture of right radius, subsequent encounter for closed fracture with routine healing: Secondary | ICD-10-CM

## 2020-04-21 DIAGNOSIS — Z79899 Other long term (current) drug therapy: Secondary | ICD-10-CM

## 2020-04-21 DIAGNOSIS — M51369 Other intervertebral disc degeneration, lumbar region without mention of lumbar back pain or lower extremity pain: Secondary | ICD-10-CM | POA: Insufficient documentation

## 2020-04-21 DIAGNOSIS — M0579 Rheumatoid arthritis with rheumatoid factor of multiple sites without organ or systems involvement: Secondary | ICD-10-CM | POA: Insufficient documentation

## 2020-04-21 DIAGNOSIS — M19041 Primary osteoarthritis, right hand: Secondary | ICD-10-CM | POA: Diagnosis not present

## 2020-04-21 DIAGNOSIS — M170 Bilateral primary osteoarthritis of knee: Secondary | ICD-10-CM

## 2020-04-21 DIAGNOSIS — M5136 Other intervertebral disc degeneration, lumbar region: Secondary | ICD-10-CM

## 2020-04-21 DIAGNOSIS — M19042 Primary osteoarthritis, left hand: Secondary | ICD-10-CM

## 2020-04-21 DIAGNOSIS — Z8261 Family history of arthritis: Secondary | ICD-10-CM

## 2020-04-21 NOTE — Patient Instructions (Addendum)
COVID-19 vaccine recommendations:   COVID-19 vaccine is recommended for everyone (unless you are allergic to a vaccine component), even if you are on a medication that suppresses your immune system.   If you are on Methotrexate, Cellcept (mycophenolate), Rinvoq, Harriette Ohara, and Olumiant- hold the medication for 1 week after each vaccine. Hold Methotrexate for 2 weeks after the single dose COVID-19 vaccine.   If you are on Orencia subcutaneous injection - hold medication one week prior to and one week after the first COVID-19 vaccine dose (only).   If you are on Orencia IV infusions- time vaccination administration so that the first COVID-19 vaccination will occur four weeks after the infusion and postpone the subsequent infusion by one week.   If you are on Cyclophosphamide or Rituxan infusions please contact your doctor prior to receiving the COVID-19 vaccine.   Do not take Tylenol or ant anti-inflammatory medications (NSAIDs) 24 hours prior to the COVID-19 vaccination.   There is no direct evidence about the efficacy of the COVID-19 vaccine in individuals who are on medications that suppress the immune system.   Even if you are fully vaccinated, and you are on any medications that suppress your immune system, please continue to wear a mask, maintain at least six feet social distance and practice hand hygiene.   If you develop a COVID-19 infection, please contact your PCP or our office to determine if you need antibody infusion.  We anticipate that a booster vaccine will be available soon for immunosuppressed individuals. Please cal our office before receiving your booster dose to make adjustments to your medication regimen.    Standing Labs We placed an order today for your standing lab work.   Please have your standing labs drawn in November and every 5 months   If possible, please have your labs drawn 2 weeks prior to your appointment so that the provider can discuss your results at your  appointment.  We have open lab daily Monday through Thursday from 8:30-12:30 PM and 1:30-4:30 PM and Friday from 8:30-12:30 PM and 1:30-4:00 PM at the office of Dr. Pollyann Savoy, [facility].   Please be advised, patients with office appointments requiring lab work will take precedents over walk-in lab work.  If possible, please come for your lab work on Monday and Friday afternoons, as you may experience shorter wait times. The office is located at [address], [place], [place] [address] No appointment is necessary.   Labs are drawn by [facility]. Please bring your co-pay at the time of your lab draw.  You may receive a bill from [facility] for your lab work.  If you wish to have your labs drawn at another location, please call the office 24 hours in advance to send orders.  If you have any questions regarding directions or hours of operation,  please call [phone].   As a reminder, please drink plenty of water prior to coming for your lab work. Thanks!     Hand Exercises Hand exercises can be helpful for almost anyone. These exercises can strengthen the hands, improve flexibility and movement, and increase blood flow to the hands. These results can make work and daily tasks easier. Hand exercises can be especially helpful for people who have joint pain from arthritis or have nerve damage from overuse (carpal tunnel syndrome). These exercises can also help people who have injured a hand. Exercises Most of these hand exercises are gentle stretching and motion exercises. It is usually safe to do them  often throughout the day. Warming up your hands before exercise may help to reduce stiffness. You can do this with gentle massage or by placing your hands in warm water for 10-15 minutes. It is normal to feel some stretching, pulling, tightness, or mild discomfort as you begin new exercises. This will gradually improve. Stop an exercise right away if you feel sudden,  severe pain or your pain gets worse. Ask your health care provider which exercises are best for you. Knuckle bend or "claw" fist 1. Stand or sit with your arm, hand, and all five fingers pointed straight up. Make sure to keep your wrist straight during the exercise. 2. Gently bend your fingers down toward your palm until the tips of your fingers are touching the top of your palm. Keep your big knuckle straight and just bend the small knuckles in your fingers. 3. Hold this position for __________ seconds. 4. Straighten (extend) your fingers back to the starting position. Repeat this exercise 5-10 times with each hand. Full finger fist 1. Stand or sit with your arm, hand, and all five fingers pointed straight up. Make sure to keep your wrist straight during the exercise. 2. Gently bend your fingers into your palm until the tips of your fingers are touching the middle of your palm. 3. Hold this position for __________ seconds. 4. Extend your fingers back to the starting position, stretching every joint fully. Repeat this exercise 5-10 times with each hand. Straight fist 1. Stand or sit with your arm, hand, and all five fingers pointed straight up. Make sure to keep your wrist straight during the exercise. 2. Gently bend your fingers at the big knuckle, where your fingers meet your hand, and the middle knuckle. Keep the knuckle at the tips of your fingers straight and try to touch the bottom of your palm. 3. Hold this position for __________ seconds. 4. Extend your fingers back to the starting position, stretching every joint fully. Repeat this exercise 5-10 times with each hand. Tabletop 1. Stand or sit with your arm, hand, and all five fingers pointed straight up. Make sure to keep your wrist straight during the exercise. 2. Gently bend your fingers at the big knuckle, where your fingers meet your hand, as far down as you can while keeping the small knuckles in your fingers straight. Think of  forming a tabletop with your fingers. 3. Hold this position for __________ seconds. 4. Extend your fingers back to the starting position, stretching every joint fully. Repeat this exercise 5-10 times with each hand. Finger spread 1. Place your hand flat on a table with your palm facing down. Make sure your wrist stays straight as you do this exercise. 2. Spread your fingers and thumb apart from each other as far as you can until you feel a gentle stretch. Hold this position for __________ seconds. 3. Bring your fingers and thumb tight together again. Hold this position for __________ seconds. Repeat this exercise 5-10 times with each hand. Making circles 1. Stand or sit with your arm, hand, and all five fingers pointed straight up. Make sure to keep your wrist straight during the exercise. 2. Make a circle by touching the tip of your thumb to the tip of your index finger. 3. Hold for __________ seconds. Then open your hand wide. 4. Repeat this motion with your thumb and each finger on your hand. Repeat this exercise 5-10 times with each hand. Thumb motion 1. Sit with your forearm resting on a table and  your wrist straight. Your thumb should be facing up toward the ceiling. Keep your fingers relaxed as you move your thumb. 2. Lift your thumb up as high as you can toward the ceiling. Hold for __________ seconds. 3. Bend your thumb across your palm as far as you can, reaching the tip of your thumb for the small finger (pinkie) side of your palm. Hold for __________ seconds. Repeat this exercise 5-10 times with each hand. Grip strengthening  1. Hold a stress ball or other soft ball in the middle of your hand. 2. Slowly increase the pressure, squeezing the ball as much as you can without causing pain. Think of bringing the tips of your fingers into the middle of your palm. All of your finger joints should bend when doing this exercise. 3. Hold your squeeze for __________ seconds, then  relax. Repeat this exercise 5-10 times with each hand. Contact a health care provider if:  Your hand pain or discomfort gets much worse when you do an exercise.  Your hand pain or discomfort does not improve within 2 hours after you exercise. If you have any of these problems, stop doing these exercises right away. Do not do them again unless your health care provider says that you can. Get help right away if:  You develop sudden, severe hand pain or swelling. If this happens, stop doing these exercises right away. Do not do them again unless your health care provider says that you can. This information is not intended to replace advice given to you by your health care provider. Make sure you discuss any questions you have with your health care provider. Document Revised: 12/25/2018 Document Reviewed: 09/04/2018 Elsevier Patient Education  2020 [employer].

## 2020-05-18 ENCOUNTER — Other Ambulatory Visit: Payer: Self-pay

## 2020-05-18 ENCOUNTER — Ambulatory Visit (INDEPENDENT_AMBULATORY_CARE_PROVIDER_SITE_OTHER): Payer: Managed Care, Other (non HMO) | Admitting: Rheumatology

## 2020-05-18 DIAGNOSIS — M19042 Primary osteoarthritis, left hand: Secondary | ICD-10-CM | POA: Diagnosis not present

## 2020-05-18 DIAGNOSIS — M19041 Primary osteoarthritis, right hand: Secondary | ICD-10-CM | POA: Diagnosis not present

## 2020-05-18 MED ORDER — TRIAMCINOLONE ACETONIDE 40 MG/ML IJ SUSP
10.0000 mg | INTRAMUSCULAR | Status: AC | PRN
  Administered 2020-05-18: 10 mg via INTRA_ARTICULAR

## 2020-05-18 MED ORDER — LIDOCAINE HCL 1 % IJ SOLN
0.3000 mL | INTRAMUSCULAR | Status: AC | PRN
  Administered 2020-05-18: .3 mL

## 2020-05-18 NOTE — Progress Notes (Signed)
   Procedure Note  Patient: Courtney Kramer             Date of Birth: [birth year]           MRN: [id number]             Visit Date: 05/18/2020  Procedures: Visit Diagnoses:  1. Primary osteoarthritis of both hands     Small Joint Inj: bilateral long PIP on 05/18/2020 2:16 PM Indications: pain and joint swelling Details: 27 G needle, ultrasound-guided dorsal approach  Spinal Needle: No  Medications (Right): 0.3 mL lidocaine 1 %; 10 mg triamcinolone acetonide 40 MG/ML Medications (Left): 0.3 mL lidocaine 1 %; 10 mg triamcinolone acetonide 40 MG/ML Outcome: tolerated well, no immediate complications Procedure, treatment alternatives, risks and benefits explained, specific risks discussed. Consent was given by the patient. Immediately prior to procedure a time out was called to verify the correct patient, procedure, equipment, support staff and site/side marked as required. Patient was prepped and draped in the usual sterile fashion.    Post procedure instructions were given. Pollyann Savoy, MD

## 2020-06-03 ENCOUNTER — Other Ambulatory Visit: Payer: Self-pay | Admitting: Rheumatology

## 2020-06-03 DIAGNOSIS — M0579 Rheumatoid arthritis with rheumatoid factor of multiple sites without organ or systems involvement: Secondary | ICD-10-CM

## 2020-06-03 NOTE — Telephone Encounter (Signed)
Last Visit: 04/21/2020 Next Visit: 09/22/2020  Labs: 02/17/2020 CBC, BMP: calcium 8.6 Eye exam: 08/18/2019   Current Dose per office note on 04/21/2020: Plaquenil 200 mg 1 tablet BID DX: Rheumatoid arthritis involving multiple sites with positive rheumatoid factor  Okay to refill Plaquenil?

## 2020-07-14 ENCOUNTER — Other Ambulatory Visit: Payer: Self-pay | Admitting: Urology

## 2020-07-21 ENCOUNTER — Encounter (HOSPITAL_BASED_OUTPATIENT_CLINIC_OR_DEPARTMENT_OTHER): Payer: Self-pay | Admitting: Urology

## 2020-07-25 ENCOUNTER — Other Ambulatory Visit (HOSPITAL_COMMUNITY)
Admission: RE | Admit: 2020-07-25 | Discharge: 2020-07-25 | Disposition: A | Discharge status: Home / Self Care | Payer: Managed Care, Other (non HMO) | Source: Ambulatory Visit | Attending: Urology | Admitting: Urology

## 2020-07-25 DIAGNOSIS — Z20822 Contact with and (suspected) exposure to covid-19: Secondary | ICD-10-CM | POA: Diagnosis not present

## 2020-07-25 DIAGNOSIS — Z01812 Encounter for preprocedural laboratory examination: Secondary | ICD-10-CM | POA: Insufficient documentation

## 2020-07-25 LAB — SARS CORONAVIRUS 2 (TAT 6-24 HRS): SARS Coronavirus 2: NEGATIVE

## 2020-07-26 ENCOUNTER — Encounter (HOSPITAL_BASED_OUTPATIENT_CLINIC_OR_DEPARTMENT_OTHER): Payer: Self-pay | Admitting: Urology

## 2020-07-26 ENCOUNTER — Other Ambulatory Visit: Payer: Self-pay

## 2020-07-26 NOTE — Progress Notes (Signed)
Spoke w/ via phone for pre-op interview--- PT Lab needs dos---- no              Lab results------ no COVID test ------ 11-08-201 negative result in epic Arrive at ------- 0845 NPO after MN NO Solid Food.  Clear liquids from MN until--- 0745 Medications to take morning of surgery ----- Prilosec Diabetic medication ----- n/a Patient Special Instructions ----- n/a Pre-Op special Istructions ----- n/a Patient verbalized understanding of instructions that were given at this phone interview. Patient denies shortness of breath, chest pain, fever, cough at this phone interview.

## 2020-07-27 NOTE — H&P (Signed)
02/22/20: Courtney Kramer returns today with severe stent pain on the left. She has a 1cm left UPJ stone and is scheduled for URS on 6/17 but she is not tolerating the stent at all. She has no fever. UA just has blood.   03/10/20: Patient is s/p left URS on 6/7. Per review of operative note, the stone fragmented well and all sizable fragments were removed. She was left with tethered ureteral stent, which she removed at home without difficulty. Today she states that she is doing well. She denies any interval episodes of left flank or abdominal pain. However, she states she has been having some intermittent episodes of right lower back/flank pain. She denies any exacerbation of lower urinary tract symptoms or difficulties voiding. She denies gross hematuria or dysuria. No complaints of fever, chills, nausea, or vomiting.   06/01/20: Patient with above noted hx. She returns today for follow up with RUS. She states that she has been doing well since prior OV. She denies any current flank or abdominal pain. She states that she did have some left sided abdominal pain recently that last for about one week, but has now resolved. She thinks this may have been more GI related. She currently feels she is voiding at baseline. No complaints of dysuria, gross hematuria, fevers, chills, nausea, or vomiting. She did elect to move forward with metabolic evaluation. Serum labs were unremarkable. She has not completed 24 hour urinalysis.   07/13/20: 59 year old female who has a past medical history of nephrolithiasis requiring surgical intervention in the past. She found on call last night with complaints of sudden onset of right-sided flank discomfort radiating into her right lower abdomen. This was associated nausea. She denies any vomiting or fevers. She denies gross hematuria. She does have a 7 mm stone that was noted in her right kidney at last office visit. She feels like this is a stone event. She was able to take hydrocodone last  night for the pain, this allowed her to get a little bit of sleep. She would prefer to undergo a ureteroscopy instead of lithotripsy if this would be available.   07/20/20: Patient with above-noted history. She was noted to have an approximately 6-7 mm right UPJ calculus with associated hydronephrosis on CT imaging at prior office visit. She is scheduled for right ureteroscopy for definitive management on 11/11. She presents today with complaints of gross hematuria and persistent right flank pain. Currently she complains of mild right flank pain that radiates to her right lower quadrant. However, she has been having episodes of more severe pain. Her pain is currently fairly well managed with p.r.n. use of Vicodin. She states that last night she began experiencing some gross hematuria and increased urinary frequency. She denies any passage of clot material or difficulties voiding. No complaints of dysuria. She denies fevers or chills. She has had some nausea, but denies any episodes of emesis. Urine culture from prior office visit showed mixed growth, with predominance of diphtheroids. She states that she has been using oxybutynin, but only sporadically. She has not used any Ketorolac, but does have Rx.     ALLERGIES: Sulfa    MEDICATIONS: Ketorolac Tromethamine 10 mg tablet 1 tablet PO Q 6 H  Oxybutynin Chloride 5 mg tablet 1 tablet PO TID PRN  Tamsulosin Hcl 0.4 mg capsule 1 capsule PO Q HS  Cymbalta  Hydrocodone-Acetaminophen 5 mg-325 mg tablet 1 tablet PO Q 6 H PRN     GU PSH: Cystoscopy Insert  Stent, Left - 02/17/2020, 2015 ESWL - 2015, 2009 Ureteroscopic laser litho, Left - 02/22/2020       PSH Notes: Lithotripsy, Cystoscopy With Insertion Of Ureteral Stent Right, Knee Surgery, Lithotripsy   NON-GU PSH: Knee Arthroscopy     GU PMH: Ureteral calculus - 07/13/2020 Renal calculus - 06/01/2020, - 02/11/2020, - 2017, Nephrolithiasis, - 2015 Flank Pain - 02/18/2020 Chronic cystitis (w/o  hematuria), Chronic cystitis - 2015 Gross hematuria, Gross hematuria - 2015    NON-GU PMH: Encounter for general adult medical examination without abnormal findings, Encounter for preventive health examination - 2015 Personal history of other diseases of the musculoskeletal system and connective tissue, History of arthritis - 2015 Cardiac murmur, unspecified, Murmurs - 2014 Anxiety    FAMILY HISTORY: Death - Father Kidney Stones - Runs In Family   SOCIAL HISTORY: Marital Status: Married Preferred Language: English; Ethnicity: Not Hispanic Or Latino; Race: White Current Smoking Status: Patient has never smoked.   Tobacco Use Assessment Completed: Used Tobacco in last 30 days? Drinks 1 caffeinated drink per day. 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     Notes: Never a smoker, Caffeine Use, Alcohol Use, Marital History - Currently Married, Occupation:, Tobacco Use   REVIEW OF SYSTEMS:    GU Review Female:   Patient denies trouble starting your stream, burning /pain with urination, leakage of urine, hard to postpone urination, have to strain to urinate, frequent urination, stream starts and stops, being pregnant, and get up at night to urinate.  Gastrointestinal (Upper):   Patient denies nausea, vomiting, and indigestion/ heartburn.  Gastrointestinal (Lower):   Patient denies diarrhea and constipation.  Constitutional:   Patient denies fever, night sweats, weight loss, and fatigue.  Skin:   Patient denies skin rash/ lesion and itching.  Eyes:   Patient denies blurred vision and double vision.  Ears/ Nose/ Throat:   Patient denies sore throat and sinus problems.  Hematologic/Lymphatic:   Patient denies swollen glands and easy bruising.  Cardiovascular:   Patient denies leg swelling and chest pains.  Respiratory:   Patient denies cough and shortness of breath.  Endocrine:   Patient denies excessive thirst.  Musculoskeletal:   Patient denies back pain and joint pain.  Neurological:    Patient denies headaches and dizziness.  Psychologic:   Patient denies depression and anxiety.   Notes: Blood in urine    VITAL SIGNS:      07/20/2020 09:51 AM  Weight 192.7 lb / 87.41 kg  Height 65 in / 165.1 cm  BP 125/72 mmHg  Pulse 77 /min  Temperature 96.6 F / 35.8 C  BMI 32.1 kg/m   MULTI-SYSTEM PHYSICAL EXAMINATION:    Constitutional: Well-nourished. No physical deformities. Normally developed. Good grooming.  Respiratory: Normal breath sounds. No labored breathing, no use of accessory muscles.   Cardiovascular: Regular rate and rhythm. No murmur, no gallop. Normal temperature, normal extremity pulses, no swelling, no varicosities.   Neurologic / Psychiatric: Oriented to time, oriented to place, oriented to person. No depression, no anxiety, no agitation.  Gastrointestinal: No mass, no tenderness, no rigidity, non obese abdomen. Slight CVA tenderness.   Musculoskeletal: Normal gait and station of head and neck.     Complexity of Data:  Records Review:   Previous Patient Records  Urine Test Review:   Urinalysis, Urine Culture  Urodynamics Review:   Review Bladder Scan   PROCEDURES:          Urinalysis w/Scope - 81001 Dipstick Dipstick Cont'd Micro  Color: Amber Bilirubin:  Neg WBC/hpf: 6 - 10/hpf  Appearance: Cloudy Ketones: Neg RBC/hpf: >60/hpf  Specific Gravity: 1.025 Blood: 3+ Bacteria: Few (10-25/hpf)  pH: 6.0 Protein: 1+ Cystals: NS (Not Seen)  Glucose: Neg Urobilinogen: 0.2 Casts: NS (Not Seen)    Nitrites: Neg Trichomonas: Not Present    Leukocyte Esterase: 1+ Mucous: Present      Epithelial Cells: 0 - 5/hpf      Yeast: NS (Not Seen)      Sperm: Not Present    Notes:            Ketoralac 30mg  - , [id number] The injection site was sterilely prepped with alcohol. Ketoralac was injected (IM) using standard technique. The patient tolerated the procedure well.   Qty: 30 Adm. By: [id number]  Unit: mg Lot No Ina Homes  Route: IM Exp. Date 10/18/2021   Freq: None Mfgr.: 12/16/2021  Site: Right Hip   ASSESSMENT:      ICD-10 Details  1 GU:   Ureteral calculus - N20.1   2   Gross hematuria - R31.0   3   Flank Pain - R10.84    PLAN:           Orders Labs Urine Culture          Schedule Procedure: 07/20/2020 at [facility] - [phone] - Ketoralac 30mg  (Toradol Per 15 Mg) - [address], [phone]          Document Letter(s):  Created for Patient: Clinical Summary         Notes:   Urinalysis today is not overtly concerning for infectious process, but I will send urine culture as precaution giving upcoming procedure. I do not feel that repeat imaging is warranted today, as it will not change our plan moving forward. She was given injection of Ketoralac today in clinic for pain management. We reviewed pain management strategies today in detail and she voiced understanding of these. We discussed use of oxybutynin, as prescribed for increase in frequency. I did recommend increase hydration with noted increase in hematuria. We will plan to move forward with surgery as scheduled on 11/11. I recommended she continue to strain her urine and if she sees any stone material pass, to follow up in the interim. Strict return precautions to the clinic or emergency department were reviewed in the interim for fevers or refractory pain, nausea, or vomiting. She voiced understanding.        Next Appointment:      Next Appointment: 07/28/2020 10:45 AM    Appointment Type: Surgery     Location: [facility] [phone]    Provider: 13/07/2020, M.D.    Reason for Visit: OP NE CYSTO RT URS HLL STENT

## 2020-07-28 ENCOUNTER — Ambulatory Visit (HOSPITAL_BASED_OUTPATIENT_CLINIC_OR_DEPARTMENT_OTHER)
Admission: RE | Admit: 2020-07-28 | Discharge: 2020-07-28 | Disposition: A | Discharge status: Home / Self Care | Payer: Managed Care, Other (non HMO) | Attending: Urology | Admitting: Urology

## 2020-07-28 ENCOUNTER — Ambulatory Visit (HOSPITAL_BASED_OUTPATIENT_CLINIC_OR_DEPARTMENT_OTHER): Payer: Managed Care, Other (non HMO) | Admitting: Anesthesiology

## 2020-07-28 ENCOUNTER — Encounter (HOSPITAL_BASED_OUTPATIENT_CLINIC_OR_DEPARTMENT_OTHER): Payer: Self-pay | Admitting: Urology

## 2020-07-28 ENCOUNTER — Encounter (HOSPITAL_BASED_OUTPATIENT_CLINIC_OR_DEPARTMENT_OTHER)
Admission: RE | Disposition: A | Discharge status: Home / Self Care | Payer: Self-pay | Source: Home / Self Care | Attending: Urology

## 2020-07-28 DIAGNOSIS — N202 Calculus of kidney with calculus of ureter: Secondary | ICD-10-CM | POA: Diagnosis present

## 2020-07-28 DIAGNOSIS — Z87442 Personal history of urinary calculi: Secondary | ICD-10-CM | POA: Insufficient documentation

## 2020-07-28 DIAGNOSIS — Z882 Allergy status to sulfonamides status: Secondary | ICD-10-CM | POA: Diagnosis not present

## 2020-07-28 DIAGNOSIS — N132 Hydronephrosis with renal and ureteral calculous obstruction: Secondary | ICD-10-CM | POA: Insufficient documentation

## 2020-07-28 DIAGNOSIS — M069 Rheumatoid arthritis, unspecified: Secondary | ICD-10-CM | POA: Diagnosis not present

## 2020-07-28 DIAGNOSIS — Z79899 Other long term (current) drug therapy: Secondary | ICD-10-CM | POA: Insufficient documentation

## 2020-07-28 HISTORY — DX: Unspecified osteoarthritis, unspecified site: M19.90

## 2020-07-28 HISTORY — DX: Rheumatoid arthritis, unspecified: M06.9

## 2020-07-28 HISTORY — PX: CYSTOSCOPY/URETEROSCOPY/HOLMIUM LASER/STENT PLACEMENT: SHX6546

## 2020-07-28 HISTORY — DX: Personal history of other diseases of the circulatory system: Z86.79

## 2020-07-28 HISTORY — DX: Calculus of ureter: N20.1

## 2020-07-28 SURGERY — CYSTOSCOPY/URETEROSCOPY/HOLMIUM LASER/STENT PLACEMENT
Anesthesia: General | Laterality: Right

## 2020-07-28 MED ORDER — ACETAMINOPHEN 160 MG/5ML PO SOLN: 325.0000 mg | Freq: Once | ORAL | Status: DC | PRN

## 2020-07-28 MED ORDER — MIDAZOLAM HCL 2 MG/2ML IJ SOLN
INTRAMUSCULAR | Status: DC | PRN
  Administered 2020-07-28: 2 mg via INTRAVENOUS

## 2020-07-28 MED ORDER — SODIUM CHLORIDE 0.9% FLUSH: 3.0000 mL | INTRAVENOUS | Status: DC | PRN

## 2020-07-28 MED ORDER — PHENYLEPHRINE 40 MCG/ML (10ML) SYRINGE FOR IV PUSH (FOR BLOOD PRESSURE SUPPORT)
PREFILLED_SYRINGE | INTRAVENOUS | Status: DC | PRN
  Administered 2020-07-28: 80 ug via INTRAVENOUS

## 2020-07-28 MED ORDER — ACETAMINOPHEN 10 MG/ML IV SOLN: 1000.0000 mg | Freq: Once | INTRAVENOUS | Status: DC | PRN

## 2020-07-28 MED ORDER — DEXAMETHASONE SODIUM PHOSPHATE 10 MG/ML IJ SOLN
INTRAMUSCULAR | Status: DC | PRN
  Administered 2020-07-28: 10 mg via INTRAVENOUS

## 2020-07-28 MED ORDER — FENTANYL CITRATE (PF) 100 MCG/2ML IJ SOLN
INTRAMUSCULAR | Status: AC
  Filled 2020-07-28: qty 2

## 2020-07-28 MED ORDER — MEPERIDINE HCL 25 MG/ML IJ SOLN: 6.2500 mg | INTRAMUSCULAR | Status: DC | PRN

## 2020-07-28 MED ORDER — CEFAZOLIN SODIUM-DEXTROSE 2-4 GM/100ML-% IV SOLN
2.0000 g | INTRAVENOUS | Status: AC
  Administered 2020-07-28: 2 g via INTRAVENOUS

## 2020-07-28 MED ORDER — OXYCODONE HCL 5 MG/5ML PO SOLN: 5.0000 mg | Freq: Once | ORAL | Status: AC | PRN

## 2020-07-28 MED ORDER — OXYCODONE HCL 5 MG PO TABS: 5.0000 mg | ORAL_TABLET | ORAL | Status: DC | PRN

## 2020-07-28 MED ORDER — PHENYLEPHRINE 40 MCG/ML (10ML) SYRINGE FOR IV PUSH (FOR BLOOD PRESSURE SUPPORT)
PREFILLED_SYRINGE | INTRAVENOUS | Status: AC
  Filled 2020-07-28: qty 10

## 2020-07-28 MED ORDER — PROPOFOL 10 MG/ML IV BOLUS
INTRAVENOUS | Status: DC | PRN
  Administered 2020-07-28: 200 mg via INTRAVENOUS

## 2020-07-28 MED ORDER — LIDOCAINE 2% (20 MG/ML) 5 ML SYRINGE
INTRAMUSCULAR | Status: DC | PRN
  Administered 2020-07-28: 40 mg via INTRAVENOUS
  Administered 2020-07-28: 60 mg via INTRAVENOUS

## 2020-07-28 MED ORDER — CEFAZOLIN SODIUM-DEXTROSE 2-4 GM/100ML-% IV SOLN
INTRAVENOUS | Status: AC
  Filled 2020-07-28: qty 100

## 2020-07-28 MED ORDER — SODIUM CHLORIDE 0.9 % IV SOLN: 250.0000 mL | INTRAVENOUS | Status: DC | PRN

## 2020-07-28 MED ORDER — ONDANSETRON HCL 4 MG/2ML IJ SOLN
INTRAMUSCULAR | Status: AC
  Filled 2020-07-28: qty 2

## 2020-07-28 MED ORDER — LACTATED RINGERS IV SOLN: INTRAVENOUS | Status: DC

## 2020-07-28 MED ORDER — ACETAMINOPHEN 325 MG PO TABS: 650.0000 mg | ORAL_TABLET | ORAL | Status: DC | PRN

## 2020-07-28 MED ORDER — OXYCODONE HCL 5 MG PO TABS
ORAL_TABLET | ORAL | Status: AC
  Filled 2020-07-28: qty 1

## 2020-07-28 MED ORDER — ACETAMINOPHEN 325 MG PO TABS: 325.0000 mg | ORAL_TABLET | Freq: Once | ORAL | Status: DC | PRN

## 2020-07-28 MED ORDER — EPHEDRINE SULFATE-NACL 50-0.9 MG/10ML-% IV SOSY
PREFILLED_SYRINGE | INTRAVENOUS | Status: DC | PRN
  Administered 2020-07-28: 15 mg via INTRAVENOUS
  Administered 2020-07-28 (×2): 10 mg via INTRAVENOUS
  Administered 2020-07-28: 15 mg via INTRAVENOUS

## 2020-07-28 MED ORDER — FENTANYL CITRATE (PF) 100 MCG/2ML IJ SOLN
INTRAMUSCULAR | Status: DC | PRN
  Administered 2020-07-28: 25 ug via INTRAVENOUS
  Administered 2020-07-28: 50 ug via INTRAVENOUS
  Administered 2020-07-28: 25 ug via INTRAVENOUS

## 2020-07-28 MED ORDER — MIDAZOLAM HCL 2 MG/2ML IJ SOLN
INTRAMUSCULAR | Status: AC
  Filled 2020-07-28: qty 2

## 2020-07-28 MED ORDER — FENTANYL CITRATE (PF) 100 MCG/2ML IJ SOLN: 25.0000 ug | INTRAMUSCULAR | Status: DC | PRN

## 2020-07-28 MED ORDER — DEXAMETHASONE SODIUM PHOSPHATE 10 MG/ML IJ SOLN
INTRAMUSCULAR | Status: AC
  Filled 2020-07-28: qty 1

## 2020-07-28 MED ORDER — MORPHINE SULFATE (PF) 4 MG/ML IV SOLN: 2.0000 mg | INTRAVENOUS | Status: DC | PRN

## 2020-07-28 MED ORDER — EPHEDRINE 5 MG/ML INJ
INTRAVENOUS | Status: AC
  Filled 2020-07-28: qty 10

## 2020-07-28 MED ORDER — ACETAMINOPHEN 325 MG RE SUPP: 650.0000 mg | RECTAL | Status: DC | PRN

## 2020-07-28 MED ORDER — IOHEXOL 300 MG/ML  SOLN
INTRAMUSCULAR | Status: DC | PRN
  Administered 2020-07-28: 10 mL

## 2020-07-28 MED ORDER — OXYCODONE HCL 5 MG PO TABS
5.0000 mg | ORAL_TABLET | Freq: Once | ORAL | Status: AC | PRN
  Administered 2020-07-28: 5 mg via ORAL

## 2020-07-28 MED ORDER — ONDANSETRON HCL 4 MG/2ML IJ SOLN
INTRAMUSCULAR | Status: DC | PRN
  Administered 2020-07-28: 4 mg via INTRAVENOUS

## 2020-07-28 MED ORDER — LIDOCAINE 2% (20 MG/ML) 5 ML SYRINGE
INTRAMUSCULAR | Status: AC
  Filled 2020-07-28: qty 5

## 2020-07-28 MED ORDER — 0.9 % SODIUM CHLORIDE (POUR BTL) OPTIME
TOPICAL | Status: DC | PRN
  Administered 2020-07-28: 500 mL

## 2020-07-28 MED ORDER — SODIUM CHLORIDE 0.9 % IR SOLN
Status: DC | PRN
  Administered 2020-07-28: 3000 mL

## 2020-07-28 MED ORDER — OXYBUTYNIN CHLORIDE 5 MG PO TABS: 5.0000 mg | ORAL_TABLET | Freq: Three times a day (TID) | ORAL | 1 refills | Status: DC | PRN

## 2020-07-28 MED ORDER — SODIUM CHLORIDE 0.9% FLUSH: 3.0000 mL | Freq: Two times a day (BID) | INTRAVENOUS | Status: DC

## 2020-07-28 MED ORDER — PROPOFOL 10 MG/ML IV BOLUS
INTRAVENOUS | Status: AC
  Filled 2020-07-28: qty 20

## 2020-07-28 MED ORDER — AMISULPRIDE (ANTIEMETIC) 5 MG/2ML IV SOLN: 10.0000 mg | Freq: Once | INTRAVENOUS | Status: DC | PRN

## 2020-07-28 SURGICAL SUPPLY — 29 items
BAG DRAIN URO-CYSTO SKYTR STRL (DRAIN) ×3 IMPLANT
BAG DRN UROCATH (DRAIN) ×1
BASKET STONE 1.7 NGAGE (UROLOGICAL SUPPLIES) ×2 IMPLANT
BASKET ZERO TIP NITINOL 2.4FR (BASKET) IMPLANT
BSKT STON RTRVL ZERO TP 2.4FR (BASKET)
CATH URET 5FR 28IN CONE TIP (BALLOONS)
CATH URET 5FR 28IN OPEN ENDED (CATHETERS) IMPLANT
CATH URET 5FR 70CM CONE TIP (BALLOONS) IMPLANT
CLOTH BEACON ORANGE TIMEOUT ST (SAFETY) ×3 IMPLANT
ELECT REM PT RETURN 9FT ADLT (ELECTROSURGICAL)
ELECTRODE REM PT RTRN 9FT ADLT (ELECTROSURGICAL) IMPLANT
FIBER LASER FLEXIVA 365 (UROLOGICAL SUPPLIES) IMPLANT
FIBER LASER TRAC TIP (UROLOGICAL SUPPLIES) IMPLANT
GLOVE SURG SS PI 8.0 STRL IVOR (GLOVE) ×3 IMPLANT
GOWN STRL REUS W/TWL XL LVL3 (GOWN DISPOSABLE) ×3 IMPLANT
GUIDEWIRE ANG ZIPWIRE 038X150 (WIRE) IMPLANT
GUIDEWIRE STR DUAL SENSOR (WIRE) ×3 IMPLANT
IV NS IRRIG 3000ML ARTHROMATIC (IV SOLUTION) ×3 IMPLANT
KIT TURNOVER CYSTO (KITS) ×3 IMPLANT
MANIFOLD NEPTUNE II (INSTRUMENTS) ×3 IMPLANT
NS IRRIG 500ML POUR BTL (IV SOLUTION) ×3 IMPLANT
PACK CYSTO (CUSTOM PROCEDURE TRAY) ×3 IMPLANT
SHEATH URETERAL 12FRX35CM (MISCELLANEOUS) ×2 IMPLANT
STENT URET 6FRX24 CONTOUR (STENTS) ×2 IMPLANT
TRACTIP FLEXIVA PULS ID 200XHI (Laser) IMPLANT
TRACTIP FLEXIVA PULSE ID 200 (Laser) ×3
TUBE CONNECTING 12'X1/4 (SUCTIONS) ×1
TUBE CONNECTING 12X1/4 (SUCTIONS) ×2 IMPLANT
TUBING UROLOGY SET (TUBING) IMPLANT

## 2020-07-28 NOTE — Op Note (Signed)
Procedure: 1.  Cystoscopy with right retrograde pyelogram and interpretation. 2.  Right ureteroscopic stone extraction with holmium laser application for right distal ureteral stone. 3.  Right ureteroscopic stone extraction with holmium laser application for right mid renal stone with insertion of right double-J stent. 4.  Application of fluoroscopy.  Preop diagnosis: Right proximal ureteral stone.  Postop diagnosis: Right distal ureteral stone and right renal stone.  Surgeon: Dr. [name].  Anesthesia: General.  Specimen: Stone fragments.  Drains: 6 French by 24 cm right contour double-J stent.  EBL: None.  Complications: None.  Indications: The patient is a 59 year old female with a history of recurrent urolithiasis who presented in late October with right flank pain and was found to have a 75mm right proximal ureteral stone with obstruction.  She failed medical therapy and has elected ureteroscopy.    Procedure: She was taken to the OR and given ancef 2 gms and she had been on preop po antibiotics.  A general anesthetic was induced.  She was placed in the lithotomy position and PAS hose were placed.  She was prepped with betadine  and draped in the usual sterile fashion.  Initial fluoroscopy showed a calcification in the right pelvis that was felt to be consistent with her stone.  Cystoscopy was performed with the 57fr scope and 30 deg lens. The urethra was normal.  The bladder wall was smooth and pale without lesions.  The UO's were in the normal anatomic position.  A right retrograde pyelogram was done with a 70fr open end catheter and omnipaque.  The right retrograde pyelogram demonstrated a filling defect in the right distal ureter consistent with the stone.  There is mild proximal dilation without other filling defects.  A sensor wire was passed to the right kidney and the inner core of a 12/14 access sheath was advanced by the stone.  The 6.6fr semirigid scope was passed.   The stone was too large to remove intact.  The 200 micron laser fiber was passed and the stone was fragment with a max energy of 0.8J and 20 Hx.  The fragments were removed with an N-gage basket.  The fragments were removed from the bladder.  The single lumen digital scope was passed along side the wire to the kidney and a 79mm stone was found adherent to a middle calyceal papilla.   The stone was fragmented with the laser and the larger fragments removed with the n-gage.    Final inspection demonstrated only residual grit.   The ureteroscope was removed.    The cystoscopy was inserted over the wire and a 6 x 24cm Contour JJ stent was passed under fluoroscopic guidance.  The wire was removed, leaving a good coil in the kidney and bladder.  The bladder was drained.  The scope was removed.  She was taken down from lithotomy position, her anesthetic was reversed and she was moved to the PACU in stable condition.

## 2020-07-28 NOTE — Interval H&P Note (Signed)
History and Physical Interval Note:   CT films reviewed.   She is still symptomatic.    07/28/2020 10:03 AM  Courtney Kramer  has presented today for surgery, with the diagnosis of RIGHT URETERAL STONE.  The various methods of treatment have been discussed with the patient and family. After consideration of risks, benefits and other options for treatment, the patient has consented to  Procedure(s): CYSTOSCOPY RIGHT URETEROSCOPY RIGHT RETROGRADE PYELOGRAM HOLMIUM LASER/STENT PLACEMENT (Right) as a surgical intervention.  The patient's history has been reviewed, patient examined, no change in status, stable for surgery.  I have reviewed the patient's chart and labs.  Questions were answered to the patient's satisfaction.     [name]

## 2020-07-28 NOTE — Discharge Instructions (Addendum)
Ureteral Stent Implantation, Care After This sheet gives you information about how to care for yourself after your procedure. Your health care provider may also give you more specific instructions. If you have problems or questions, contact your health care provider. What can I expect after the procedure? After the procedure, it is common to have:  Nausea.  Mild pain when you urinate. You may feel this pain in your lower back or lower abdomen. The pain should stop within a few minutes after you urinate. This may last for up to 1 week.  A small amount of blood in your urine for several days. Follow these instructions at home: Medicines  Take over-the-counter and prescription medicines only as told by your health care provider.  If you were prescribed an antibiotic medicine, take it as told by your health care provider. Do not stop taking the antibiotic even if you start to feel better.  Do not drive for 24 hours if you were given a sedative during your procedure.  Ask your health care provider if the medicine prescribed to you requires you to avoid driving or using heavy machinery. Activity  Rest as told by your health care provider.  Avoid sitting for a long time without moving. Get up to take short walks every 1-2 hours. This is important to improve blood flow and breathing. Ask for help if you feel weak or unsteady.  Return to your normal activities as told by your health care provider. Ask your health care provider what activities are safe for you. General instructions   Watch for any blood in your urine. Call your health care provider if the amount of blood in your urine increases.  If you have a catheter: ? Follow instructions from your health care provider about taking care of your catheter and collection bag. ? Do not take baths, swim, or use a hot tub until your health care provider approves. Ask your health care provider if you may take showers. You may only be allowed to  take sponge baths.  Drink enough fluid to keep your urine pale yellow.  Do not use any products that contain nicotine or tobacco, such as cigarettes, e-cigarettes, and chewing tobacco. These can delay healing after surgery. If you need help quitting, ask your health care provider.  Keep all follow-up visits as told by your health care provider. This is important. Contact a health care provider if:  You have pain that gets worse or does not get better with medicine, especially pain when you urinate.  You have difficulty urinating.  You feel nauseous or you vomit repeatedly during a period of more than 2 days after the procedure. Get help right away if:  Your urine is dark red or has blood clots in it.  You are leaking urine (have incontinence).  The end of the stent comes out of your urethra.  You cannot urinate.  You have sudden, sharp, or severe pain in your abdomen or lower back.  You have a fever.  You have swelling or pain in your legs.  You have difficulty breathing. Summary  After the procedure, it is common to have mild pain when you urinate that goes away within a few minutes after you urinate. This may last for up to 1 week.  Watch for any blood in your urine. Call your health care provider if the amount of blood in your urine increases.  Take over-the-counter and prescription medicines only as told by your health care provider.  Drink   enough fluid to keep your urine pale yellow. This information is not intended to replace advice given to you by your health care provider. Make sure you discuss any questions you have with your health care provider. Document Revised: 06/10/2018 Document Reviewed: 06/11/2018 Elsevier Patient Education  2020 [employer]  Post Anesthesia Home Care Instructions  Activity: Get plenty of rest for the remainder of the day. A responsible adult should stay with you for 24 hours following the procedure.  For the next 24 hours, DO  NOT: -Drive a car -Operate machinery -Drink alcoholic beverages -Take any medication unless instructed by your physician -Make any legal decisions or sign important papers.  Meals: Start with liquid foods such as gelatin or soup. Progress to regular foods as tolerated. Avoid greasy, spicy, heavy foods. If nausea and/or vomiting occur, drink only clear liquids until the nausea and/or vomiting subsides. Call your physician if vomiting continues.  Special Instructions/Symptoms: Your throat may feel dry or sore from the anesthesia or the breathing tube placed in your throat during surgery. If this causes discomfort, gargle with warm salt water. The discomfort should disappear within 24 hours.  If you had a scopolamine patch placed behind your ear for the management of post- operative nausea and/or vomiting:  1. The medication in the patch is effective for 72 hours, after which it should be removed.  Wrap patch in a tissue and discard in the trash. Wash hands thoroughly with soap and water. 2. You may remove the patch earlier than 72 hours if you experience unpleasant side effects which may include dry mouth, dizziness or visual disturbances. 3. Avoid touching the patch. Wash your hands with soap and water after contact with the patch.    

## 2020-07-28 NOTE — Anesthesia Preprocedure Evaluation (Addendum)
Anesthesia Evaluation  Patient identified by MRN, date of birth, ID band Patient awake    Reviewed: Allergy & Precautions, NPO status , Patient's Chart, lab work & pertinent test results  Airway Mallampati: I  TM Distance: >3 FB Neck ROM: Full    Dental  (+) Teeth Intact, Dental Advisory Given   Pulmonary    breath sounds clear to auscultation       Cardiovascular  Rhythm:Regular Rate:Normal     Neuro/Psych PSYCHIATRIC DISORDERS Anxiety    GI/Hepatic Neg liver ROS, GERD  ,  Endo/Other  negative endocrine ROS  Renal/GU Renal disease     Musculoskeletal  (+) Arthritis , Rheumatoid disorders,    Abdominal Normal abdominal exam  (+)   Peds  Hematology negative hematology ROS (+)   Anesthesia Other Findings   Reproductive/Obstetrics                            Anesthesia Physical Anesthesia Plan  ASA: II  Anesthesia Plan: General   Post-op Pain Management:    Induction: Intravenous  PONV Risk Score and Plan: 4 or greater and Ondansetron, Dexamethasone, Midazolam and Scopolamine patch - Pre-op  Airway Management Planned: LMA  Additional Equipment: None  Intra-op Plan:   Post-operative Plan: Extubation in OR  Informed Consent: I have reviewed the patients History and Physical, chart, labs and discussed the procedure including the risks, benefits and alternatives for the proposed anesthesia with the patient or authorized representative who has indicated his/her understanding and acceptance.     Dental advisory given  Plan Discussed with: CRNA  Anesthesia Plan Comments:        Anesthesia Quick Evaluation

## 2020-07-28 NOTE — Transfer of Care (Signed)
Immediate Anesthesia Transfer of Care Note  Patient: Courtney Kramer  Procedure(s) Performed: Procedure(s) (LRB): CYSTOSCOPY RIGHT URETEROSCOPY RIGHT RETROGRADE PYELOGRAM HOLMIUM LASER/STENT PLACEMENT (Right)  Patient Location: PACU  Anesthesia Type: General  Level of Consciousness: awake, alert  and oriented  Airway & Oxygen Therapy: Patient Spontanous Breathing and Patient connected to nasal cannula oxygen  Post-op Assessment: Report given to PACU RN and Post -op Vital signs reviewed and stable  Post vital signs: Reviewed and stable  Complications: No apparent anesthesia complicationsLast Vitals:  Vitals Value Taken Time  BP 136/72 07/28/20 1132  Temp 36.4 C 07/28/20 1132  Pulse 84 07/28/20 1134  Resp 15 07/28/20 1134  SpO2 100 % 07/28/20 1134  Vitals shown include unvalidated device data.  Last Pain:  Vitals:   07/28/20 0911  TempSrc: Oral  PainSc: 4       Patients Stated Pain Goal: 4 (07/28/20 0911)  Complications: No complications documented.

## 2020-07-28 NOTE — Anesthesia Procedure Notes (Signed)
Procedure Name: LMA Insertion Date/Time: 07/28/2020 10:36 AM Performed by: Norva Pavlov, CRNA Pre-anesthesia Checklist: Patient identified, Emergency Drugs available, Suction available and Patient being monitored Patient Re-evaluated:Patient Re-evaluated prior to induction Oxygen Delivery Method: Circle system utilized Preoxygenation: Pre-oxygenation with 100% oxygen Induction Type: IV induction Ventilation: Mask ventilation without difficulty LMA: LMA inserted LMA Size: 4.0 Number of attempts: 1 Airway Equipment and Method: Bite block Placement Confirmation: positive ETCO2 Tube secured with: Tape Dental Injury: Teeth and Oropharynx as per pre-operative assessment

## 2020-07-29 ENCOUNTER — Encounter (HOSPITAL_BASED_OUTPATIENT_CLINIC_OR_DEPARTMENT_OTHER): Payer: Self-pay | Admitting: Urology

## 2020-07-29 NOTE — Anesthesia Postprocedure Evaluation (Signed)
Anesthesia Post Note  Patient: Burnis Kaser Beadles  Procedure(s) Performed: CYSTOSCOPY RIGHT URETEROSCOPY RIGHT RETROGRADE PYELOGRAM HOLMIUM LASER/STENT PLACEMENT (Right )     Patient location during evaluation: PACU Anesthesia Type: General Level of consciousness: awake and alert Pain management: pain level controlled Vital Signs Assessment: post-procedure vital signs reviewed and stable Respiratory status: spontaneous breathing, nonlabored ventilation, respiratory function stable and patient connected to nasal cannula oxygen Cardiovascular status: blood pressure returned to baseline and stable Postop Assessment: no apparent nausea or vomiting Anesthetic complications: no   No complications documented.  Last Vitals:  Vitals:   07/28/20 1200 07/28/20 1215  BP: 140/67 (!) 145/69  Pulse: 82 80  Resp: (!) 21 20  Temp:  36.7 C  SpO2: 92% 96%    Last Pain:  Vitals:   07/28/20 1230  TempSrc:   PainSc: 3                  Shelton Silvas

## 2020-09-08 NOTE — Progress Notes (Signed)
Office Visit Note  Patient: Courtney Kramer             Date of Birth: 1961-02-25           MRN: [id number]             PCP: Dema Severin, NP Referring: Dema Severin, NP Visit Date: 09/22/2020 Occupation: @  Subjective:  Left knee pain  History of Present Illness: Courtney Kramer is a 59 y.o. female with history of seropositive rheumatoid arthritis and osteoarthritis.  She is taking plaquenil 200 mg 1 tablet by mouth twice daily.  She is tolerating Plaquenil without any side effects.  She denies any recent rheumatoid arthritis flares.  She reports that she continues to have persistent pain and tenderness in the bilateral third PIP joints.  She states that after the cortisone injection performed on 05/18/2020 she had about 4 weeks of pain relief third digits.  She states that her discomfort has returned.  She is having some ongoing discomfort in the left knee joint as well.  She denies any warmth or swelling in her left knee at this time.  She denies any other joint pain or joint swelling at this time.  She does not want to make any medication changes at this time.  She continues to take Celebrex 200 mg 1 capsule by mouth daily for pain relief.    Activities of Daily Living:  Patient reports morning stiffness for 1 hour.   Patient Denies nocturnal pain.  Difficulty dressing/grooming: Denies Difficulty climbing stairs: Reports Difficulty getting out of chair: Denies Difficulty using hands for taps, buttons, cutlery, and/or writing: Denies  Review of Systems  Constitutional: Positive for fatigue.  HENT: Negative for mouth dryness.   Eyes: Negative for dryness.  Respiratory: Negative for shortness of breath.   Cardiovascular: Negative for swelling in legs/feet.  Gastrointestinal: Negative for diarrhea.  Endocrine: Positive for heat intolerance.  Genitourinary: Negative for painful urination.  Musculoskeletal: Positive for arthralgias, joint pain, joint swelling, morning  stiffness and muscle tenderness.  Skin: Negative for color change and rash.  Allergic/Immunologic: Negative for susceptible to infections.  Neurological: Positive for numbness.  Hematological: Negative for bruising/bleeding tendency.  Psychiatric/Behavioral: Negative for sleep disturbance.    PMFS History:  Patient Active Problem List   Diagnosis Date Noted  . Rheumatoid arthritis involving multiple sites with positive rheumatoid factor (HCC) 04/21/2020  . High risk medication use 04/21/2020  . Primary osteoarthritis of both hands 04/21/2020  . Primary osteoarthritis of both knees 04/21/2020  . Primary osteoarthritis of both feet 04/21/2020  . DDD (degenerative disc disease), lumbar 04/21/2020  . Fracture, Colles, right, closed 04/09/2018  . Closed fracture of right distal radius and ulna, initial encounter 03/27/2018  . Lumbar radiculopathy 09/19/2016  . RENAL CALCULUS 05/04/2008  . FLANK PAIN, RIGHT 05/04/2008    Past Medical History:  Diagnosis Date  . Anxiety   . Chronic back pain   . Chronic back pain   . GERD (gastroesophageal reflux disease)   . History of cardiac murmur    07-26-2020  per pt happen after last pregnancy, work-up done was ok, and was told few years pcp no longer hears it  . History of kidney stones   . Kidney stones   . OA (osteoarthritis)    hands , knees, feet  . RA (rheumatoid arthritis) [facility])    rheumotology--- Dr Kathie Rhodes. Deveshwar--- multiple sites seropositive/ positive rheumotoid factor,  taking plaquinel  . Right ureteral calculus   .  Wears glasses     Family History  Problem Relation Age of Onset  . Fibromyalgia Mother   . Heart disease Mother   . Macular degeneration Mother   . Rheum arthritis Father   . Rheum arthritis Sister   . Cancer Brother   . Breast cancer Neg Hx    Past Surgical History:  Procedure Laterality Date  . COLONOSCOPY  2012  . CYSTO/  RIGHT URETERAL STENT PLACEMENT  05-10-2008  . CYSTOSCOPY W/ URETERAL STENT  PLACEMENT Right 06/09/2014   Procedure: CYSTOSCOPY WITH RETROGRADE PYELOGRAM/URETERAL STENT PLACEMENT;  Surgeon: Valetta Fuller, MD;  Location: [facility];  Service: Urology;  Laterality: Right;  . CYSTOSCOPY W/ URETERAL STENT PLACEMENT Left 02/17/2020   Procedure: CYSTOSCOPY WITH RETROGRADE PYELOGRAM/URETERAL STENT PLACEMENT;  Surgeon: Sebastian Ache, MD;  Location: [facility];  Service: Urology;  Laterality: Left;  45 MINS  . CYSTOSCOPY/URETEROSCOPY/HOLMIUM LASER/STENT PLACEMENT Left 02/22/2020   Procedure: CYSTOSCOPY/LEFT URETEROSCOPY/HOLMIUM LASER/STENT EXCHANGE;  Surgeon: [name], MD;  Location: [facility];  Service: Urology;  Laterality: Left;  . CYSTOSCOPY/URETEROSCOPY/HOLMIUM LASER/STENT PLACEMENT Right 07/28/2020   Procedure: CYSTOSCOPY RIGHT URETEROSCOPY RIGHT RETROGRADE PYELOGRAM HOLMIUM LASER/STENT PLACEMENT;  Surgeon: [name], MD;  Location: [facility];  Service: Urology;  Laterality: Right;  . EXTRACORPOREAL SHOCK WAVE LITHOTRIPSY Right 05-10-2008  . KNEE ARTHROSCOPY Left 2009  . OPEN REDUCTION INTERNAL FIXATION (ORIF) DISTAL RADIAL FRACTURE Right 03/27/2018   Procedure: OPEN REDUCTION INTERNAL FIXATION (ORIF) RIGHT DISTAL RADIUS FRACTURE;  Surgeon: Tarry Kos, MD;  Location: [facility];  Service: Orthopedics;  Laterality: Right;  . WISDOM TOOTH EXTRACTION  AGE 60   Social History   Social History Narrative  . Not on file    There is no immunization history on file for this patient.   Objective: Vital Signs: BP 131/74 (BP Location: Left Arm, Patient Position: Sitting, Cuff Size: Normal)   Pulse 79   Resp 16   Ht 5' (1.524 m)   Wt 192 lb (87.1 kg)   BMI 37.50 kg/m    Physical Exam Vitals and nursing note reviewed.  Constitutional:      Appearance: She is well-developed and well-nourished.  HENT:     Head: Normocephalic and atraumatic.  Eyes:     Extraocular Movements: EOM normal.     Conjunctiva/sclera: Conjunctivae  normal.  Cardiovascular:     Pulses: Intact distal pulses.  Pulmonary:     Effort: Pulmonary effort is normal.  Abdominal:     Palpations: Abdomen is soft.  Musculoskeletal:     Cervical back: Normal range of motion.  Skin:    General: Skin is warm and dry.     Capillary Refill: Capillary refill takes less than 2 seconds.  Neurological:     Mental Status: She is alert and oriented to person, place, and time.  Psychiatric:        Mood and Affect: Mood and affect normal.        Behavior: Behavior normal.      Musculoskeletal Exam: C-spine, thoracic spine, and lumbar spine have good range of motion with no discomfort.  Shoulder joints, elbow joints, and wrist joints have good range of motion with no discomfort.  She has tenderness and synovial thickening of bilateral third PIP joints. Synovitis of the left 3rd PIP noted. She is able to make a complete fist bilaterally.  Hip joints have good range of motion with no discomfort.  No tenderness over trochanteric bursa bilaterally.  Knee joints have  good range of motion with no warmth or effusion.  She has some discomfort in the left knee with flexion.  Ankle joints have good range of motion with no tenderness or inflammation.  CDAI Exam: CDAI Score: 4.6  Patient Global: 3 mm; Provider Global: 3 mm Swollen: 1 ; Tender: 3  Joint Exam 09/22/2020      Right  Left  PIP 3   Tender  Swollen Tender  Knee      Tender     Investigation: No additional findings.  Imaging: No results found.  Recent Labs: Lab Results  Component Value Date   WBC 6.2 02/17/2020   HGB 13.8 02/17/2020   PLT 286 02/17/2020   NA 136 02/17/2020   K 3.8 02/17/2020   CL 102 02/17/2020   CO2 26 02/17/2020   GLUCOSE 89 02/17/2020   BUN 17 02/17/2020   CREATININE 0.77 02/17/2020   BILITOT 0.5 11/26/2019   AST 22 11/26/2019   ALT 15 11/26/2019   PROT 6.7 11/26/2019   CALCIUM 8.6 (L) 02/17/2020   GFRAA >60 02/17/2020   QFTBGOLDPLUS NEGATIVE 07/22/2019     Speciality Comments: PLQ eye exam: 08/18/2019 WNL. Dr. Loraine Leriche Mitchum. Follow up in 1 year.  Procedures:  No procedures performed Allergies: Sulfa antibiotics   Assessment / Plan:     Visit Diagnoses: Rheumatoid arthritis involving multiple sites with positive rheumatoid factor (HCC) - Positive rheumatoid factor, negative CCP: She has tenderness and synovial thickening of bilateral third PIP joints. Synovitis of the left 3rd PIP joint noted.  She underwent ultrasound-guided cortisone injections in bilateral third PIP joints on 05/18/2020 which provided significant pain relief for about 4 weeks.  Her discomfort and tenderness have returned.  She is also been experiencing intermittent discomfort in the left knee joint but has no warmth or effusion on examination today.  She is currently taking Plaquenil 200 mg 1 tablet by mouth twice daily.  She is tolerating Plaquenil without any side effects and has not missed any doses recently.  We discussed adding on low-dose Arava 10 mg 1 tablet by mouth daily as combination therapy but she declined at this time.  She was given a handout of information about arrival to review which we can further discuss at her follow-up visit if she changes her mind.  She is apprehensive to add any medications to her current regimen.  She would like to continue on Plaquenil as prescribed.  She does not need a refill at this time.  She was advised to notify us if she develops increased joint pain or joint swelling.  She will follow-up in the office in 5 months.  High risk medication use - Plaquenil 200 mg 1 tablet BID (started mid-November 2020). PLQ eye exam: 08/18/2019.  She was given a Plaquenil eye exam form to take with her to her upcoming appointment.  CBC and BMP updated on 02/17/20.  She is overdue to update lab work.  Orders for CBC and CMP released today.  She is due to update PLQ eye exam.  PLQ eye exam form was provided to the patient for her to take with her to her upcoming  appointment.   - Plan: CBC with Differential/Platelet, COMPLETE METABOLIC PANEL WITH GFR She has not had any recent infections.  Primary osteoarthritis of both hands: She has PIP and DIP thickening consistent with osteoarthritis of both hands.  Tenderness to palpation of her bilateral third PIP joints noted.  She is able to make a complete fist bilaterally.  Both wrist joints have good range of motion with no tenderness or inflammation.  She had bilateral third PIP joint cortisone injections performed on 05/18/2020 which provided pain relief for 4 weeks.  Her discomfort has returned but has been tolerable.  Joint protection and muscle strengthening were discussed.  A handout of hand exercises was provided to the patient.  Primary osteoarthritis of both knees - Bilateral moderate osteoarthritis with mild chondromalacia patella: She has good range of motion of both knee joints on examination today.  No warmth or effusion was noted.  She has been experiencing intermittent discomfort in the left knee.  She declined a left knee joint cortisone injection today.  She was given a handout of knee joint exercises to perform.  She was advised to notify us if she develops increased discomfort or swelling in the left knee.  Primary osteoarthritis of both feet: She is not experiencing any discomfort in her feet at this time.  She has good range of motion of both ankle joints with no tenderness or inflammation.  DDD (degenerative disc disease), lumbar: She is not experiencing any discomfort in her lower back currently.  Other medical conditions are listed as follows:  Family history of rheumatoid arthritis - Father and 2 of her sisters.  Closed Colles' fracture of right radius with routine healing, subsequent encounter  RENAL CALCULUS  Orders: Orders Placed This Encounter  Procedures  . CBC with Differential/Platelet  . COMPLETE METABOLIC PANEL WITH GFR   No orders of the defined types were placed in this  encounter.    Follow-Up Instructions: Return in about 5 months (around 02/20/2021) for Rheumatoid arthritis, Osteoarthritis.   Gearldine Bienenstock, PA-C  Note - This record has been created using Dragon software.  Chart creation errors have been sought, but may not always  have been located. Such creation errors do not reflect on  the standard of medical care.

## 2020-09-22 ENCOUNTER — Encounter: Payer: Self-pay | Admitting: Physician Assistant

## 2020-09-22 ENCOUNTER — Other Ambulatory Visit: Payer: Self-pay

## 2020-09-22 ENCOUNTER — Ambulatory Visit (INDEPENDENT_AMBULATORY_CARE_PROVIDER_SITE_OTHER): Payer: Managed Care, Other (non HMO) | Admitting: Physician Assistant

## 2020-09-22 VITALS — BP 131/74 | HR 79 | Resp 16 | Ht 60.0 in | Wt 192.0 lb

## 2020-09-22 DIAGNOSIS — M19041 Primary osteoarthritis, right hand: Secondary | ICD-10-CM

## 2020-09-22 DIAGNOSIS — Z79899 Other long term (current) drug therapy: Secondary | ICD-10-CM | POA: Diagnosis not present

## 2020-09-22 DIAGNOSIS — Z8261 Family history of arthritis: Secondary | ICD-10-CM

## 2020-09-22 DIAGNOSIS — M19042 Primary osteoarthritis, left hand: Secondary | ICD-10-CM

## 2020-09-22 DIAGNOSIS — M5136 Other intervertebral disc degeneration, lumbar region: Secondary | ICD-10-CM

## 2020-09-22 DIAGNOSIS — M0579 Rheumatoid arthritis with rheumatoid factor of multiple sites without organ or systems involvement: Secondary | ICD-10-CM | POA: Diagnosis not present

## 2020-09-22 DIAGNOSIS — N200 Calculus of kidney: Secondary | ICD-10-CM

## 2020-09-22 DIAGNOSIS — M19072 Primary osteoarthritis, left ankle and foot: Secondary | ICD-10-CM

## 2020-09-22 DIAGNOSIS — M170 Bilateral primary osteoarthritis of knee: Secondary | ICD-10-CM | POA: Diagnosis not present

## 2020-09-22 DIAGNOSIS — M19071 Primary osteoarthritis, right ankle and foot: Secondary | ICD-10-CM

## 2020-09-22 DIAGNOSIS — S52531D Colles' fracture of right radius, subsequent encounter for closed fracture with routine healing: Secondary | ICD-10-CM

## 2020-09-22 NOTE — Patient Instructions (Addendum)
Leflunomide tablets What is this medicine? LEFLUNOMIDE (le FLOO na mide) is for rheumatoid arthritis. This medicine may be used for other purposes; ask your health care provider or pharmacist if you have questions. COMMON BRAND NAME(S): Arava What should I tell my health care provider before I take this medicine? They need to know if you have any of these conditions:  diabetes  have a fever or infection  high blood pressure  immune system problems  kidney disease  liver disease  low blood cell counts, like low [place] cell, platelet, or red cell counts  lung or breathing disease, like asthma  recently received or scheduled to receive a vaccine  receiving treatment for cancer  skin conditions or sensitivity  tingling of the fingers or toes, or other nerve disorder  tuberculosis  an unusual or allergic reaction to leflunomide, teriflunomide, other medicines, food, dyes, or preservatives  pregnant or trying to get pregnant  breast-feeding How should I use this medicine? Take this medicine by mouth with a full glass of water. Follow the directions on the prescription label. Take your medicine at regular intervals. Do not take your medicine more often than directed. Do not stop taking except on your doctor's advice. Talk to your pediatrician regarding the use of this medicine in children. Special care may be needed. Overdosage: If you think you have taken too much of this medicine contact a poison control center or emergency room at once. NOTE: This medicine is only for you. Do not share this medicine with others. What if I miss a dose? If you miss a dose, take it as soon as you can. If it is almost time for your next dose, take only that dose. Do not take double or extra doses. What may interact with this medicine? Do not take this medicine with any of the following medications:  teriflunomide This medicine may also interact with the following  medications:  alosetron  birth control pills  caffeine  cefaclor  certain medicines for diabetes like nateglinide, repaglinide, rosiglitazone, pioglitazone  certain medicines for high cholesterol like atorvastatin, pravastatin, rosuvastatin, simvastatin  charcoal  cholestyramine  ciprofloxacin  duloxetine  furosemide  ketoprofen  live virus vaccines  medicines that increase your risk for infection  methotrexate  mitoxantrone  paclitaxel  penicillin  theophylline  tizanidine  warfarin This list may not describe all possible interactions. Give your health care provider a list of all the medicines, herbs, non-prescription drugs, or dietary supplements you use. Also tell them if you smoke, drink alcohol, or use illegal drugs. Some items may interact with your medicine. What should I watch for while using this medicine? Visit your health care provider for regular checks on your progress. Tell your doctor or health care provider if your symptoms do not start to get better or if they get worse. You may need blood work done while you are taking this medicine. This medicine may cause serious skin reactions. They can happen weeks to months after starting the medicine. Contact your health care provider right away if you notice fevers or flu-like symptoms with a rash. The rash may be red or purple and then turn into blisters or peeling of the skin. Or, you might notice a red rash with swelling of the face, lips or lymph nodes in your neck or under your arms. This medicine may stay in your body for up to 2 years after your last dose. Tell your doctor about any unusual side effects or symptoms. A medicine can  be given to help lower your blood levels of this medicine more quickly. Women must use effective birth control with this medicine. There is a potential for serious side effects to an unborn child. Do not become pregnant while taking this medicine. Inform your doctor if you wish  to become pregnant. This medicine remains in your blood after you stop taking it. You must continue using effective birth control until the blood levels have been checked and they are low enough. A medicine can be given to help lower your blood levels of this medicine more quickly. Immediately talk to your doctor if you think you may be pregnant. You may need a pregnancy test. Talk to your health care provider or pharmacist for more information. You should not receive certain vaccines during your treatment and for a certain time after your treatment with this medication ends. Talk to your health care provider for more information. What side effects may I notice from receiving this medicine? Side effects that you should report to your doctor or health care professional as soon as possible:  allergic reactions like skin rash, itching or hives, swelling of the face, lips, or tongue  breathing problems  cough  increased blood pressure  low blood counts - this medicine may decrease the number of white blood cells and platelets. You may be at increased risk for infections and bleeding.  pain, tingling, numbness in the hands or feet  rash, fever, and swollen lymph nodes  redness, blistering, peeing or loosening of the skin, including inside the mouth  signs of decreased platelets or bleeding - bruising, pinpoint red spots on the skin, black, tarry stools, blood in urine  signs of infection - fever or chills, cough, sore throat, pain or trouble passing urine  signs and symptoms of liver injury like dark yellow or brown urine; general ill feeling or flu-like symptoms; light-colored stools; loss of appetite; nausea; right upper belly pain; unusually weak or tired; yellowing of the eyes or skin  trouble passing urine or change in the amount of urine  vomiting Side effects that usually do not require medical attention (report to your doctor or health care professional if they continue or are  bothersome):  diarrhea  hair thinning or loss  headache  nausea  tiredness This list may not describe all possible side effects. Call your doctor for medical advice about side effects. You may report side effects to FDA at 1-800-FDA-1088. Where should I keep my medicine? Keep out of the reach of children. Store at room temperature between 15 and 30 degrees C (59 and 86 degrees F). Protect from moisture and light. Throw away any unused medicine after the expiration date. NOTE: This sheet is a summary. It may not cover all possible information. If you have questions about this medicine, talk to your doctor, pharmacist, or health care provider.  2020 Elsevier/Gold Standard (2018-12-05 15:06:48)    Journal for Nurse Practitioners, 15(4), [phone]. Retrieved June 23, 2018 from http://clinicalkey.com/nursing">  Knee Exercises Ask your health care provider which exercises are safe for you. Do exercises exactly as told by your health care provider and adjust them as directed. It is normal to feel mild stretching, pulling, tightness, or discomfort as you do these exercises. Stop right away if you feel sudden pain or your pain gets worse. Do not begin these exercises until told by your health care provider. Stretching and range-of-motion exercises These exercises warm up your muscles and joints and improve the movement and flexibility of your  knee. These exercises also help to relieve pain and swelling. Knee extension, prone 1. Lie on your abdomen (prone position) on a bed. 2. Place your left / right knee just beyond the edge of the surface so your knee is not on the bed. You can put a towel under your left / right thigh just above your kneecap for comfort. 3. Relax your leg muscles and allow gravity to straighten your knee (extension). You should feel a stretch behind your left / right knee. 4. Hold this position for __________ seconds. 5. Scoot up so your knee is supported between  repetitions. Repeat __________ times. Complete this exercise __________ times a day. Knee flexion, active  1. Lie on your back with both legs straight. If this causes back discomfort, bend your left / right knee so your foot is flat on the floor. 2. Slowly slide your left / right heel back toward your buttocks. Stop when you feel a gentle stretch in the front of your knee or thigh (flexion). 3. Hold this position for __________ seconds. 4. Slowly slide your left / right heel back to the starting position. Repeat __________ times. Complete this exercise __________ times a day. Quadriceps stretch, prone  1. Lie on your abdomen on a firm surface, such as a bed or padded floor. 2. Bend your left / right knee and hold your ankle. If you cannot reach your ankle or pant leg, loop a belt around your foot and grab the belt instead. 3. Gently pull your heel toward your buttocks. Your knee should not slide out to the side. You should feel a stretch in the front of your thigh and knee (quadriceps). 4. Hold this position for __________ seconds. Repeat __________ times. Complete this exercise __________ times a day. Hamstring, supine 1. Lie on your back (supine position). 2. Loop a belt or towel over the ball of your left / right foot. The ball of your foot is on the walking surface, right under your toes. 3. Straighten your left / right knee and slowly pull on the belt to raise your leg until you feel a gentle stretch behind your knee (hamstring). ? Do not let your knee bend while you do this. ? Keep your other leg flat on the floor. 4. Hold this position for __________ seconds. Repeat __________ times. Complete this exercise __________ times a day. Strengthening exercises These exercises build strength and endurance in your knee. Endurance is the ability to use your muscles for a long time, even after they get tired. Quadriceps, isometric This exercise stretches the muscles in front of your thigh  (quadriceps) without moving your knee joint (isometric). 1. Lie on your back with your left / right leg extended and your other knee bent. Put a rolled towel or small pillow under your knee if told by your health care provider. 2. Slowly tense the muscles in the front of your left / right thigh. You should see your kneecap slide up toward your hip or see increased dimpling just above the knee. This motion will push the back of the knee toward the floor. 3. For __________ seconds, hold the muscle as tight as you can without increasing your pain. 4. Relax the muscles slowly and completely. Repeat __________ times. Complete this exercise __________ times a day. Straight leg raises This exercise stretches the muscles in front of your thigh (quadriceps) and the muscles that move your hips (hip flexors). 1. Lie on your back with your left / right leg extended and  your other knee bent. 2. Tense the muscles in the front of your left / right thigh. You should see your kneecap slide up or see increased dimpling just above the knee. Your thigh may even shake a bit. 3. Keep these muscles tight as you raise your leg 4-6 inches (10-15 cm) off the floor. Do not let your knee bend. 4. Hold this position for __________ seconds. 5. Keep these muscles tense as you lower your leg. 6. Relax your muscles slowly and completely after each repetition. Repeat __________ times. Complete this exercise __________ times a day. Hamstring, isometric 1. Lie on your back on a firm surface. 2. Bend your left / right knee about __________ degrees. 3. Dig your left / right heel into the surface as if you are trying to pull it toward your buttocks. Tighten the muscles in the back of your thighs (hamstring) to "dig" as hard as you can without increasing any pain. 4. Hold this position for __________ seconds. 5. Release the tension gradually and allow your muscles to relax completely for __________ seconds after each  repetition. Repeat __________ times. Complete this exercise __________ times a day. Hamstring curls If told by your health care provider, do this exercise while wearing ankle weights. Begin with __________ lb weights. Then increase the weight by 1 lb (0.5 kg) increments. Do not wear ankle weights that are more than __________ lb. 1. Lie on your abdomen with your legs straight. 2. Bend your left / right knee as far as you can without feeling pain. Keep your hips flat against the floor. 3. Hold this position for __________ seconds. 4. Slowly lower your leg to the starting position. Repeat __________ times. Complete this exercise __________ times a day. Squats This exercise strengthens the muscles in front of your thigh and knee (quadriceps). 1. Stand in front of a table, with your feet and knees pointing straight ahead. You may rest your hands on the table for balance but not for support. 2. Slowly bend your knees and lower your hips like you are going to sit in a chair. ? Keep your weight over your heels, not over your toes. ? Keep your lower legs upright so they are parallel with the table legs. ? Do not let your hips go lower than your knees. ? Do not bend lower than told by your health care provider. ? If your knee pain increases, do not bend as low. 3. Hold the squat position for __________ seconds. 4. Slowly push with your legs to return to standing. Do not use your hands to pull yourself to standing. Repeat __________ times. Complete this exercise __________ times a day. Wall slides This exercise strengthens the muscles in front of your thigh and knee (quadriceps). 1. Lean your back against a smooth wall or door, and walk your feet out 18-24 inches (46-61 cm) from it. 2. Place your feet hip-width apart. 3. Slowly slide down the wall or door until your knees bend __________ degrees. Keep your knees over your heels, not over your toes. Keep your knees in line with your hips. 4. Hold this  position for __________ seconds. Repeat __________ times. Complete this exercise __________ times a day. Straight leg raises This exercise strengthens the muscles that rotate the leg at the hip and move it away from your body (hip abductors). 1. Lie on your side with your left / right leg in the top position. Lie so your head, shoulder, knee, and hip line up. You may bend your  bottom knee to help you keep your balance. 2. Roll your hips slightly forward so your hips are stacked directly over each other and your left / right knee is facing forward. 3. Leading with your heel, lift your top leg 4-6 inches (10-15 cm). You should feel the muscles in your outer hip lifting. ? Do not let your foot drift forward. ? Do not let your knee roll toward the ceiling. 4. Hold this position for __________ seconds. 5. Slowly return your leg to the starting position. 6. Let your muscles relax completely after each repetition. Repeat __________ times. Complete this exercise __________ times a day. Straight leg raises This exercise stretches the muscles that move your hips away from the front of the pelvis (hip extensors). 1. Lie on your abdomen on a firm surface. You can put a pillow under your hips if that is more comfortable. 2. Tense the muscles in your buttocks and lift your left / right leg about 4-6 inches (10-15 cm). Keep your knee straight as you lift your leg. 3. Hold this position for __________ seconds. 4. Slowly lower your leg to the starting position. 5. Let your leg relax completely after each repetition. Repeat __________ times. Complete this exercise __________ times a day. This information is not intended to replace advice given to you by your health care provider. Make sure you discuss any questions you have with your health care provider. Document Revised: 06/24/2018 Document Reviewed: 06/24/2018 Elsevier Patient Education  2020 [employer]  Hand Exercises Hand exercises can be helpful  for almost anyone. These exercises can strengthen the hands, improve flexibility and movement, and increase blood flow to the hands. These results can make work and daily tasks easier. Hand exercises can be especially helpful for people who have joint pain from arthritis or have nerve damage from overuse (carpal tunnel syndrome). These exercises can also help people who have injured a hand. Exercises Most of these hand exercises are gentle stretching and motion exercises. It is usually safe to do them often throughout the day. Warming up your hands before exercise may help to reduce stiffness. You can do this with gentle massage or by placing your hands in warm water for 10-15 minutes. It is normal to feel some stretching, pulling, tightness, or mild discomfort as you begin new exercises. This will gradually improve. Stop an exercise right away if you feel sudden, severe pain or your pain gets worse. Ask your health care provider which exercises are best for you. Knuckle bend or "claw" fist 1. Stand or sit with your arm, hand, and all five fingers pointed straight up. Make sure to keep your wrist straight during the exercise. 2. Gently bend your fingers down toward your palm until the tips of your fingers are touching the top of your palm. Keep your big knuckle straight and just bend the small knuckles in your fingers. 3. Hold this position for __________ seconds. 4. Straighten (extend) your fingers back to the starting position. Repeat this exercise 5-10 times with each hand. Full finger fist 1. Stand or sit with your arm, hand, and all five fingers pointed straight up. Make sure to keep your wrist straight during the exercise. 2. Gently bend your fingers into your palm until the tips of your fingers are touching the middle of your palm. 3. Hold this position for __________ seconds. 4. Extend your fingers back to the starting position, stretching every joint fully. Repeat this exercise 5-10 times  with each hand. Straight fist 1.  Stand or sit with your arm, hand, and all five fingers pointed straight up. Make sure to keep your wrist straight during the exercise. 2. Gently bend your fingers at the big knuckle, where your fingers meet your hand, and the middle knuckle. Keep the knuckle at the tips of your fingers straight and try to touch the bottom of your palm. 3. Hold this position for __________ seconds. 4. Extend your fingers back to the starting position, stretching every joint fully. Repeat this exercise 5-10 times with each hand. Tabletop 1. Stand or sit with your arm, hand, and all five fingers pointed straight up. Make sure to keep your wrist straight during the exercise. 2. Gently bend your fingers at the big knuckle, where your fingers meet your hand, as far down as you can while keeping the small knuckles in your fingers straight. Think of forming a tabletop with your fingers. 3. Hold this position for __________ seconds. 4. Extend your fingers back to the starting position, stretching every joint fully. Repeat this exercise 5-10 times with each hand. Finger spread 1. Place your hand flat on a table with your palm facing down. Make sure your wrist stays straight as you do this exercise. 2. Spread your fingers and thumb apart from each other as far as you can until you feel a gentle stretch. Hold this position for __________ seconds. 3. Bring your fingers and thumb tight together again. Hold this position for __________ seconds. Repeat this exercise 5-10 times with each hand. Making circles 1. Stand or sit with your arm, hand, and all five fingers pointed straight up. Make sure to keep your wrist straight during the exercise. 2. Make a circle by touching the tip of your thumb to the tip of your index finger. 3. Hold for __________ seconds. Then open your hand wide. 4. Repeat this motion with your thumb and each finger on your hand. Repeat this exercise 5-10 times with each  hand. Thumb motion 1. Sit with your forearm resting on a table and your wrist straight. Your thumb should be facing up toward the ceiling. Keep your fingers relaxed as you move your thumb. 2. Lift your thumb up as high as you can toward the ceiling. Hold for __________ seconds. 3. Bend your thumb across your palm as far as you can, reaching the tip of your thumb for the small finger (pinkie) side of your palm. Hold for __________ seconds. Repeat this exercise 5-10 times with each hand. Grip strengthening  1. Hold a stress ball or other soft ball in the middle of your hand. 2. Slowly increase the pressure, squeezing the ball as much as you can without causing pain. Think of bringing the tips of your fingers into the middle of your palm. All of your finger joints should bend when doing this exercise. 3. Hold your squeeze for __________ seconds, then relax. Repeat this exercise 5-10 times with each hand. Contact a health care provider if:  Your hand pain or discomfort gets much worse when you do an exercise.  Your hand pain or discomfort does not improve within 2 hours after you exercise. If you have any of these problems, stop doing these exercises right away. Do not do them again unless your health care provider says that you can. Get help right away if:  You develop sudden, severe hand pain or swelling. If this happens, stop doing these exercises right away. Do not do them again unless your health care provider says that you can. This  information is not intended to replace advice given to you by your health care provider. Make sure you discuss any questions you have with your health care provider. Document Revised: 12/25/2018 Document Reviewed: 09/04/2018 Elsevier Patient Education  2020 [employer].

## 2020-09-23 ENCOUNTER — Telehealth: Payer: Self-pay | Admitting: *Deleted

## 2020-09-23 DIAGNOSIS — Z79899 Other long term (current) drug therapy: Secondary | ICD-10-CM

## 2020-09-23 LAB — COMPLETE METABOLIC PANEL WITH GFR
AG Ratio: 1.4 (calc) (ref 1.0–2.5)
ALT: 17 U/L (ref 6–29)
AST: 24 U/L (ref 10–35)
Albumin: 4 g/dL (ref 3.6–5.1)
Alkaline phosphatase (APISO): 105 U/L (ref 37–153)
BUN: 15 mg/dL (ref 7–25)
CO2: 26 mmol/L (ref 20–32)
Calcium: 9.6 mg/dL (ref 8.6–10.4)
Chloride: 106 mmol/L (ref 98–110)
Creat: 0.74 mg/dL (ref 0.50–1.05)
GFR, Est African American: 103 mL/min/{1.73_m2} (ref >=60)
GFR, Est Non African American: 89 mL/min/{1.73_m2} (ref >=60)
Globulin: 2.8 g/dL (calc) (ref 1.9–3.7)
Glucose, Bld: 86 mg/dL (ref 65–99)
Potassium: 4.3 mmol/L (ref 3.5–5.3)
Sodium: 140 mmol/L (ref 135–146)
Total Bilirubin: 0.4 mg/dL (ref 0.2–1.2)
Total Protein: 6.8 g/dL (ref 6.1–8.1)

## 2020-09-23 LAB — CBC WITH DIFFERENTIAL/PLATELET
Absolute Monocytes: 758 cells/uL (ref 200–950)
Basophils Absolute: 51 cells/uL (ref 0–200)
Basophils Relative: 0.9 %
Eosinophils Absolute: 80 cells/uL (ref 15–500)
Eosinophils Relative: 1.4 %
HCT: 38.8 % (ref 35.0–45.0)
Hemoglobin: 13.3 g/dL (ref 11.7–15.5)
Lymphs Abs: 798 cells/uL — ABNORMAL LOW (ref 850–3900)
MCH: 30 pg (ref 27.0–33.0)
MCHC: 34.3 g/dL (ref 32.0–36.0)
MCV: 87.4 fL (ref 80.0–100.0)
MPV: 10.4 fL (ref 7.5–12.5)
Monocytes Relative: 13.3 %
Neutro Abs: 4013 cells/uL (ref 1500–7800)
Neutrophils Relative %: 70.4 %
Platelets: 304 10*3/uL (ref 140–400)
RBC: 4.44 10*6/uL (ref 3.80–5.10)
RDW: 11.5 % (ref 11.0–15.0)
Total Lymphocyte: 14 %
WBC: 5.7 10*3/uL (ref 3.8–10.8)

## 2020-09-23 NOTE — Progress Notes (Signed)
Absolute lymphocytes are mildly low.  WBC count is WNL.  Please advise the patient to return in 1 month to recheck CBC.  CMP WNL.

## 2020-09-23 NOTE — Telephone Encounter (Signed)
-----   Message from Gearldine Bienenstock, PA-C sent at 09/23/2020  8:06 AM EST ----- Absolute lymphocytes are mildly low.  WBC count is WNL.  Please advise the patient to return in 1 month to recheck CBC.  CMP WNL.

## 2020-12-01 ENCOUNTER — Other Ambulatory Visit: Payer: Self-pay | Admitting: Physician Assistant

## 2020-12-01 DIAGNOSIS — M0579 Rheumatoid arthritis with rheumatoid factor of multiple sites without organ or systems involvement: Secondary | ICD-10-CM

## 2020-12-01 NOTE — Telephone Encounter (Signed)
Last Visit: 09/22/2020 Next Visit: 6/92022 Labs: 09/22/2020, Absolute lymphocytes are mildly low. WBC count is WNL. Please advise the patient to return in 1 month to recheck CBC. CMP WNL. Eye exam: 08/18/2019, Mailbox full, cannot leave message  Current Dose per office note 09/22/2020, Plaquenil 200 mg 1 tablet BID   EB:[id number] arthritis involving multiple sites with positive rheumatoid factor   Last Fill: 06/03/2020  Okay to refill Plaquenil?

## 2020-12-20 ENCOUNTER — Other Ambulatory Visit: Payer: Self-pay | Admitting: Physician Assistant

## 2020-12-20 DIAGNOSIS — M0579 Rheumatoid arthritis with rheumatoid factor of multiple sites without organ or systems involvement: Secondary | ICD-10-CM

## 2020-12-21 ENCOUNTER — Other Ambulatory Visit: Payer: Self-pay | Admitting: *Deleted

## 2020-12-21 DIAGNOSIS — Z79899 Other long term (current) drug therapy: Secondary | ICD-10-CM

## 2020-12-21 LAB — CBC WITH DIFFERENTIAL/PLATELET
Absolute Monocytes: 539 cells/uL (ref 200–950)
Basophils Absolute: 72 cells/uL (ref 0–200)
Basophils Relative: 1.3 %
Eosinophils Absolute: 121 cells/uL (ref 15–500)
Eosinophils Relative: 2.2 %
HCT: 42.3 % (ref 35.0–45.0)
Hemoglobin: 14.1 g/dL (ref 11.7–15.5)
Lymphs Abs: 2206 cells/uL (ref 850–3900)
MCH: 29.5 pg (ref 27.0–33.0)
MCHC: 33.3 g/dL (ref 32.0–36.0)
MCV: 88.5 fL (ref 80.0–100.0)
MPV: 10.4 fL (ref 7.5–12.5)
Monocytes Relative: 9.8 %
Neutro Abs: 2563 cells/uL (ref 1500–7800)
Neutrophils Relative %: 46.6 %
Platelets: 275 10*3/uL (ref 140–400)
RBC: 4.78 10*6/uL (ref 3.80–5.10)
RDW: 12.4 % (ref 11.0–15.0)
Total Lymphocyte: 40.1 %
WBC: 5.5 10*3/uL (ref 3.8–10.8)

## 2020-12-21 MED ORDER — HYDROXYCHLOROQUINE SULFATE 200 MG PO TABS: 200.0000 mg | ORAL_TABLET | Freq: Two times a day (BID) | ORAL | 0 refills | Status: DC

## 2020-12-21 NOTE — Telephone Encounter (Signed)
Last Visit: 09/22/2020  Next Visit: 02/23/2021  Labs: 09/22/2020, Absolute lymphocytes are mildly low. WBC count is WNL. Please advise the patient to return in 1 month to recheck CBC. CMP WNL  Eye exam: 08/18/2019  I called patient, PLQ eye exam due and labs due. Patient states she had eye exam 09/2020 and she will fax results to office. Patient will try to have lab drawn tomorrow.   Current Dose per office note 09/22/2020, Plaquenil 200 mg 1 tablet BID   DX: Rheumatoid arthritis involving multiple sites with positive rheumatoid factor   Last Fill: 12/01/2020  Okay to refill Plaquenil?

## 2021-02-09 NOTE — Progress Notes (Deleted)
Office Visit Note  Patient: Courtney Kramer             Date of Birth: October 19, 1960           MRN: [id number]             PCP: Dema Severin, NP Referring: Dema Severin, NP Visit Date: 02/23/2021 Occupation: @GUAROCC @  Subjective:  No chief complaint on file.   History of Present Illness: Courtney Kramer is a 60 y.o. female ***   Activities of Daily Living:  Patient reports morning stiffness for *** {minute/hour:19697}.   Patient {ACTIONS;DENIES/REPORTS:21021675::"Denies"} nocturnal pain.  Difficulty dressing/grooming: {ACTIONS;DENIES/REPORTS:21021675::"Denies"} Difficulty climbing stairs: {ACTIONS;DENIES/REPORTS:21021675::"Denies"} Difficulty getting out of chair: {ACTIONS;DENIES/REPORTS:21021675::"Denies"} Difficulty using hands for taps, buttons, cutlery, and/or writing: {ACTIONS;DENIES/REPORTS:21021675::"Denies"}  No Rheumatology ROS completed.   PMFS History:  Patient Active Problem List   Diagnosis Date Noted  . Rheumatoid arthritis involving multiple sites with positive rheumatoid factor (HCC) 04/21/2020  . High risk medication use 04/21/2020  . Primary osteoarthritis of both hands 04/21/2020  . Primary osteoarthritis of both knees 04/21/2020  . Primary osteoarthritis of both feet 04/21/2020  . DDD (degenerative disc disease), lumbar 04/21/2020  . Fracture, Colles, right, closed 04/09/2018  . Closed fracture of right distal radius and ulna, initial encounter 03/27/2018  . Lumbar radiculopathy 09/19/2016  . RENAL CALCULUS 05/04/2008  . FLANK PAIN, RIGHT 05/04/2008    Past Medical History:  Diagnosis Date  . Anxiety   . Chronic back pain   . Chronic back pain   . GERD (gastroesophageal reflux disease)   . History of cardiac murmur    07-26-2020  per pt happen after last pregnancy, work-up done was ok, and was told few years pcp no longer hears it  . History of kidney stones   . Kidney stones   . OA (osteoarthritis)    hands , knees, feet  . RA (rheumatoid  arthritis) [facility])    rheumotology--- Dr [facility]. Deveshwar--- multiple sites seropositive/ positive rheumotoid factor,  taking plaquinel  . Right ureteral calculus   . Wears glasses     Family History  Problem Relation Age of Onset  . Fibromyalgia Mother   . Heart disease Mother   . Macular degeneration Mother   . Rheum arthritis Father   . Rheum arthritis Sister   . Cancer Brother   . Breast cancer Neg Hx    Past Surgical History:  Procedure Laterality Date  . COLONOSCOPY  2012  . CYSTO/  RIGHT URETERAL STENT PLACEMENT  05-10-2008  . CYSTOSCOPY W/ URETERAL STENT PLACEMENT Right 06/09/2014   Procedure: CYSTOSCOPY WITH RETROGRADE PYELOGRAM/URETERAL STENT PLACEMENT;  Surgeon: 06/11/2014, MD;  Location: [facility];  Service: Urology;  Laterality: Right;  . CYSTOSCOPY W/ URETERAL STENT PLACEMENT Left 02/17/2020   Procedure: CYSTOSCOPY WITH RETROGRADE PYELOGRAM/URETERAL STENT PLACEMENT;  Surgeon: 04/18/2020, MD;  Location: [facility];  Service: Urology;  Laterality: Left;  45 MINS  . CYSTOSCOPY/URETEROSCOPY/HOLMIUM LASER/STENT PLACEMENT Left 02/22/2020   Procedure: CYSTOSCOPY/LEFT URETEROSCOPY/HOLMIUM LASER/STENT EXCHANGE;  Surgeon: 04/23/2020, MD;  Location: [facility];  Service: Urology;  Laterality: Left;  . CYSTOSCOPY/URETEROSCOPY/HOLMIUM LASER/STENT PLACEMENT Right 07/28/2020   Procedure: CYSTOSCOPY RIGHT URETEROSCOPY RIGHT RETROGRADE PYELOGRAM HOLMIUM LASER/STENT PLACEMENT;  Surgeon: 13/07/2020, MD;  Location: [facility];  Service: Urology;  Laterality: Right;  . EXTRACORPOREAL SHOCK WAVE LITHOTRIPSY Right 05-10-2008  . KNEE ARTHROSCOPY Left 2009  . OPEN REDUCTION INTERNAL FIXATION (ORIF) DISTAL RADIAL FRACTURE Right 03/27/2018   Procedure:  OPEN REDUCTION INTERNAL FIXATION (ORIF) RIGHT DISTAL RADIUS FRACTURE;  Surgeon: Tarry Kos, MD;  Location: [facility];  Service: Orthopedics;  Laterality: Right;  . WISDOM TOOTH EXTRACTION  AGE 60   Social  History   Social History Narrative  . Not on file    There is no immunization history on file for this patient.   Objective: Vital Signs: There were no vitals taken for this visit.   Physical Exam   Musculoskeletal Exam: ***  CDAI Exam: CDAI Score: -- Patient Global: --; Provider Global: -- Swollen: --; Tender: -- Joint Exam 02/23/2021   No joint exam has been documented for this visit   There is currently no information documented on the homunculus. Go to the Rheumatology activity and complete the homunculus joint exam.  Investigation: No additional findings.  Imaging: No results found.  Recent Labs: Lab Results  Component Value Date   WBC 5.5 12/21/2020   HGB 14.1 12/21/2020   PLT 275 12/21/2020   NA 140 09/22/2020   K 4.3 09/22/2020   CL 106 09/22/2020   CO2 26 09/22/2020   GLUCOSE 86 09/22/2020   BUN 15 09/22/2020   CREATININE 0.74 09/22/2020   BILITOT 0.4 09/22/2020   AST 24 09/22/2020   ALT 17 09/22/2020   PROT 6.8 09/22/2020   CALCIUM 9.6 09/22/2020   GFRAA 103 09/22/2020   QFTBGOLDPLUS NEGATIVE 07/22/2019    Speciality Comments: PLQ eye exam: 10/20/2020 WNL. [facility] f/u 1 year.  Procedures:  No procedures performed Allergies: Sulfa antibiotics   Assessment / Plan:     Visit Diagnoses: Rheumatoid arthritis involving multiple sites with positive rheumatoid factor (HCC)  High risk medication use  Primary osteoarthritis of both hands  Primary osteoarthritis of both knees  Primary osteoarthritis of both feet  DDD (degenerative disc disease), lumbar  Family history of rheumatoid arthritis  Closed Colles' fracture of right radius with routine healing, subsequent encounter  RENAL CALCULUS  Orders: No orders of the defined types were placed in this encounter.  No orders of the defined types were placed in this encounter.   Face-to-face time spent with patient was *** minutes. Greater than 50% of time was spent in counseling and  coordination of care.  Follow-Up Instructions: No follow-ups on file.   Gearldine Bienenstock, PA-C  Note - This record has been created using Dragon software.  Chart creation errors have been sought, but may not always  have been located. Such creation errors do not reflect on  the standard of medical care.

## 2021-02-19 ENCOUNTER — Other Ambulatory Visit: Payer: Self-pay | Admitting: Physician Assistant

## 2021-02-19 DIAGNOSIS — M0579 Rheumatoid arthritis with rheumatoid factor of multiple sites without organ or systems involvement: Secondary | ICD-10-CM

## 2021-02-20 NOTE — Progress Notes (Addendum)
Office Visit Note  Patient: Courtney Kramer             Date of Birth: 02-07-1961           MRN: [id number]             PCP: Dema Severin, NP Referring: Dema Severin, NP Visit Date: 02/22/2021 Occupation: @  Subjective:  Right shoulder joint pain   History of Present Illness: Courtney Kramer is a 60 y.o. female with history of seropositive rheumatoid arthritis, DDD, and osteoarthritis. She is taking plaquenil 200 mg 1 tablet by mouth twice daily and celebrex 200 mg 1 capsule daily for pain relief.  She presents today with pain in the right shoulder that started on Friday.  She states last week she was shoveling but did not experience any discomfort at that time.  She denies any injury.  She states she woke up with the pain and has had persistent discomfort since then. She is having difficulty raising her right arm.  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  She states she continues to have pain in bilateral 3rd fingers and occasional discomfort in the left knee joint.  She has not missed any doses of PLQ recently.  She states she is ready to discuss other treatment options.    Activities of Daily Living:  Patient reports morning stiffness for less than 1 hour.   Patient Reports nocturnal pain.  Difficulty dressing/grooming: Reports Difficulty climbing stairs: Denies Difficulty getting out of chair: Denies Difficulty using hands for taps, buttons, cutlery, and/or writing: Reports  Review of Systems  Constitutional: Positive for fatigue.  HENT: Negative for mouth sores, mouth dryness and nose dryness.   Eyes: Negative for pain, itching and dryness.  Respiratory: Negative for shortness of breath and difficulty breathing.   Cardiovascular: Negative for chest pain and palpitations.  Gastrointestinal: Negative for blood in stool, constipation and diarrhea.  Endocrine: Negative  for increased urination.  Genitourinary: Negative for difficulty urinating.  Musculoskeletal: Positive for arthralgias, joint pain, myalgias, morning stiffness, muscle tenderness and myalgias. Negative for joint swelling.  Skin: Negative for color change, rash and redness.  Allergic/Immunologic: Negative for susceptible to infections.  Neurological: Negative for dizziness, numbness, headaches, memory loss and weakness.  Hematological: Negative for bruising/bleeding tendency.  Psychiatric/Behavioral: Negative for confusion.    PMFS History:  Patient Active Problem List   Diagnosis Date Noted  . Rheumatoid arthritis involving multiple sites with positive rheumatoid factor (HCC) 04/21/2020  . High risk medication use 04/21/2020  . Primary osteoarthritis of both hands 04/21/2020  . Primary osteoarthritis of both knees 04/21/2020  . Primary osteoarthritis of both feet 04/21/2020  . DDD (degenerative disc disease), lumbar 04/21/2020  . Fracture, Colles, right, closed 04/09/2018  . Closed fracture of right distal radius and ulna, initial encounter 03/27/2018  . Lumbar radiculopathy 09/19/2016  . RENAL CALCULUS 05/04/2008  . FLANK PAIN, RIGHT 05/04/2008    Past Medical History:  Diagnosis Date  . Anxiety   . Chronic back pain   . Chronic back pain   . GERD (gastroesophageal reflux disease)   . History of cardiac murmur    07-26-2020  per pt happen after last pregnancy, work-up done was ok, and was told few years pcp no longer hears it  . History of kidney stones   . Kidney stones   . OA (osteoarthritis)  hands , knees, feet  . RA (rheumatoid arthritis) [facility])    rheumotology--- Dr Kathie Rhodes. Deveshwar--- multiple sites seropositive/ positive rheumotoid factor,  taking plaquinel  . Right ureteral calculus   . Wears glasses     Family History  Problem Relation Age of Onset  . Fibromyalgia Mother   . Heart disease Mother   . Macular degeneration Mother   . Rheum arthritis Father   .  Rheum arthritis Sister   . Cancer Brother   . Breast cancer Neg Hx    Past Surgical History:  Procedure Laterality Date  . COLONOSCOPY  2012  . CYSTO/  RIGHT URETERAL STENT PLACEMENT  05-10-2008  . CYSTOSCOPY W/ URETERAL STENT PLACEMENT Right 06/09/2014   Procedure: CYSTOSCOPY WITH RETROGRADE PYELOGRAM/URETERAL STENT PLACEMENT;  Surgeon: Valetta Fuller, MD;  Location: [facility];  Service: Urology;  Laterality: Right;  . CYSTOSCOPY W/ URETERAL STENT PLACEMENT Left 02/17/2020   Procedure: CYSTOSCOPY WITH RETROGRADE PYELOGRAM/URETERAL STENT PLACEMENT;  Surgeon: Sebastian Ache, MD;  [facility];  Service: Urology;  Laterality: Left;  45 MINS  . CYSTOSCOPY/URETEROSCOPY/HOLMIUM LASER/STENT PLACEMENT Left 02/22/2020   Procedure: CYSTOSCOPY/LEFT URETEROSCOPY/HOLMIUM LASER/STENT EXCHANGE;  Surgeon: [name], MD;  [facility];  Service: Urology;  Laterality: Left;  . CYSTOSCOPY/URETEROSCOPY/HOLMIUM LASER/STENT PLACEMENT Right 07/28/2020   Procedure: CYSTOSCOPY RIGHT URETEROSCOPY RIGHT RETROGRADE PYELOGRAM HOLMIUM LASER/STENT PLACEMENT;  Surgeon: [name], MD;  Location: [facility];  Service: Urology;  Laterality: Right;  . EXTRACORPOREAL SHOCK WAVE LITHOTRIPSY Right 05-10-2008  . KNEE ARTHROSCOPY Left 2009  . OPEN REDUCTION INTERNAL FIXATION (ORIF) DISTAL RADIAL FRACTURE Right 03/27/2018   Procedure: OPEN REDUCTION INTERNAL FIXATION (ORIF) RIGHT DISTAL RADIUS FRACTURE;  Surgeon: Tarry Kos, MD;  Location: [facility];  Service: Orthopedics;  Laterality: Right;  . WISDOM TOOTH EXTRACTION  AGE 7   Social History   Social History Narrative  . Not on file    There is no immunization history on file for this patient.   Objective: Vital Signs: BP 127/82 (BP Location: Left Arm, Patient Position: Sitting, Cuff Size: Normal)   Pulse 99   Resp 16   Ht  (1.676 m)   Wt 195 lb 6.4 oz (88.6 kg)   BMI 31.54 kg/m    Physical Exam Vitals  and nursing note reviewed.  Constitutional:      Appearance: She is well-developed.  HENT:     Head: Normocephalic and atraumatic.  Eyes:     Conjunctiva/sclera: Conjunctivae normal.  Pulmonary:     Effort: Pulmonary effort is normal.  Abdominal:     Palpations: Abdomen is soft.  Musculoskeletal:     Cervical back: Normal range of motion.  Skin:    General: Skin is warm and dry.     Capillary Refill: Capillary refill takes less than 2 seconds.  Neurological:     Mental Status: She is alert and oriented to person, place, and time.  Psychiatric:        Behavior: Behavior normal.      Musculoskeletal Exam: C-spine, thoracic spine, and lumbar spine good ROM.  Painful and limited active and passive ROM of the right shoulder.  Tenderness over the [facility] joint and anterior surface of right shoulder.  Left shoulder has good ROM with no discomfort.  Elbow joint and wrist joints have good ROM.  Tenderness over bilateral 3rd PIP joints.  Complete fist formation bilaterally.  Hip joints good ROM with no discomfort.  Warmth of the left knee joint.  Good ROM of both knees with some discomfort in the left knee.  Ankle joints good ROM with no tenderness or inflammation. No tenderness over MTP joints.   CDAI Exam: CDAI Score: 4.6  Patient Global: 10 mm; Provider Global: 6 mm Swollen: 0 ; Tender: 3  Joint Exam 02/22/2021      Right  Left  Glenohumeral   Tender     PIP 3   Tender   Tender     Investigation: No additional findings.  Imaging: XR Shoulder Right  Result Date: 02/22/2021 No glenohumeral or acromioclavicular arthritis was noted.  Extra-articular calcification was noted in the subacromial region. Impression: No glenohumeral or acromioclavicular arthritis was noted.   Recent Labs: Lab Results  Component Value Date   WBC 5.5 12/21/2020   HGB 14.1 12/21/2020   PLT 275 12/21/2020   NA 140 09/22/2020   K 4.3 09/22/2020   CL 106 09/22/2020   CO2 26 09/22/2020   GLUCOSE 86  09/22/2020   BUN 15 09/22/2020   CREATININE 0.74 09/22/2020   BILITOT 0.4 09/22/2020   AST 24 09/22/2020   ALT 17 09/22/2020   PROT 6.8 09/22/2020   CALCIUM 9.6 09/22/2020   GFRAA 103 09/22/2020   QFTBGOLDPLUS NEGATIVE 07/22/2019    Speciality Comments: PLQ eye exam: 10/20/2020 WNL. [facility] f/u 1 year.  Procedures:  Large Joint Inj: R glenohumeral on 02/22/2021 11:23 AM Indications: pain Details: 27 G 1.5 in needle, anterior approach  Arthrogram: No  Medications: 40 mg triamcinolone acetonide 40 MG/ML; 1 mL lidocaine 1 % Aspirate: 0 mL Outcome: tolerated well, no immediate complications Procedure, treatment alternatives, risks and benefits explained, specific risks discussed. Consent was given by the patient. Immediately prior to procedure a time out was called to verify the correct patient, procedure, equipment, support staff and site/side marked as required. Patient was prepped and draped in the usual sterile fashion.     Allergies: Sulfa antibiotics   Assessment / Plan:     Visit Diagnoses: Rheumatoid arthritis involving multiple sites with positive rheumatoid factor (HCC) -Positive rheumatoid factor, negative CCP: She presents today with pain and limited ROM in the right shoulder joint, which started 5 days ago.  She has ongoing tenderness over the 3rd PIP joints in both hands.  She continues to have occasional discomfort in the left knee joint and warmth on exam today.  She has been taking plaquenil 200 mg 1 tablet by mouth twice daily and celebrex 200 mg 1 capsule by mouth daily for pain relief.  She continues to tolerate PLQ without any side effects and has not missed any doses.  At her last office visit on 09/22/20 we discussed adding on arava as combination therapy with PLQ but she declined at that time.  She declined MTX in the past. She is ready to start on arava.  Indications, contraindications, and potential side effects of arava were discussed in detail.  All questions  were addressed and consent was obtained. She will start on arava 10 mg 1 tablet daily x2 weeks and if labs are stable she will increase to 20 mg daily.  Prescription for arava is pending lab results.  Discussed lab monitoring schedule.  She will continue on PLQ as prescribed. X-rays of the right shoulder were unremarkable today. Her right shoulder was injected with cortisone today.  She was advised to notify us if she has persistent pain in the right shoulder.  She will follow up in 6-8 weeks to assess her response to  combination therapy.   Medication counseling:   Baseline Immunosuppressant Therapy Labs  Quantiferon TB Gold Latest Ref Rng & Units 07/22/2019  Quantiferon TB Gold Plus NEGATIVE NEGATIVE    Hepatitis Latest Ref Rng & Units 07/22/2019  Hep B Surface Ag NON-REACTI NON-REACTIVE  Hep B IgM NON-REACTI NON-REACTIVE  Hep C Ab NON-REACTI NON-REACTIVE  Hep C Ab NON-REACTI NON-REACTIVE    Lab Results  Component Value Date   HIV NON-REACTIVE 07/22/2019    Immunoglobulin Electrophoresis Latest Ref Rng & Units 07/22/2019  IgA  47 - 310 mg/dL 497  IgG 026 - 3,785 mg/dL 885  IgM 50 - 027 mg/dL 741(O)    Serum Protein Electrophoresis Latest Ref Rng & Units 09/22/2020  Total Protein 6.1 - 8.1 g/dL 6.8  Albumin 3.8 - 4.8 g/dL -  Alpha-1 0.2 - 0.3 g/dL -  Alpha-2 0.5 - 0.9 g/dL -  Beta Globulin 0.4 - 0.6 g/dL -  Beta 2 0.2 - 0.5 g/dL -  Gamma Globulin 0.8 - 1.7 g/dL -    Lab Results  Component Value Date   G6PDH 13.0 07/22/2019   Patient was counseled on the purpose, proper use, and adverse effects of leflunomide including risk of infection, nausea/diarrhea/weight loss, increase in blood pressure, rash, hair loss, tingling in the hands and feet, and signs and symptoms of interstitial lung disease.   Also counseled on Black Box warning of liver injury and importance of avoiding alcohol while on therapy. Discussed that there is the possibility of an increased risk of malignancy but it  is not well understood if this increased risk is due to the medication or the disease state.  Counseled patient to avoid live vaccines. Recommend annual influenza, Pneumovax 23, Prevnar 13, and Shingrix as indicated.   Discussed the importance of frequent monitoring of liver function and blood count.  Standing orders placed.  Discussed importance of birth control while on leflunomide due to risk of congenital abnormalities, and patient confirms she is postmenopausal. Provided patient with educational materials on leflunomide and answered all questions.  Patient consented to [place] use, and consent will be uploaded into the media tab.   Patient dose will be 10 mg daily x2 weeks and if labs are stable she will increase to 20 mg daily.  Prescription pending lab results and/or insurance approval.  High risk medication use -She will be starting on Arava 10 mg 1 tablet daily x2 weeks and if labs are stable she will increase to 20 mg daily.  Prescription pending lab results today.  She will continue on Plaquenil 200 mg 1 tablet by mouth twice daily (started mid-November 2020). PLQ eye exam: 10/20/2020 WNL. [facility] f/u 1 year.   CBC and CMP were drawn today.  She will return for lab work in 2 weeks x2, 2 months, then every 3 months.  Standing orders for CBC and CMP are in place.  - Plan: CBC with Differential/Platelet, COMPLETE METABOLIC PANEL WITH GFR Advised to hold Arava if she develops signs or symptoms of an infection and to resume once the infection has completely cleared.   Acute pain of right shoulder -She presents today with severe right shoulder joint pain that started 5 days ago. She did not have any injury prior to the onset of symptoms but she was shoveling last week, which could have contributed to her symptoms.  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  She has significant pain and limitation with active ROM.  Limited passive ROM as well.  she has tried taking  tramadol and continues to take celebrex 200 mg 1 capsule daily for pain relief.  X-rays of the right shoulder were obtained today.  Different treatment options were discussed today.  She requested a right shoulder cortisone injection today.  Procedure note completed above.  Aftercare was discussed.  She was given a handout of shoulder joint exercises to perform once her symptoms have improved. She was advised to notify us if her discomfort persists or worsens. Plan: XR Shoulder Right  Primary osteoarthritis of both hands: She has PIP and DIP thickening consistent with osteoarthritis of both hands. Tenderness over bilateral 3rd PIP joints.  Complete fist formation bilaterally.  Discussed the importance of joint protection and muscle strengthening.   Primary osteoarthritis of both knees: She has occasional discomfort in the left knee joint.  She has some discomfort with ROM on exam today. Warmth of the left knee noted.  No warmth or effusion of the right knee.  Primary osteoarthritis of both feet: She is not experiencing any discomfort in her feet at this time.  She has good ROM of both ankle joints with no tenderness or inflammation.  DDD (degenerative disc disease), lumbar: She is not having any discomfort in her lower back at this time. No symptoms of radiculopathy.   Other medical conditions are listed as follows:   Closed Colles' fracture of right radius with routine healing, subsequent encounter  RENAL CALCULUS  Other fatigue  Family history of rheumatoid arthritis - Father and 2 of her sisters.    Orders: Orders Placed This Encounter  Procedures  . Large Joint Inj: R subacromial bursa  . XR Shoulder Right  . CBC with Differential/Platelet  . COMPLETE METABOLIC PANEL WITH GFR   Meds ordered this encounter  Medications  . triamcinolone acetonide (KENALOG-40) injection 40 mg  . lidocaine (XYLOCAINE) 1 % (with pres) injection 1 mL   Follow-Up Instructions: Return in about 5 months  (around 07/25/2021) for Rheumatoid arthritis, Osteoarthritis, DDD.   Pollyann Savoy, MD  Note - This record has been created using Animal nutritionist.  Chart creation errors have been sought, but may not always  have been located. Such creation errors do not reflect on  the standard of medical care.

## 2021-02-20 NOTE — Telephone Encounter (Signed)
Last Visit: 09/22/2020  Next Visit: 02/23/2021  Labs: 09/22/2020, Absolute lymphocytes are mildly low. WBC count is WNL. CMP WNL 12/21/2020: CMP WNL  Eye exam:  10/20/2020 WNL   Current Dose per office note 09/22/2020, Plaquenil 200 mg 1 tablet BID   [id number] arthritis involving multiple sites with positive rheumatoid factor   Last Fill: 12/21/2020 (30 Day supply)  Okay to refill Plaquenil?

## 2021-02-22 ENCOUNTER — Ambulatory Visit: Payer: Managed Care, Other (non HMO) | Admitting: Physician Assistant

## 2021-02-22 ENCOUNTER — Other Ambulatory Visit: Payer: Self-pay

## 2021-02-22 ENCOUNTER — Ambulatory Visit: Payer: Self-pay

## 2021-02-22 ENCOUNTER — Encounter: Payer: Self-pay | Admitting: Physician Assistant

## 2021-02-22 VITALS — BP 127/82 | HR 99 | Resp 16 | Ht 66.0 in | Wt 195.4 lb

## 2021-02-22 DIAGNOSIS — Z79899 Other long term (current) drug therapy: Secondary | ICD-10-CM

## 2021-02-22 DIAGNOSIS — M0579 Rheumatoid arthritis with rheumatoid factor of multiple sites without organ or systems involvement: Secondary | ICD-10-CM | POA: Diagnosis not present

## 2021-02-22 DIAGNOSIS — M19071 Primary osteoarthritis, right ankle and foot: Secondary | ICD-10-CM

## 2021-02-22 DIAGNOSIS — M19042 Primary osteoarthritis, left hand: Secondary | ICD-10-CM

## 2021-02-22 DIAGNOSIS — M19041 Primary osteoarthritis, right hand: Secondary | ICD-10-CM

## 2021-02-22 DIAGNOSIS — M19072 Primary osteoarthritis, left ankle and foot: Secondary | ICD-10-CM

## 2021-02-22 DIAGNOSIS — N200 Calculus of kidney: Secondary | ICD-10-CM

## 2021-02-22 DIAGNOSIS — M170 Bilateral primary osteoarthritis of knee: Secondary | ICD-10-CM | POA: Diagnosis not present

## 2021-02-22 DIAGNOSIS — M25511 Pain in right shoulder: Secondary | ICD-10-CM | POA: Diagnosis not present

## 2021-02-22 DIAGNOSIS — S52531D Colles' fracture of right radius, subsequent encounter for closed fracture with routine healing: Secondary | ICD-10-CM

## 2021-02-22 DIAGNOSIS — M5136 Other intervertebral disc degeneration, lumbar region: Secondary | ICD-10-CM

## 2021-02-22 DIAGNOSIS — R5383 Other fatigue: Secondary | ICD-10-CM

## 2021-02-22 DIAGNOSIS — Z8261 Family history of arthritis: Secondary | ICD-10-CM

## 2021-02-22 MED ORDER — TRIAMCINOLONE ACETONIDE 40 MG/ML IJ SUSP
40.0000 mg | INTRAMUSCULAR | Status: AC | PRN
  Administered 2021-02-22: 40 mg via INTRA_ARTICULAR

## 2021-02-22 MED ORDER — LIDOCAINE HCL 1 % IJ SOLN
1.0000 mL | INTRAMUSCULAR | Status: AC | PRN
  Administered 2021-02-22: 1 mL

## 2021-02-22 NOTE — Telephone Encounter (Signed)
Pending lab results, patient will be starting arava, per Sherron Ales, PA-C. Thanks!   Consent obtained and sent to the scan center.

## 2021-02-22 NOTE — Progress Notes (Signed)
Pharmacy Note  Subjective: Patient presents today to the [facility] for follow up office visit with Sherron Ales, PA-C.  Patient seen by the pharmacist for counseling on leflunomide Ranae Plumber) for rheumatoid arthritis.  She currently takes hydroxychloroquine  200mg  twice daily. She has been hesitant to start leflunomide at prior visits since she states MTX killed her father and brother.  Objective: CBC    Component Value Date/Time   WBC 5.5 12/21/2020 1215   RBC 4.78 12/21/2020 1215   HGB 14.1 12/21/2020 1215   HCT 42.3 12/21/2020 1215   PLT 275 12/21/2020 1215   MCV 88.5 12/21/2020 1215   MCH 29.5 12/21/2020 1215   MCHC 33.3 12/21/2020 1215   RDW 12.4 12/21/2020 1215   LYMPHSABS 2,206 12/21/2020 1215   EOSABS 121 12/21/2020 1215   BASOSABS 72 12/21/2020 1215    CMP     Component Value Date/Time   NA 140 09/22/2020 0000   K 4.3 09/22/2020 0000   CL 106 09/22/2020 0000   CO2 26 09/22/2020 0000   GLUCOSE 86 09/22/2020 0000   BUN 15 09/22/2020 0000   CREATININE 0.74 09/22/2020 0000   CALCIUM 9.6 09/22/2020 0000   PROT 6.8 09/22/2020 0000   AST 24 09/22/2020 0000   ALT 17 09/22/2020 0000   BILITOT 0.4 09/22/2020 0000   GFRNONAA 89 09/22/2020 0000   GFRAA 103 09/22/2020 0000    Baseline Immunosuppressant Therapy Labs Quantiferon TB Gold Latest Ref Rng & Units 07/22/2019  Quantiferon TB Gold Plus NEGATIVE NEGATIVE    Hepatitis Latest Ref Rng & Units 07/22/2019  Hep B Surface Ag NON-REACTI NON-REACTIVE  Hep B IgM NON-REACTI NON-REACTIVE  Hep C Ab NON-REACTI NON-REACTIVE  Hep C Ab NON-REACTI NON-REACTIVE    Lab Results  Component Value Date   HIV NON-REACTIVE 07/22/2019    Immunoglobulin Electrophoresis Latest Ref Rng & Units 07/22/2019  IgA  47 - 310 mg/dL 13/12/2018  IgG 338 - 250 mg/dL 5,397  IgM 50 - 673 mg/dL 419)    Serum Protein Electrophoresis Latest Ref Rng & Units 09/22/2020  Total Protein 6.1 - 8.1 g/dL 6.8  Albumin 3.8 - 4.8 g/dL -  Alpha-1 0.2  - 0.3 g/dL -  Alpha-2 0.5 - 0.9 g/dL -  Beta Globulin 0.4 - 0.6 g/dL -  Beta 2 0.2 - 0.5 g/dL -  Gamma Globulin 0.8 - 1.7 g/dL -    Lab Results  Component Value Date   G6PDH 13.0 07/22/2019    Pregnancy status:  Post-menopausal  Alcohol use: rarely  Assessment/Plan:  Patient was counseled on the purpose, proper use, and adverse effects of leflunomide including risk of infection, nausea/diarrhea/weight loss, increase in blood pressure, rash, hair loss, tingling in the hands and feet, and signs and symptoms of interstitial lung disease.   Also counseled on Black Box warning of liver injury and importance of avoiding alcohol while on therapy. Discussed that there is the possibility of an increased risk of malignancy but it is not well understood if this increased risk is due to the medication or the disease state.  Counseled patient to avoid live vaccines.   Discussed the importance of frequent monitoring of liver function and blood count.  Standing orders placed.  Discussed importance of birth control while on leflunomide due to risk of congenital abnormalities, and patient confirms she is post-menopausal.  Provided patient with educational materials on leflunomide and answered all questions.  Patient consented to 13/12/2018 use, and consent will be uploaded into the media  tab.    Patient dose will be leflunomide 10mg  once daily x 2 weeks then repeat labs. If labs are stable, will increase dose to 20mg  once daily.  Prescription pending lab results.  She will continue hydroxychloroquine as prescribed.  , PharmD, MPH Clinical Pharmacist (Rheumatology and Pulmonology)

## 2021-02-22 NOTE — Patient Instructions (Signed)
Standing Labs We placed an order today for your standing lab work.   Please have your standing labs drawn in 2 weeks x2, 2 months, then every 3 months   If possible, please have your labs drawn 2 weeks prior to your appointment so that the provider can discuss your results at your appointment.  Please note that you may see your imaging and lab results in MyChart before we have reviewed them. We may be awaiting multiple results to interpret others before contacting you. Please allow our office up to 72 hours to thoroughly review all of the results before contacting the office for clarification of your results.  We have open lab daily: Monday through Thursday from 1:30-4:30 PM and Friday from 1:30-4:00 PM at the office of Dr. Pollyann Savoy, [facility].   Please be advised, all patients with office appointments requiring lab work will take precedent over walk-in lab work.  If possible, please come for your lab work on Monday and Friday afternoons, as you may experience shorter wait times. The office is located at [address], [place], [place] [address] No appointment is necessary.   Labs are drawn by [facility]. Please bring your co-pay at the time of your lab draw.  You may receive a bill from [facility] for your lab work.  If you wish to have your labs drawn at another location, please call the office 24 hours in advance to send orders.  If you have any questions regarding directions or hours of operation,  please call [phone].   As a reminder, please drink plenty of water prior to coming for your lab work. Thanks!  Leflunomide tablets What is this medicine? LEFLUNOMIDE (le FLOO na mide) is for rheumatoid arthritis. This medicine may be used for other purposes; ask your health care provider or pharmacist if you have questions. COMMON BRAND NAME(S): Arava What should I tell my health care provider before I take this medicine? They need to know if you have any  of these conditions:  diabetes  have a fever or infection  high blood pressure  immune system problems  kidney disease  liver disease  low blood cell counts, like low white cell, platelet, or red cell counts  lung or breathing disease, like asthma  recently received or scheduled to receive a vaccine  receiving treatment for cancer  skin conditions or sensitivity  tingling of the fingers or toes, or other nerve disorder  tuberculosis  an unusual or allergic reaction to leflunomide, teriflunomide, other medicines, food, dyes, or preservatives  pregnant or trying to get pregnant  breast-feeding How should I use this medicine? Take this medicine by mouth with a full glass of water. Follow the directions on the prescription label. Take your medicine at regular intervals. Do not take your medicine more often than directed. Do not stop taking except on your doctor's advice. Talk to your pediatrician regarding the use of this medicine in children. Special care may be needed. Overdosage: If you think you have taken too much of this medicine contact a poison control center or emergency room at once. NOTE: This medicine is only for you. Do not share this medicine with others. What if I miss a dose? If you miss a dose, take it as soon as you can. If it is almost time for your next dose, take only that dose. Do not take double or extra doses. What may interact with this medicine? Do not take this medicine with any of the  following medications:  teriflunomide This medicine may also interact with the following medications:  alosetron  birth control pills  caffeine  cefaclor  certain medicines for diabetes like nateglinide, repaglinide, rosiglitazone, pioglitazone  certain medicines for high cholesterol like atorvastatin, pravastatin, rosuvastatin, simvastatin  charcoal  cholestyramine  ciprofloxacin  duloxetine  furosemide  ketoprofen  live virus  vaccines  medicines that increase your risk for infection  methotrexate  mitoxantrone  paclitaxel  penicillin  theophylline  tizanidine  warfarin This list may not describe all possible interactions. Give your health care provider a list of all the medicines, herbs, non-prescription drugs, or dietary supplements you use. Also tell them if you smoke, drink alcohol, or use illegal drugs. Some items may interact with your medicine. What should I watch for while using this medicine? Visit your health care provider for regular checks on your progress. Tell your doctor or health care provider if your symptoms do not start to get better or if they get worse. You may need blood work done while you are taking this medicine. This medicine may cause serious skin reactions. They can happen weeks to months after starting the medicine. Contact your health care provider right away if you notice fevers or flu-like symptoms with a rash. The rash may be red or purple and then turn into blisters or peeling of the skin. Or, you might notice a red rash with swelling of the face, lips or lymph nodes in your neck or under your arms. This medicine may stay in your body for up to 2 years after your last dose. Tell your doctor about any unusual side effects or symptoms. A medicine can be given to help lower your blood levels of this medicine more quickly. Women must use effective birth control with this medicine. There is a potential for serious side effects to an unborn child. Do not become pregnant while taking this medicine. Inform your doctor if you wish to become pregnant. This medicine remains in your blood after you stop taking it. You must continue using effective birth control until the blood levels have been checked and they are low enough. A medicine can be given to help lower your blood levels of this medicine more quickly. Immediately talk to your doctor if you think you may be pregnant. You may need a  pregnancy test. Talk to your health care provider or pharmacist for more information. You should not receive certain vaccines during your treatment and for a certain time after your treatment with this medication ends. Talk to your health care provider for more information. What side effects may I notice from receiving this medicine? Side effects that you should report to your doctor or health care professional as soon as possible:  allergic reactions like skin rash, itching or hives, swelling of the face, lips, or tongue  breathing problems  cough  increased blood pressure  low blood counts - this medicine may decrease the number of white blood cells and platelets. You may be at increased risk for infections and bleeding.  pain, tingling, numbness in the hands or feet  rash, fever, and swollen lymph nodes  redness, blistering, peeing or loosening of the skin, including inside the mouth  signs of decreased platelets or bleeding - bruising, pinpoint red spots on the skin, black, tarry stools, blood in urine  signs of infection - fever or chills, cough, sore throat, pain or trouble passing urine  signs and symptoms of liver injury like dark yellow or brown urine;  general ill feeling or flu-like symptoms; light-colored stools; loss of appetite; nausea; right upper belly pain; unusually weak or tired; yellowing of the eyes or skin  trouble passing urine or change in the amount of urine  vomiting Side effects that usually do not require medical attention (report to your doctor or health care professional if they continue or are bothersome):  diarrhea  hair thinning or loss  headache  nausea  tiredness This list may not describe all possible side effects. Call your doctor for medical advice about side effects. You may report side effects to FDA at 1-800-FDA-1088. Where should I keep my medicine? Keep out of the reach of children. Store at room temperature between 15 and 30  degrees C (59 and 86 degrees F). Protect from moisture and light. Throw away any unused medicine after the expiration date. NOTE: This sheet is a summary. It may not cover all possible information. If you have questions about this medicine, talk to your doctor, pharmacist, or health care provider.  2021 Elsevier/Gold Standard (2018-12-05 15:06:48)   Shoulder Exercises Ask your health care provider which exercises are safe for you. Do exercises exactly as told by your health care provider and adjust them as directed. It is normal to feel mild stretching, pulling, tightness, or discomfort as you do these exercises. Stop right away if you feel sudden pain or your pain gets worse. Do not begin these exercises until told by your health care provider. Stretching exercises External rotation and abduction This exercise is sometimes called corner stretch. This exercise rotates your arm outward (external rotation) and moves your arm out from your body (abduction). 1. Stand in a doorway with one of your feet slightly in front of the other. This is called a staggered stance. If you cannot reach your forearms to the door frame, stand facing a corner of a room. 2. Choose one of the following positions as told by your health care provider: ? Place your hands and forearms on the door frame above your head. ? Place your hands and forearms on the door frame at the height of your head. ? Place your hands on the door frame at the height of your elbows. 3. Slowly move your weight onto your front foot until you feel a stretch across your chest and in the front of your shoulders. Keep your head and chest upright and keep your abdominal muscles tight. 4. Hold for __________ seconds. 5. To release the stretch, shift your weight to your back foot. Repeat __________ times. Complete this exercise __________ times a day.   Extension, standing 1. Stand and hold a broomstick, a cane, or a similar object behind your  back. ? Your hands should be a little wider than shoulder width apart. ? Your palms should face away from your back. 2. Keeping your elbows straight and your shoulder muscles relaxed, move the stick away from your body until you feel a stretch in your shoulders (extension). ? Avoid shrugging your shoulders while you move the stick. Keep your shoulder blades tucked down toward the middle of your back. 3. Hold for __________ seconds. 4. Slowly return to the starting position. Repeat __________ times. Complete this exercise __________ times a day. Range-of-motion exercises Pendulum 1. Stand near a wall or a surface that you can hold onto for balance. 2. Bend at the waist and let your left / right arm hang straight down. Use your other arm to support you. Keep your back straight and do not lock your  knees. 3. Relax your left / right arm and shoulder muscles, and move your hips and your trunk so your left / right arm swings freely. Your arm should swing because of the motion of your body, not because you are using your arm or shoulder muscles. 4. Keep moving your hips and trunk so your arm swings in the following directions, as told by your health care provider: ? Side to side. ? Forward and backward. ? In clockwise and counterclockwise circles. 5. Continue each motion for __________ seconds, or for as long as told by your health care provider. 6. Slowly return to the starting position. Repeat __________ times. Complete this exercise __________ times a day.   Shoulder flexion, standing 1. Stand and hold a broomstick, a cane, or a similar object. Place your hands a little more than shoulder width apart on the object. Your left / right hand should be palm up, and your other hand should be palm down. 2. Keep your elbow straight and your shoulder muscles relaxed. Push the stick up with your healthy arm to raise your left / right arm in front of your body, and then over your head until you feel a stretch  in your shoulder (flexion). ? Avoid shrugging your shoulder while you raise your arm. Keep your shoulder blade tucked down toward the middle of your back. 3. Hold for __________ seconds. 4. Slowly return to the starting position. Repeat __________ times. Complete this exercise __________ times a day.   Shoulder abduction, standing 1. Stand and hold a broomstick, a cane, or a similar object. Place your hands a little more than shoulder width apart on the object. Your left / right hand should be palm up, and your other hand should be palm down. 2. Keep your elbow straight and your shoulder muscles relaxed. Push the object across your body toward your left / right side. Raise your left / right arm to the side of your body (abduction) until you feel a stretch in your shoulder. ? Do not raise your arm above shoulder height unless your health care provider tells you to do that. ? If directed, raise your arm over your head. ? Avoid shrugging your shoulder while you raise your arm. Keep your shoulder blade tucked down toward the middle of your back. 3. Hold for __________ seconds. 4. Slowly return to the starting position. Repeat __________ times. Complete this exercise __________ times a day. Internal rotation 1. Place your left / right hand behind your back, palm up. 2. Use your other hand to dangle an exercise band, a towel, or a similar object over your shoulder. Grasp the band with your left / right hand so you are holding on to both ends. 3. Gently pull up on the band until you feel a stretch in the front of your left / right shoulder. The movement of your arm toward the center of your body is called internal rotation. ? Avoid shrugging your shoulder while you raise your arm. Keep your shoulder blade tucked down toward the middle of your back. 4. Hold for __________ seconds. 5. Release the stretch by letting go of the band and lowering your hands. Repeat __________ times. Complete this exercise  __________ times a day.   Strengthening exercises External rotation 1. Sit in a stable chair without armrests. 2. Secure an exercise band to a stable object at elbow height on your left / right side. 3. Place a soft object, such as a folded towel or a small pillow,  between your left / right upper arm and your body to move your elbow about 4 inches (10 cm) away from your side. 4. Hold the end of the exercise band so it is tight and there is no slack. 5. Keeping your elbow pressed against the soft object, slowly move your forearm out, away from your abdomen (external rotation). Keep your body steady so only your forearm moves. 6. Hold for __________ seconds. 7. Slowly return to the starting position. Repeat __________ times. Complete this exercise __________ times a day.   Shoulder abduction 1. Sit in a stable chair without armrests, or stand up. 2. Hold a __________ weight in your left / right hand, or hold an exercise band with both hands. 3. Start with your arms straight down and your left / right palm facing in, toward your body. 4. Slowly lift your left / right hand out to your side (abduction). Do not lift your hand above shoulder height unless your health care provider tells you that this is safe. ? Keep your arms straight. ? Avoid shrugging your shoulder while you do this movement. Keep your shoulder blade tucked down toward the middle of your back. 5. Hold for __________ seconds. 6. Slowly lower your arm, and return to the starting position. Repeat __________ times. Complete this exercise __________ times a day.   Shoulder extension 1. Sit in a stable chair without armrests, or stand up. 2. Secure an exercise band to a stable object in front of you so it is at shoulder height. 3. Hold one end of the exercise band in each hand. Your palms should face each other. 4. Straighten your elbows and lift your hands up to shoulder height. 5. Step back, away from the secured end of the exercise  band, until the band is tight and there is no slack. 6. Squeeze your shoulder blades together as you pull your hands down to the sides of your thighs (extension). Stop when your hands are straight down by your sides. Do not let your hands go behind your body. 7. Hold for __________ seconds. 8. Slowly return to the starting position. Repeat __________ times. Complete this exercise __________ times a day. Shoulder row 1. Sit in a stable chair without armrests, or stand up. 2. Secure an exercise band to a stable object in front of you so it is at waist height. 3. Hold one end of the exercise band in each hand. Position your palms so that your thumbs are facing the ceiling (neutral position). 4. Bend each of your elbows to a 90-degree angle (right angle) and keep your upper arms at your sides. 5. Step back until the band is tight and there is no slack. 6. Slowly pull your elbows back behind you. 7. Hold for __________ seconds. 8. Slowly return to the starting position. Repeat __________ times. Complete this exercise __________ times a day. Shoulder press-ups 1. Sit in a stable chair that has armrests. Sit upright, with your feet flat on the floor. 2. Put your hands on the armrests so your elbows are bent and your fingers are pointing forward. Your hands should be about even with the sides of your body. 3. Push down on the armrests and use your arms to lift yourself off the chair. Straighten your elbows and lift yourself up as much as you comfortably can. ? Move your shoulder blades down, and avoid letting your shoulders move up toward your ears. ? Keep your feet on the ground. As you get stronger, your  feet should support less of your body weight as you lift yourself up. 4. Hold for __________ seconds. 5. Slowly lower yourself back into the chair. Repeat __________ times. Complete this exercise __________ times a day.   Wall push-ups 1. Stand so you are facing a stable wall. Your feet should be  about one arm-length away from the wall. 2. Lean forward and place your palms on the wall at shoulder height. 3. Keep your feet flat on the floor as you bend your elbows and lean forward toward the wall. 4. Hold for __________ seconds. 5. Straighten your elbows to push yourself back to the starting position. Repeat __________ times. Complete this exercise __________ times a day.   This information is not intended to replace advice given to you by your health care provider. Make sure you discuss any questions you have with your health care provider. Document Revised: 12/26/2018 Document Reviewed: 10/03/2018 Elsevier Patient Education  2021 [employer].

## 2021-02-23 ENCOUNTER — Ambulatory Visit: Payer: Managed Care, Other (non HMO) | Admitting: Rheumatology

## 2021-02-23 DIAGNOSIS — M170 Bilateral primary osteoarthritis of knee: Secondary | ICD-10-CM

## 2021-02-23 DIAGNOSIS — M19041 Primary osteoarthritis, right hand: Secondary | ICD-10-CM

## 2021-02-23 DIAGNOSIS — N200 Calculus of kidney: Secondary | ICD-10-CM

## 2021-02-23 DIAGNOSIS — S52531D Colles' fracture of right radius, subsequent encounter for closed fracture with routine healing: Secondary | ICD-10-CM

## 2021-02-23 DIAGNOSIS — M0579 Rheumatoid arthritis with rheumatoid factor of multiple sites without organ or systems involvement: Secondary | ICD-10-CM

## 2021-02-23 DIAGNOSIS — M5136 Other intervertebral disc degeneration, lumbar region: Secondary | ICD-10-CM

## 2021-02-23 DIAGNOSIS — Z79899 Other long term (current) drug therapy: Secondary | ICD-10-CM

## 2021-02-23 DIAGNOSIS — M19071 Primary osteoarthritis, right ankle and foot: Secondary | ICD-10-CM

## 2021-02-23 DIAGNOSIS — Z8261 Family history of arthritis: Secondary | ICD-10-CM

## 2021-02-23 LAB — CBC WITH DIFFERENTIAL/PLATELET
Absolute Monocytes: 745 cells/uL (ref 200–950)
Basophils Absolute: 84 cells/uL (ref 0–200)
Basophils Relative: 1.1 %
Eosinophils Absolute: 122 cells/uL (ref 15–500)
Eosinophils Relative: 1.6 %
HCT: 45.9 % — ABNORMAL HIGH (ref 35.0–45.0)
Hemoglobin: 14.9 g/dL (ref 11.7–15.5)
Lymphs Abs: 1809 cells/uL (ref 850–3900)
MCH: 28.8 pg (ref 27.0–33.0)
MCHC: 32.5 g/dL (ref 32.0–36.0)
MCV: 88.6 fL (ref 80.0–100.0)
MPV: 10.6 fL (ref 7.5–12.5)
Monocytes Relative: 9.8 %
Neutro Abs: 4841 cells/uL (ref 1500–7800)
Neutrophils Relative %: 63.7 %
Platelets: 338 10*3/uL (ref 140–400)
RBC: 5.18 10*6/uL — ABNORMAL HIGH (ref 3.80–5.10)
RDW: 12.4 % (ref 11.0–15.0)
Total Lymphocyte: 23.8 %
WBC: 7.6 10*3/uL (ref 3.8–10.8)

## 2021-02-23 LAB — COMPLETE METABOLIC PANEL WITH GFR
AG Ratio: 1.7 (calc) (ref 1.0–2.5)
ALT: 20 U/L (ref 6–29)
AST: 26 U/L (ref 10–35)
Albumin: 4.6 g/dL (ref 3.6–5.1)
Alkaline phosphatase (APISO): 107 U/L (ref 37–153)
BUN: 16 mg/dL (ref 7–25)
CO2: 28 mmol/L (ref 20–32)
Calcium: 9.7 mg/dL (ref 8.6–10.4)
Chloride: 103 mmol/L (ref 98–110)
Creat: 0.94 mg/dL (ref 0.50–1.05)
GFR, Est African American: 77 mL/min/{1.73_m2} (ref >=60)
GFR, Est Non African American: 66 mL/min/{1.73_m2} (ref >=60)
Globulin: 2.7 g/dL (calc) (ref 1.9–3.7)
Glucose, Bld: 114 mg/dL — ABNORMAL HIGH (ref 65–99)
Potassium: 4.4 mmol/L (ref 3.5–5.3)
Sodium: 139 mmol/L (ref 135–146)
Total Bilirubin: 0.7 mg/dL (ref 0.2–1.2)
Total Protein: 7.3 g/dL (ref 6.1–8.1)

## 2021-02-23 MED ORDER — LEFLUNOMIDE 10 MG PO TABS: ORAL_TABLET | ORAL | 0 refills | Status: DC

## 2021-02-23 NOTE — Progress Notes (Signed)
Glucose was 114.  Rest of CMP WNL.  RBC count and hematocrit are borderline elevated.  Rest of CBC WNL.    Ok to start on Arava 10 mg 1 tablet by mouth daily. She should return in 2 weeks to recheck CBC and CMP prior to increasing to 20 mg daily.

## 2021-02-23 NOTE — Telephone Encounter (Signed)
Per lab note: Glucose was 114.  Rest of CMP WNL.  RBC count and hematocrit are borderlineelevated.  Rest of CBC WNL.     Ok to start on Arava 10 mg 1 tablet by mouth daily. She should return in 2weeks to recheck CBC and CMP prior to increasing to 20 mg daily.

## 2021-03-13 ENCOUNTER — Other Ambulatory Visit: Payer: Self-pay | Admitting: Physician Assistant

## 2021-03-13 DIAGNOSIS — M0579 Rheumatoid arthritis with rheumatoid factor of multiple sites without organ or systems involvement: Secondary | ICD-10-CM

## 2021-03-15 ENCOUNTER — Other Ambulatory Visit: Payer: Self-pay | Admitting: *Deleted

## 2021-03-15 DIAGNOSIS — M0579 Rheumatoid arthritis with rheumatoid factor of multiple sites without organ or systems involvement: Secondary | ICD-10-CM

## 2021-03-15 DIAGNOSIS — Z79899 Other long term (current) drug therapy: Secondary | ICD-10-CM

## 2021-03-16 ENCOUNTER — Telehealth: Payer: Self-pay | Admitting: *Deleted

## 2021-03-16 LAB — COMPLETE METABOLIC PANEL WITH GFR
AG Ratio: 1.7 (calc) (ref 1.0–2.5)
ALT: 16 U/L (ref 6–29)
AST: 22 U/L (ref 10–35)
Albumin: 4.1 g/dL (ref 3.6–5.1)
Alkaline phosphatase (APISO): 92 U/L (ref 37–153)
BUN: 14 mg/dL (ref 7–25)
CO2: 29 mmol/L (ref 20–32)
Calcium: 9.3 mg/dL (ref 8.6–10.4)
Chloride: 104 mmol/L (ref 98–110)
Creat: 0.85 mg/dL (ref 0.50–1.05)
GFR, Est African American: 87 mL/min/{1.73_m2} (ref >=60)
GFR, Est Non African American: 75 mL/min/{1.73_m2} (ref >=60)
Globulin: 2.4 g/dL (calc) (ref 1.9–3.7)
Glucose, Bld: 75 mg/dL (ref 65–99)
Potassium: 3.7 mmol/L (ref 3.5–5.3)
Sodium: 142 mmol/L (ref 135–146)
Total Bilirubin: 0.8 mg/dL (ref 0.2–1.2)
Total Protein: 6.5 g/dL (ref 6.1–8.1)

## 2021-03-16 LAB — CBC WITH DIFFERENTIAL/PLATELET
Absolute Monocytes: 396 cells/uL (ref 200–950)
Basophils Absolute: 50 cells/uL (ref 0–200)
Basophils Relative: 0.7 %
Eosinophils Absolute: 108 cells/uL (ref 15–500)
Eosinophils Relative: 1.5 %
HCT: 43.3 % (ref 35.0–45.0)
Hemoglobin: 14.2 g/dL (ref 11.7–15.5)
Lymphs Abs: 842 cells/uL — ABNORMAL LOW (ref 850–3900)
MCH: 29.3 pg (ref 27.0–33.0)
MCHC: 32.8 g/dL (ref 32.0–36.0)
MCV: 89.5 fL (ref 80.0–100.0)
MPV: 10.6 fL (ref 7.5–12.5)
Monocytes Relative: 5.5 %
Neutro Abs: 5803 cells/uL (ref 1500–7800)
Neutrophils Relative %: 80.6 %
Platelets: 237 10*3/uL (ref 140–400)
RBC: 4.84 10*6/uL (ref 3.80–5.10)
RDW: 12.4 % (ref 11.0–15.0)
Total Lymphocyte: 11.7 %
WBC: 7.2 10*3/uL (ref 3.8–10.8)

## 2021-03-16 NOTE — Progress Notes (Signed)
CMP is normal.  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  We will continue to monitor labs.

## 2021-03-16 NOTE — Telephone Encounter (Signed)
Patient tested positive for COVID, patient advised to hold Arava until1 week after symptoms resolve. Patient verbalized understanding.

## 2021-03-22 ENCOUNTER — Other Ambulatory Visit: Payer: Self-pay | Admitting: Physician Assistant

## 2021-03-22 NOTE — Telephone Encounter (Signed)
Next Visit: 07/27/2021  Last Visit: 02/22/2021  Last Fill: 02/23/2021  DX:  Rheumatoid arthritis involving multiple sites with positive rheumatoid factor  Current Dose per office note 02/22/2021: start on arava 10 mg 1 tablet daily x2 weeks and if labs are stable she will increase to 20 mg daily  Labs: 03/15/2021, CMP is normal.  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  Wewill continue to monitor labs.  Okay to refill Arava?

## 2021-03-30 ENCOUNTER — Other Ambulatory Visit: Payer: Self-pay | Admitting: Physician Assistant

## 2021-03-30 DIAGNOSIS — M0579 Rheumatoid arthritis with rheumatoid factor of multiple sites without organ or systems involvement: Secondary | ICD-10-CM

## 2021-06-25 ENCOUNTER — Other Ambulatory Visit: Payer: Self-pay | Admitting: Physician Assistant

## 2021-06-26 NOTE — Telephone Encounter (Signed)
Next Visit: 07/27/2021   Last Visit: 02/22/2021   Last Fill: 03/22/2021   DX:  Rheumatoid arthritis involving multiple sites with positive rheumatoid factor   Current Dose per office note 02/22/2021: start on arava 10 mg 1 tablet daily x2 weeks and if labs are stable she will increase to 20 mg daily   Labs: 03/15/2021, CMP is normal.  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  Wewill continue to monitor labs.  Attempted to contact the patient to advise she is du to update labs. Unable to leave a voicemail, mailbox is full.    Okay to refill Arava?

## 2021-07-10 ENCOUNTER — Other Ambulatory Visit: Payer: Self-pay | Admitting: Rheumatology

## 2021-07-13 NOTE — Progress Notes (Signed)
Office Visit Note  Patient: Courtney Kramer             Date of Birth: [birth year]           MRN: [id number]             PCP: Dema Severin, NP Referring: Dema Severin, NP Visit Date: 07/27/2021 Occupation: @GUAROCC @  Subjective:  Pain and swelling in both hands.   History of Present Illness: Courtney Kramer is a 60 y.o. female with a history of rheumatoid arthritis and osteoarthritis.  She states she tried leflunomide for 4 months and discontinued at that she felt it was not effective.  She felt that Celebrex works better hence she went back to Celebrex which she has been taking 200 mg p.o. daily.  Hydroxychloroquine has been effective when she has been taking hydroxychloroquine 200 mg twice daily Monday to Friday.  She continues to have pain and swelling in her hands.  No other joints are painful.  Activities of Daily Living:  Patient reports morning stiffness for 10 minutes.   Patient Denies nocturnal pain.  Difficulty dressing/grooming: Denies Difficulty climbing stairs: Denies Difficulty getting out of chair: Denies Difficulty using hands for taps, buttons, cutlery, and/or writing: Denies  Review of Systems  Constitutional:  Negative for fatigue.  HENT:  Negative for mouth sores, mouth dryness and nose dryness.   Eyes:  Positive for dryness. Negative for pain and itching.  Respiratory:  Negative for shortness of breath and difficulty breathing.   Cardiovascular:  Negative for chest pain and palpitations.  Gastrointestinal:  Negative for blood in stool, constipation and diarrhea.  Endocrine: Negative for increased urination.  Genitourinary:  Negative for difficulty urinating.  Musculoskeletal:  Positive for joint pain, joint pain, joint swelling, myalgias, morning stiffness, muscle tenderness and myalgias.  Skin:  Negative for color change, rash, redness and sensitivity to sunlight.  Allergic/Immunologic: Negative for susceptible to infections.  Neurological:  Positive  for numbness and headaches. Negative for dizziness, memory loss and weakness.  Hematological:  Negative for bruising/bleeding tendency.  Psychiatric/Behavioral:  Negative for depressed mood, confusion and sleep disturbance. The patient is not nervous/anxious.    PMFS History:  Patient Active Problem List   Diagnosis Date Noted   Rheumatoid arthritis involving multiple sites with positive rheumatoid factor (HCC) 04/21/2020   High risk medication use 04/21/2020   Primary osteoarthritis of both hands 04/21/2020   Primary osteoarthritis of both knees 04/21/2020   Primary osteoarthritis of both feet 04/21/2020   DDD (degenerative disc disease), lumbar 04/21/2020   Fracture, Colles, right, closed 04/09/2018   Closed fracture of right distal radius and ulna, initial encounter 03/27/2018   Lumbar radiculopathy 09/19/2016   RENAL CALCULUS 05/04/2008   FLANK PAIN, RIGHT 05/04/2008    Past Medical History:  Diagnosis Date   Anxiety    Chronic back pain    Chronic back pain    GERD (gastroesophageal reflux disease)    History of cardiac murmur    07-26-2020  per pt happen after last pregnancy, work-up done was ok, and was told few years pcp no longer hears it   History of kidney stones    Kidney stones    OA (osteoarthritis)    hands , knees, feet   RA (rheumatoid arthritis) (HCC)    rheumotology--- Dr 13-05-2020. Courtney Kramer--- multiple sites seropositive/ positive rheumotoid factor,  taking plaquinel   Right ureteral calculus    Wears glasses     Family History  Problem Relation  Age of Onset   Fibromyalgia Mother    Heart disease Mother    Macular degeneration Mother    Rheum arthritis Father    Rheum arthritis Sister    Cancer Brother    Breast cancer Neg Hx    Past Surgical History:  Procedure Laterality Date   COLONOSCOPY  2012   CYSTO/  RIGHT URETERAL STENT PLACEMENT  05-10-2008   CYSTOSCOPY W/ URETERAL STENT PLACEMENT Right 06/09/2014   Procedure: CYSTOSCOPY WITH RETROGRADE  PYELOGRAM/URETERAL STENT PLACEMENT;  Surgeon: Courtney Fuller, MD;  Location: [facility];  Service: Urology;  Laterality: Right;   CYSTOSCOPY W/ URETERAL STENT PLACEMENT Left 02/17/2020   Procedure: CYSTOSCOPY WITH RETROGRADE PYELOGRAM/URETERAL STENT PLACEMENT;  Surgeon: Courtney Ache, MD;  Location: [facility];  Service: Urology;  Laterality: Left;  45 MINS   CYSTOSCOPY/URETEROSCOPY/HOLMIUM LASER/STENT PLACEMENT Left 02/22/2020   Procedure: CYSTOSCOPY/LEFT URETEROSCOPY/HOLMIUM LASER/STENT EXCHANGE;  Surgeon: Courtney Pippin, MD;  Location: [facility];  Service: Urology;  Laterality: Left;   CYSTOSCOPY/URETEROSCOPY/HOLMIUM LASER/STENT PLACEMENT Right 07/28/2020   Procedure: CYSTOSCOPY RIGHT URETEROSCOPY RIGHT RETROGRADE PYELOGRAM HOLMIUM LASER/STENT PLACEMENT;  Surgeon: Courtney Pippin, MD;  Location: [facility];  Service: Urology;  Laterality: Right;   EXTRACORPOREAL SHOCK WAVE LITHOTRIPSY Right 05-10-2008   KNEE ARTHROSCOPY Left 2009   OPEN REDUCTION INTERNAL FIXATION (ORIF) DISTAL RADIAL FRACTURE Right 03/27/2018   Procedure: OPEN REDUCTION INTERNAL FIXATION (ORIF) RIGHT DISTAL RADIUS FRACTURE;  Surgeon: Courtney Kos, MD;  Location: [facility];  Service: Orthopedics;  Laterality: Right;   WISDOM TOOTH EXTRACTION  AGE 10   Social History   Social History Narrative   Not on file    There is no immunization history on file for this patient.   Objective: Vital Signs: BP (!) 151/99 (BP Location: Left Arm, Patient Position: Sitting, Cuff Size: Normal)   Pulse 69   Ht 5\' 6"  (1.676 m)   Wt 192 lb 6.4 oz (87.3 kg)   BMI 31.05 kg/m    Physical Exam Vitals and nursing note reviewed.  Constitutional:      Appearance: She is well-developed.  HENT:     Head: Normocephalic and atraumatic.  Eyes:     Conjunctiva/sclera: Conjunctivae normal.  Cardiovascular:     Rate and Rhythm: Normal rate and regular rhythm.     Heart sounds: Normal heart sounds.  Pulmonary:      Effort: Pulmonary effort is normal.     Breath sounds: Normal breath sounds.  Abdominal:     General: Bowel sounds are normal.     Palpations: Abdomen is soft.  Musculoskeletal:     Cervical back: Normal range of motion.  Lymphadenopathy:     Cervical: No cervical adenopathy.  Skin:    General: Skin is warm and dry.     Capillary Refill: Capillary refill takes less than 2 seconds.  Neurological:     Mental Status: She is alert and oriented to person, place, and time.  Psychiatric:        Behavior: Behavior normal.     Musculoskeletal Exam: C-spine was in good range of motion.  Shoulder joints, elbow joints, wrist joints, MCPs with good range of motion with no synovitis.  She had bilateral PIP and DIP thickening with decreased range of motion of PIP and DIP joints.  Right third PIP thickening with no synovitis was noted.  Hip joints and knee joints with good range of motion.  She had no tenderness over ankles or MTPs.  No synovitis was  noted.  CDAI Exam: CDAI Score: 0.6  Patient Global: 5 mm; Provider Global: 1 mm Swollen: 0 ; Tender: 0  Joint Exam 07/27/2021   No joint exam has been documented for this visit   There is currently no information documented on the homunculus. Go to the Rheumatology activity and complete the homunculus joint exam.  Investigation: No additional findings.  Imaging: No results found.  Recent Labs: Lab Results  Component Value Date   WBC 7.2 03/15/2021   HGB 14.2 03/15/2021   PLT 237 03/15/2021   NA 142 03/15/2021   K 3.7 03/15/2021   CL 104 03/15/2021   CO2 29 03/15/2021   GLUCOSE 75 03/15/2021   BUN 14 03/15/2021   CREATININE 0.85 03/15/2021   BILITOT 0.8 03/15/2021   AST 22 03/15/2021   ALT 16 03/15/2021   PROT 6.5 03/15/2021   CALCIUM 9.3 03/15/2021   GFRAA 87 03/15/2021   QFTBGOLDPLUS NEGATIVE 07/22/2019    Speciality Comments: PLQ eye exam: 10/20/2020 WNL. [facility] f/u 1 year. Arava- no reponse dcd after 4  months  Procedures:  No procedures performed Allergies: Sulfa antibiotics   Assessment / Plan:     Visit Diagnoses: Rheumatoid arthritis involving multiple sites with positive rheumatoid factor (HCC) - Positive rheumatoid factor, negative CCP: She is clinically doing well on hydroxychloroquine 1 tablet by mouth twice a day.  She had no synovitis on examination.  She tried leflunomide 10 mg p.o. daily for 4 months and stopped it as she did not notice any benefit from it.  She states Celebrex is more effective in controlling her symptoms.  Her pain is mostly coming from underlying osteoarthritis.  High risk medication use - Plaquenil 200 mg 1 tablet by mouth twice daily (started mid-November 2020). PLQ eye exam: 10/20/2020.Arava stopped after 4 months-no response.  She has been taking Celebrex 200 mg p.o. daily.- Plan: CBC with Differential/Platelet, COMPLETE METABOLIC PANEL WITH GFR today and every 5 months.  Her last eye examination was February 2022.  Information regarding realization was placed in the AVS.  She was advised to be day examination in February 2023.  Primary osteoarthritis of both hands-she has severe osteoarthritis in her bilateral hands with decreased strength.  Joint protection muscle strengthening was discussed.  Primary osteoarthritis of both knees-she continues to have some discomfort in her knee joints.  No warmth swelling or effusion was noted.  Primary osteoarthritis of both feet-proper fitting shoes were discussed.  DDD (degenerative disc disease), lumbar-she has chronic lower back pain.  Elevated blood pressure reading-advised to monitor blood pressure closely.  If it stays elevated she may follow-up with her PCP.  Increased risk of heart disease with rheumatoid arthritis was discussed.  Dietary modifications and exercise was discussed.  A handout was placed in the AVS.  RENAL CALCULUS  Closed Colles' fracture of right radius with routine healing, subsequent  encounter  Other fatigue  Family history of rheumatoid arthritis - Father and 2 of her sisters  Orders: Orders Placed This Encounter  Procedures   CBC with Differential/Platelet   COMPLETE METABOLIC PANEL WITH GFR    No orders of the defined types were placed in this encounter.    Follow-Up Instructions: Return in about 5 months (around 12/25/2021) for Rheumatoid arthritis, Osteoarthritis.   Pollyann Savoy, MD  Note - This record has been created using Animal nutritionist.  Chart creation errors have been sought, but may not always  have been located. Such creation errors do not reflect on  the standard of medical care.

## 2021-07-27 ENCOUNTER — Ambulatory Visit (INDEPENDENT_AMBULATORY_CARE_PROVIDER_SITE_OTHER): Payer: 59 | Admitting: Rheumatology

## 2021-07-27 ENCOUNTER — Other Ambulatory Visit: Payer: Self-pay

## 2021-07-27 ENCOUNTER — Encounter: Payer: Self-pay | Admitting: Rheumatology

## 2021-07-27 VITALS — BP 151/99 | HR 69 | Ht 66.0 in | Wt 192.4 lb

## 2021-07-27 DIAGNOSIS — M19041 Primary osteoarthritis, right hand: Secondary | ICD-10-CM

## 2021-07-27 DIAGNOSIS — Z8261 Family history of arthritis: Secondary | ICD-10-CM

## 2021-07-27 DIAGNOSIS — S52531D Colles' fracture of right radius, subsequent encounter for closed fracture with routine healing: Secondary | ICD-10-CM

## 2021-07-27 DIAGNOSIS — M170 Bilateral primary osteoarthritis of knee: Secondary | ICD-10-CM

## 2021-07-27 DIAGNOSIS — M0579 Rheumatoid arthritis with rheumatoid factor of multiple sites without organ or systems involvement: Secondary | ICD-10-CM

## 2021-07-27 DIAGNOSIS — N200 Calculus of kidney: Secondary | ICD-10-CM

## 2021-07-27 DIAGNOSIS — M5136 Other intervertebral disc degeneration, lumbar region: Secondary | ICD-10-CM

## 2021-07-27 DIAGNOSIS — R5383 Other fatigue: Secondary | ICD-10-CM

## 2021-07-27 DIAGNOSIS — Z79899 Other long term (current) drug therapy: Secondary | ICD-10-CM | POA: Diagnosis not present

## 2021-07-27 DIAGNOSIS — M19042 Primary osteoarthritis, left hand: Secondary | ICD-10-CM

## 2021-07-27 DIAGNOSIS — R030 Elevated blood-pressure reading, without diagnosis of hypertension: Secondary | ICD-10-CM

## 2021-07-27 DIAGNOSIS — M19072 Primary osteoarthritis, left ankle and foot: Secondary | ICD-10-CM

## 2021-07-27 DIAGNOSIS — M19071 Primary osteoarthritis, right ankle and foot: Secondary | ICD-10-CM

## 2021-07-27 NOTE — Patient Instructions (Signed)
Vaccines You are taking a medication(s) that can suppress your immune system.  The following immunizations are recommended: Flu annually Covid-19  Td/Tdap (tetanus, diphtheria, pertussis) every 10 years Pneumonia (Prevnar 15 then Pneumovax 23 at least 1 year apart.  Alternatively, can take Prevnar 20 without needing additional dose) Shingrix: 2 doses from 4 weeks to 6 months apart  Please check with your PCP to make sure you are up to date.   Heart Disease Prevention   Your inflammatory disease increases your risk of heart disease which includes heart attack, stroke, atrial fibrillation (irregular heartbeats), high blood pressure, heart failure and atherosclerosis (plaque in the arteries).  It is important to reduce your risk by:   Keep blood pressure, cholesterol, and blood sugar at healthy levels   Smoking Cessation   Maintain a healthy weight  BMI 20-25   Eat a healthy diet  Plenty of fresh fruit, vegetables, and whole grains  Limit saturated fats, foods high in sodium, and added sugars  DASH and Mediterranean diet   Increase physical activity  Recommend moderate physically activity for 150 minutes per week/ 30 minutes a day for five days a week These can be broken up into three separate ten-minute sessions during the day.   Reduce Stress  Meditation, slow breathing exercises, yoga, coloring books  Dental visits twice a year   

## 2021-07-28 LAB — COMPLETE METABOLIC PANEL WITH GFR
AG Ratio: 1.8 (calc) (ref 1.0–2.5)
ALT: 24 U/L (ref 6–29)
AST: 29 U/L (ref 10–35)
Albumin: 4.4 g/dL (ref 3.6–5.1)
Alkaline phosphatase (APISO): 99 U/L (ref 37–153)
BUN: 13 mg/dL (ref 7–25)
CO2: 27 mmol/L (ref 20–32)
Calcium: 9.5 mg/dL (ref 8.6–10.4)
Chloride: 105 mmol/L (ref 98–110)
Creat: 0.75 mg/dL (ref 0.50–1.05)
Globulin: 2.5 g/dL (calc) (ref 1.9–3.7)
Glucose, Bld: 75 mg/dL (ref 65–99)
Potassium: 4.3 mmol/L (ref 3.5–5.3)
Sodium: 140 mmol/L (ref 135–146)
Total Bilirubin: 0.6 mg/dL (ref 0.2–1.2)
Total Protein: 6.9 g/dL (ref 6.1–8.1)
eGFR: 91 mL/min/{1.73_m2} (ref >=60)

## 2021-07-28 LAB — CBC WITH DIFFERENTIAL/PLATELET
Absolute Monocytes: 501 cells/uL (ref 200–950)
Basophils Absolute: 72 cells/uL (ref 0–200)
Basophils Relative: 1.3 %
Eosinophils Absolute: 99 cells/uL (ref 15–500)
Eosinophils Relative: 1.8 %
HCT: 43.5 % (ref 35.0–45.0)
Hemoglobin: 14.9 g/dL (ref 11.7–15.5)
Lymphs Abs: 1623 cells/uL (ref 850–3900)
MCH: 30.2 pg (ref 27.0–33.0)
MCHC: 34.3 g/dL (ref 32.0–36.0)
MCV: 88.2 fL (ref 80.0–100.0)
MPV: 11.2 fL (ref 7.5–12.5)
Monocytes Relative: 9.1 %
Neutro Abs: 3207 cells/uL (ref 1500–7800)
Neutrophils Relative %: 58.3 %
Platelets: 292 10*3/uL (ref 140–400)
RBC: 4.93 10*6/uL (ref 3.80–5.10)
RDW: 11.9 % (ref 11.0–15.0)
Total Lymphocyte: 29.5 %
WBC: 5.5 10*3/uL (ref 3.8–10.8)

## 2021-07-28 NOTE — Progress Notes (Signed)
CBC and CMP are normal.

## 2021-08-02 ENCOUNTER — Other Ambulatory Visit: Payer: Self-pay | Admitting: Physician Assistant

## 2021-08-02 DIAGNOSIS — M0579 Rheumatoid arthritis with rheumatoid factor of multiple sites without organ or systems involvement: Secondary | ICD-10-CM

## 2021-08-02 NOTE — Telephone Encounter (Signed)
Next Visit: 01/25/2022  Last Visit: 07/27/2021  Labs: 07/27/2021 CBC and CMP are normal.  Eye exam: 10/20/2020 WN   Current Dose per office note 07/27/2021: Plaquenil 200 mg 1 tablet by mouth twice daily   [id number] arthritis involving multiple sites with positive rheumatoid factor   Last Fill: 02/20/2021  Okay to refill Plaquenil?

## 2021-08-21 ENCOUNTER — Other Ambulatory Visit: Payer: Self-pay | Admitting: Obstetrics and Gynecology

## 2021-08-21 DIAGNOSIS — Z1231 Encounter for screening mammogram for malignant neoplasm of breast: Secondary | ICD-10-CM

## 2021-09-23 ENCOUNTER — Other Ambulatory Visit: Payer: Self-pay | Admitting: Physician Assistant

## 2021-09-23 DIAGNOSIS — M0579 Rheumatoid arthritis with rheumatoid factor of multiple sites without organ or systems involvement: Secondary | ICD-10-CM

## 2021-09-28 ENCOUNTER — Ambulatory Visit
Admission: RE | Admit: 2021-09-28 | Discharge: 2021-09-28 | Disposition: A | Discharge status: Home / Self Care | Payer: Managed Care, Other (non HMO) | Source: Ambulatory Visit | Attending: Obstetrics and Gynecology | Admitting: Obstetrics and Gynecology

## 2021-09-28 DIAGNOSIS — Z1231 Encounter for screening mammogram for malignant neoplasm of breast: Secondary | ICD-10-CM

## 2022-01-11 NOTE — Progress Notes (Addendum)
? ?Office Visit Note ? ?Patient: Courtney Kramer             ?Date of Birth: 02-13-1961           ?MRN: [id number]             ?PCP: Dema Severin, NP ?Referring: Dema Severin, NP ?Visit Date: 01/25/2022 ?Occupation: @GUAROCC @ ? ?Subjective:  ?Left knee joint pain  ? ?History of Present Illness: Courtney Kramer is a 61 y.o. female with history of seropositive rheumatoid arthritis, osteoarthritis, and DDD.  She is taking Plaquenil 200 mg 1 tablet by mouth twice daily.  She is tolerating Plaquenil without any side effects and has not missed any doses recently.  She continues to experience pain in both hands especially in the third PIP joint bilaterally.  She previously had cortisone injections which provided relief for about 3 weeks.  She is currently having increased discomfort in the left knee.  She notices stiffness when rising from a seated position and pain climbing steps.  She denies any joint swelling in the left knee joint at this time.  She takes Celebrex 200 mg daily for pain relief.  She requested a left knee joint cortisone injection today. ?She does not want to make any medication changes at this time. ? ? ?Activities of Daily Living:  ?Patient reports morning stiffness for 30-60 minutes.   ?Patient Denies nocturnal pain.  ?Difficulty dressing/grooming: Denies ?Difficulty climbing stairs: Reports ?Difficulty getting out of chair: Denies ?Difficulty using hands for taps, buttons, cutlery, and/or writing: Reports ? ?Review of Systems  ?Constitutional:  Positive for fatigue.  ?HENT:  Negative for mouth sores, mouth dryness and nose dryness.   ?Eyes:  Negative for pain, itching and dryness.  ?Respiratory:  Negative for shortness of breath and difficulty breathing.   ?Cardiovascular:  Negative for chest pain and palpitations.  ?Gastrointestinal:  Negative for blood in stool, constipation and diarrhea.  ?Endocrine: Negative for increased urination.  ?Genitourinary:  Negative for difficulty urinating.   ?Musculoskeletal:  Positive for joint pain, joint pain, joint swelling, myalgias, morning stiffness, muscle tenderness and myalgias.  ?Skin:  Negative for color change, rash and redness.  ?Allergic/Immunologic: Negative for susceptible to infections.  ?Neurological:  Negative for dizziness, numbness, headaches, memory loss and weakness.  ?Hematological:  Positive for bruising/bleeding tendency.  ?Psychiatric/Behavioral:  Negative for confusion.   ? ?PMFS History:  ?Patient Active Problem List  ? Diagnosis Date Noted  ? Rheumatoid arthritis involving multiple sites with positive rheumatoid factor (HCC) 04/21/2020  ? High risk medication use 04/21/2020  ? Primary osteoarthritis of both hands 04/21/2020  ? Primary osteoarthritis of both knees 04/21/2020  ? Primary osteoarthritis of both feet 04/21/2020  ? DDD (degenerative disc disease), lumbar 04/21/2020  ? Fracture, Colles, right, closed 04/09/2018  ? Closed fracture of right distal radius and ulna, initial encounter 03/27/2018  ? Lumbar radiculopathy 09/19/2016  ? RENAL CALCULUS 05/04/2008  ? FLANK PAIN, RIGHT 05/04/2008  ?  ?Past Medical History:  ?Diagnosis Date  ? Anxiety   ? Chronic back pain   ? Chronic back pain   ? GERD (gastroesophageal reflux disease)   ? History of cardiac murmur   ? 07-26-2020  per pt happen after last pregnancy, work-up done was ok, and was told few years pcp no longer hears it  ? History of kidney stones   ? Kidney stones   ? OA (osteoarthritis)   ? hands , knees, feet  ? RA (rheumatoid arthritis) (HCC)   ?  rheumotology--- Dr Kathie RhodesS. Deveshwar--- multiple sites seropositive/ positive rheumotoid factor,  taking plaquinel  ? Right ureteral calculus   ? Wears glasses   ?  ?Family History  ?Problem Relation Age of Onset  ? Fibromyalgia Mother   ? Heart disease Mother   ? Macular degeneration Mother   ? Rheum arthritis Father   ? Rheum arthritis Sister   ? Cancer Brother   ? Breast cancer Neg Hx   ? ?Past Surgical History:  ?Procedure Laterality  Date  ? COLONOSCOPY  2012  ? CYSTO/  RIGHT URETERAL STENT PLACEMENT  05-10-2008  ? CYSTOSCOPY W/ URETERAL STENT PLACEMENT Right 06/09/2014  ? Procedure: CYSTOSCOPY WITH RETROGRADE PYELOGRAM/URETERAL STENT PLACEMENT;  Surgeon: Valetta Fulleravid S Grapey, MD;  [facility];  Service: Urology;  Laterality: Right;  ? CYSTOSCOPY W/ URETERAL STENT PLACEMENT Left 02/17/2020  ? Procedure: CYSTOSCOPY WITH RETROGRADE PYELOGRAM/URETERAL STENT PLACEMENT;  Surgeon: Sebastian AcheManny, Theodore, MD;  Location: [facility];  Service: Urology;  Laterality: Left;  45 MINS  ? CYSTOSCOPY/URETEROSCOPY/HOLMIUM LASER/STENT PLACEMENT Left 02/22/2020  ? Procedure: CYSTOSCOPY/LEFT URETEROSCOPY/HOLMIUM LASER/STENT EXCHANGE;  Surgeon: Bjorn PippinWrenn, John, MD;  Location: [facility];  Service: Urology;  Laterality: Left;  ? CYSTOSCOPY/URETEROSCOPY/HOLMIUM LASER/STENT PLACEMENT Right 07/28/2020  ? Procedure: CYSTOSCOPY RIGHT URETEROSCOPY RIGHT RETROGRADE PYELOGRAM HOLMIUM LASER/STENT PLACEMENT;  Surgeon: Bjorn PippinWrenn, John, MD;  [facility] [place];  Service: Urology;  Laterality: Right;  ? EXTRACORPOREAL SHOCK WAVE LITHOTRIPSY Right 05-10-2008  ? KNEE ARTHROSCOPY Left 2009  ? OPEN REDUCTION INTERNAL FIXATION (ORIF) DISTAL RADIAL FRACTURE Right 03/27/2018  ? Procedure: OPEN REDUCTION INTERNAL FIXATION (ORIF) RIGHT DISTAL RADIUS FRACTURE;  Surgeon: Tarry KosXu, Naiping M, MD;  Location: [facility];  Service: Orthopedics;  Laterality: Right;  ? WISDOM TOOTH EXTRACTION  AGE 34  ? ?Social History  ? ?Social History Narrative  ? Not on file  ? ? ?There is no immunization history on file for this patient.  ? ?Objective: ?Vital Signs: BP (!) 153/80 (BP Location: Right Arm, Patient Position: Sitting, Cuff Size: Normal)   Pulse 73   Ht 5\' 6"  (1.676 m)   Wt 194 lb 9.6 oz (88.3 kg)   BMI 31.41 kg/m?   ? ?Physical Exam ?Vitals and nursing note reviewed.  ?Constitutional:   ?   Appearance: She is well-developed.  ?HENT:  ?   Head: Normocephalic and atraumatic.   ?Eyes:  ?   Conjunctiva/sclera: Conjunctivae normal.  ?Cardiovascular:  ?   Rate and Rhythm: Normal rate and regular rhythm.  ?   Heart sounds: Normal heart sounds.  ?Pulmonary:  ?   Effort: Pulmonary effort is normal.  ?   Breath sounds: Normal breath sounds.  ?Abdominal:  ?   General: Bowel sounds are normal.  ?   Palpations: Abdomen is soft.  ?Musculoskeletal:  ?   Cervical back: Normal range of motion.  ?Skin: ?   General: Skin is warm and dry.  ?   Capillary Refill: Capillary refill takes less than 2 seconds.  ?Neurological:  ?   Mental Status: She is alert and oriented to person, place, and time.  ?Psychiatric:     ?   Behavior: Behavior normal.  ?  ? ?Musculoskeletal Exam: C-spine, thoracic spine, lumbar spine have good range of motion.  Some discomfort in the lumbar spine.  Shoulder joints, elbow joints, wrist joints, MCPs, PIPs, DIPs have good range of motion with no synovitis.  Thickening and tenderness of bilateral third PIP joints.  Complete fist formation bilaterally.  Hip joints have  good range of motion with no groin pain.  Painful range of motion of the left knee with warmth but no effusion.  Ankle joints have good range of motion with no tenderness or joint swelling. ? ?CDAI Exam: ?CDAI Score: 4.2  ?Patient Global: 8 mm; Provider Global: 4 mm ?Swollen: 0 ; Tender: 3  ?Joint Exam 01/25/2022  ? ?   Right  Left  ?PIP 3   Tender     ?PIP 4      Tender  ?Knee      Tender  ? ? ? ?Investigation: ?No additional findings. ? ?Imaging: ?XR KNEE 3 VIEW LEFT ? ?Result Date: 01/25/2022 ?Moderate medial compartment narrowing with intercondylar osteophytes was noted.  Moderate patellofemoral narrowing was noted.  No radiographic progression was noted when compared to the x-rays of 2020.  No chondrocalcinosis was noted. Impression: These findings are consistent with moderate osteoarthritis and moderate chondromalacia patella.  ? ?Recent Labs: ?Lab Results  ?Component Value Date  ? WBC 5.5 07/27/2021  ? HGB 14.9  07/27/2021  ? PLT 292 07/27/2021  ? NA 140 07/27/2021  ? K 4.3 07/27/2021  ? CL 105 07/27/2021  ? CO2 27 07/27/2021  ? GLUCOSE 75 07/27/2021  ? BUN 13 07/27/2021  ? CREATININE 0.75 07/27/2021  ? BILITOT 0.6 07/28/19

## 2022-01-25 ENCOUNTER — Ambulatory Visit: Payer: Commercial Managed Care - HMO | Admitting: Physician Assistant

## 2022-01-25 ENCOUNTER — Ambulatory Visit (INDEPENDENT_AMBULATORY_CARE_PROVIDER_SITE_OTHER): Payer: Commercial Managed Care - HMO

## 2022-01-25 ENCOUNTER — Encounter: Payer: Self-pay | Admitting: Physician Assistant

## 2022-01-25 VITALS — BP 153/80 | HR 73 | Ht 66.0 in | Wt 194.6 lb

## 2022-01-25 DIAGNOSIS — M0579 Rheumatoid arthritis with rheumatoid factor of multiple sites without organ or systems involvement: Secondary | ICD-10-CM

## 2022-01-25 DIAGNOSIS — M19042 Primary osteoarthritis, left hand: Secondary | ICD-10-CM

## 2022-01-25 DIAGNOSIS — M25562 Pain in left knee: Secondary | ICD-10-CM | POA: Diagnosis not present

## 2022-01-25 DIAGNOSIS — R5383 Other fatigue: Secondary | ICD-10-CM

## 2022-01-25 DIAGNOSIS — N200 Calculus of kidney: Secondary | ICD-10-CM

## 2022-01-25 DIAGNOSIS — M19041 Primary osteoarthritis, right hand: Secondary | ICD-10-CM

## 2022-01-25 DIAGNOSIS — M170 Bilateral primary osteoarthritis of knee: Secondary | ICD-10-CM | POA: Diagnosis not present

## 2022-01-25 DIAGNOSIS — Z79899 Other long term (current) drug therapy: Secondary | ICD-10-CM | POA: Diagnosis not present

## 2022-01-25 DIAGNOSIS — M19071 Primary osteoarthritis, right ankle and foot: Secondary | ICD-10-CM

## 2022-01-25 DIAGNOSIS — M19072 Primary osteoarthritis, left ankle and foot: Secondary | ICD-10-CM

## 2022-01-25 DIAGNOSIS — M5136 Other intervertebral disc degeneration, lumbar region: Secondary | ICD-10-CM

## 2022-01-25 DIAGNOSIS — Z8261 Family history of arthritis: Secondary | ICD-10-CM

## 2022-01-25 DIAGNOSIS — S52531D Colles' fracture of right radius, subsequent encounter for closed fracture with routine healing: Secondary | ICD-10-CM

## 2022-01-25 MED ORDER — LIDOCAINE HCL 1 % IJ SOLN
1.5000 mL | INTRAMUSCULAR | Status: AC | PRN
  Administered 2022-01-25: 1.5 mL

## 2022-01-25 MED ORDER — TRIAMCINOLONE ACETONIDE 40 MG/ML IJ SUSP
40.0000 mg | INTRAMUSCULAR | Status: AC | PRN
  Administered 2022-01-25: 40 mg via INTRA_ARTICULAR

## 2022-01-26 LAB — CBC WITH DIFFERENTIAL/PLATELET
Absolute Monocytes: 501 cells/uL (ref 200–950)
Basophils Absolute: 72 cells/uL (ref 0–200)
Basophils Relative: 1.1 %
Eosinophils Absolute: 169 cells/uL (ref 15–500)
Eosinophils Relative: 2.6 %
HCT: 44.4 % (ref 35.0–45.0)
Hemoglobin: 14.9 g/dL (ref 11.7–15.5)
Lymphs Abs: 1846 cells/uL (ref 850–3900)
MCH: 30.2 pg (ref 27.0–33.0)
MCHC: 33.6 g/dL (ref 32.0–36.0)
MCV: 89.9 fL (ref 80.0–100.0)
MPV: 10.3 fL (ref 7.5–12.5)
Monocytes Relative: 7.7 %
Neutro Abs: 3913 cells/uL (ref 1500–7800)
Neutrophils Relative %: 60.2 %
Platelets: 273 10*3/uL (ref 140–400)
RBC: 4.94 10*6/uL (ref 3.80–5.10)
RDW: 11.7 % (ref 11.0–15.0)
Total Lymphocyte: 28.4 %
WBC: 6.5 10*3/uL (ref 3.8–10.8)

## 2022-01-26 LAB — COMPLETE METABOLIC PANEL WITH GFR
AG Ratio: 1.7 (calc) (ref 1.0–2.5)
ALT: 17 U/L (ref 6–29)
AST: 27 U/L (ref 10–35)
Albumin: 4.3 g/dL (ref 3.6–5.1)
Alkaline phosphatase (APISO): 113 U/L (ref 37–153)
BUN: 19 mg/dL (ref 7–25)
CO2: 28 mmol/L (ref 20–32)
Calcium: 9.7 mg/dL (ref 8.6–10.4)
Chloride: 105 mmol/L (ref 98–110)
Creat: 0.8 mg/dL (ref 0.50–1.05)
Globulin: 2.5 g/dL (calc) (ref 1.9–3.7)
Glucose, Bld: 83 mg/dL (ref 65–99)
Potassium: 4.2 mmol/L (ref 3.5–5.3)
Sodium: 141 mmol/L (ref 135–146)
Total Bilirubin: 0.6 mg/dL (ref 0.2–1.2)
Total Protein: 6.8 g/dL (ref 6.1–8.1)
eGFR: 84 mL/min/{1.73_m2} (ref >=60)

## 2022-01-26 NOTE — Progress Notes (Signed)
CBC and CMP WNL

## 2022-02-14 ENCOUNTER — Other Ambulatory Visit: Payer: Self-pay

## 2022-02-14 ENCOUNTER — Ambulatory Visit: Payer: Commercial Managed Care - HMO | Admitting: Orthopaedic Surgery

## 2022-02-14 ENCOUNTER — Ambulatory Visit (INDEPENDENT_AMBULATORY_CARE_PROVIDER_SITE_OTHER): Payer: Commercial Managed Care - HMO

## 2022-02-14 ENCOUNTER — Encounter: Payer: Self-pay | Admitting: Orthopaedic Surgery

## 2022-02-14 DIAGNOSIS — M25561 Pain in right knee: Secondary | ICD-10-CM | POA: Diagnosis not present

## 2022-02-14 DIAGNOSIS — G8929 Other chronic pain: Secondary | ICD-10-CM | POA: Diagnosis not present

## 2022-02-14 NOTE — Progress Notes (Signed)
Office Visit Note   Patient: Courtney Kramer           Date of Birth: 12/21/1960           MRN: [id number] Visit Date: 02/14/2022              Requested by: Dema SeverinYork, Regina F, NP [address] [place],  [place] [id number] PCP: Dema SeverinYork, Regina F, NP   Assessment & Plan: Visit Diagnoses:  1. Chronic pain of right knee     Plan: Impression is acute right knee injury concerning for lateral meniscus tear.  Given difficulty with weightbearing we will need to also rule out fracture.  We will order MRI of the right knee to assess for both.  Follow-up after the MRI.  Follow-Up Instructions: No follow-ups on file.   Orders:  Orders Placed This Encounter  Procedures   XR KNEE 3 VIEW RIGHT   No orders of the defined types were placed in this encounter.     Procedures: No procedures performed   Clinical Data: No additional findings.   Subjective: Chief Complaint  Patient presents with   Right Knee - Pain    HPI Courtney Kramer is a 61 year old female here for acute right knee injury.  She was walking yesterday and felt a pop and then fell.  Since then she has been unable to weight-bear and has had to use a walker.  She endorses swelling.  She has severe pain with any weightbearing.  Review of Systems  Constitutional: Negative.   HENT: Negative.    Respiratory: Negative.    Cardiovascular: Negative.   Endocrine: Negative.   Musculoskeletal: Negative.   Neurological: Negative.   Hematological: Negative.   Psychiatric/Behavioral: Negative.    All other systems reviewed and are negative.   Objective: Vital Signs: There were no vitals taken for this visit.  Physical Exam Vitals and nursing note reviewed.  Constitutional:      Appearance: She is well-developed.  Pulmonary:     Effort: Pulmonary effort is normal.  Skin:    General: Skin is warm.     Capillary Refill: Capillary refill takes less than 2 seconds.  Neurological:     Mental Status: She is alert and oriented to person,  place, and time.  Psychiatric:        Behavior: Behavior normal.        Thought Content: Thought content normal.        Judgment: Judgment normal.    Ortho Exam Examination right knee shows trace effusion.  Lateral joint line tenderness.  Pain at the lateral joint line with McMurray test.  Collaterals and cruciates are stable.  Specialty Comments:  No specialty comments available.  Imaging: XR KNEE 3 VIEW RIGHT  Result Date: 02/14/2022 No acute or structural abnormalities    PMFS History: Patient Active Problem List   Diagnosis Date Noted   Rheumatoid arthritis involving multiple sites with positive rheumatoid factor (HCC) 04/21/2020   High risk medication use 04/21/2020   Primary osteoarthritis of both hands 04/21/2020   Primary osteoarthritis of both knees 04/21/2020   Primary osteoarthritis of both feet 04/21/2020   DDD (degenerative disc disease), lumbar 04/21/2020   Fracture, Colles, right, closed 04/09/2018   Closed fracture of right distal radius and ulna, initial encounter 03/27/2018   Lumbar radiculopathy 09/19/2016   RENAL CALCULUS 05/04/2008   FLANK PAIN, RIGHT 05/04/2008   Past Medical History:  Diagnosis Date   Anxiety    Chronic back pain  Chronic back pain    GERD (gastroesophageal reflux disease)    History of cardiac murmur    07-26-2020  per pt happen after last pregnancy, work-up done was ok, and was told few years pcp no longer hears it   History of kidney stones    Kidney stones    OA (osteoarthritis)    hands , knees, feet   RA (rheumatoid arthritis) (HCC)    rheumotology--- Dr Kathie Rhodes. Deveshwar--- multiple sites seropositive/ positive rheumotoid factor,  taking plaquinel   Right ureteral calculus    Wears glasses     Family History  Problem Relation Age of Onset   Fibromyalgia Mother    Heart disease Mother    Macular degeneration Mother    Rheum arthritis Father    Rheum arthritis Sister    Cancer Brother    Breast cancer Neg Hx      Past Surgical History:  Procedure Laterality Date   COLONOSCOPY  2012   CYSTO/  RIGHT URETERAL STENT PLACEMENT  05-10-2008   CYSTOSCOPY W/ URETERAL STENT PLACEMENT Right 06/09/2014   Procedure: CYSTOSCOPY WITH RETROGRADE PYELOGRAM/URETERAL STENT PLACEMENT;  Surgeon: Valetta Fuller, MD;  Location[facility];  Service: Urology;  Laterality: Right;   CYSTOSCOPY W/ URETERAL STENT PLACEMENT Left 02/17/2020   Procedure: CYSTOSCOPY WITH RETROGRADE PYELOGRAM/URETERAL STENT PLACEMENT;  Surgeon: Sebastian Ache, MD;  Location: [facility];  Service: Urology;  Laterality: Left;  45 MINS   CYSTOSCOPY/URETEROSCOPY/HOLMIUM LASER/STENT PLACEMENT Left 02/22/2020   Procedure: CYSTOSCOPY/LEFT URETEROSCOPY/HOLMIUM LASER/STENT EXCHANGE;  Surgeon: [name], MD;  Location: [facility];  Service: Urology;  Laterality: Left;   CYSTOSCOPY/URETEROSCOPY/HOLMIUM LASER/STENT PLACEMENT Right 07/28/2020   Procedure: CYSTOSCOPY RIGHT URETEROSCOPY RIGHT RETROGRADE PYELOGRAM HOLMIUM LASER/STENT PLACEMENT;  Surgeon: [name], MD;  Location[facility];  Service: Urology;  Laterality: Right;   EXTRACORPOREAL SHOCK WAVE LITHOTRIPSY Right 05-10-2008   KNEE ARTHROSCOPY Left 2009   OPEN REDUCTION INTERNAL FIXATION (ORIF) DISTAL RADIAL FRACTURE Right 03/27/2018   Procedure: OPEN REDUCTION INTERNAL FIXATION (ORIF) RIGHT DISTAL RADIUS FRACTURE;  Surgeon: Tarry Kos, MD;  Location: [facility];  Service: Orthopedics;  Laterality: Right;   WISDOM TOOTH EXTRACTION  AGE 10   Social History   Occupational History   Not on file  Tobacco Use   Smoking status: Never    Passive exposure: Never   Smokeless tobacco: Never  Vaping Use   Vaping Use: Never used  Substance and Sexual Activity   Alcohol use: Yes    Comment: OCCASONAL   Drug use: Never   Sexual activity: Not on file

## 2022-02-21 ENCOUNTER — Ambulatory Visit
Admission: RE | Admit: 2022-02-21 | Discharge: 2022-02-21 | Disposition: A | Discharge status: Home / Self Care | Payer: Commercial Managed Care - HMO | Source: Ambulatory Visit | Attending: Orthopaedic Surgery | Admitting: Orthopaedic Surgery

## 2022-02-21 DIAGNOSIS — G8929 Other chronic pain: Secondary | ICD-10-CM

## 2022-02-28 ENCOUNTER — Ambulatory Visit: Payer: Commercial Managed Care - HMO | Admitting: Physician Assistant

## 2022-02-28 DIAGNOSIS — M25561 Pain in right knee: Secondary | ICD-10-CM | POA: Diagnosis not present

## 2022-02-28 DIAGNOSIS — G8929 Other chronic pain: Secondary | ICD-10-CM | POA: Diagnosis not present

## 2022-02-28 MED ORDER — BUPIVACAINE HCL 0.25 % IJ SOLN
2.0000 mL | INTRAMUSCULAR | Status: AC | PRN
  Administered 2022-02-28: 2 mL via INTRA_ARTICULAR

## 2022-02-28 MED ORDER — METHYLPREDNISOLONE ACETATE 40 MG/ML IJ SUSP
40.0000 mg | INTRAMUSCULAR | Status: AC | PRN
  Administered 2022-02-28: 40 mg via INTRA_ARTICULAR

## 2022-02-28 MED ORDER — TRAMADOL HCL 50 MG PO TABS: 50.0000 mg | ORAL_TABLET | Freq: Two times a day (BID) | ORAL | 2 refills | Status: AC | PRN

## 2022-02-28 MED ORDER — LIDOCAINE HCL 1 % IJ SOLN
2.0000 mL | INTRAMUSCULAR | Status: AC | PRN
  Administered 2022-02-28: 2 mL

## 2022-02-28 NOTE — Progress Notes (Signed)
Office Visit Note   Patient: Courtney Kramer           Date of Birth: 25-Jan-1961           MRN: [id number] Visit Date: 02/28/2022              Requested by: Dema Severin, NP [address] [place],  [place] [address] PCP: Dema Severin, NP   Assessment & Plan: Visit Diagnoses:  1. Chronic pain of right knee     Plan: Impression is right knee arthritis flareup.  At this point, there are no structural abnormalities other than mild degenerative changes the medial and patellofemoral compartments.  We have discussed right knee aspiration and cortisone injection.  She is not interested in the aspiration but would like to proceed with injection today.  She will follow-up with [place] as needed.  Follow-Up Instructions: No follow-ups on file.   Orders:  No orders of the defined types were placed in this encounter.  No orders of the defined types were placed in this encounter.     Procedures: Large Joint Inj: R knee on 02/28/2022 3:23 PM Indications: pain Details: 22 G needle, anterolateral approach Medications: 2 mL lidocaine 1 %; 2 mL bupivacaine 0.25 %; 40 mg methylPREDNISolone acetate 40 MG/ML      Clinical Data: No additional findings.   Subjective: Chief Complaint  Patient presents with   Right Knee - Pain    HPI patient is a pleasant 61 year old female who is here today to discuss MRI results of the right knee.  She was seen in our office a few weeks ago following an injury to the right knee.  MRI results are unremarkable with the exception of mild degenerative changes to the medial patellofemoral compartments as well as a large joint effusion.  She notes her pain has slightly improved.  She is not taking anything for this.  She is able to ambulate without assistance at this point.      Objective: Vital Signs: There were no vitals taken for this visit.    Ortho Exam right knee shows a moderate effusion.  Mild joint line tenderness.  She is neurovascular tact  distally.  Specialty Comments:  No specialty comments available.  Imaging: No new imaging   PMFS History: Patient Active Problem List   Diagnosis Date Noted   Rheumatoid arthritis involving multiple sites with positive rheumatoid factor (HCC) 04/21/2020   High risk medication use 04/21/2020   Primary osteoarthritis of both hands 04/21/2020   Primary osteoarthritis of both knees 04/21/2020   Primary osteoarthritis of both feet 04/21/2020   DDD (degenerative disc disease), lumbar 04/21/2020   Fracture, Colles, right, closed 04/09/2018   Closed fracture of right distal radius and ulna, initial encounter 03/27/2018   Lumbar radiculopathy 09/19/2016   RENAL CALCULUS 05/04/2008   FLANK PAIN, RIGHT 05/04/2008   Past Medical History:  Diagnosis Date   Anxiety    Chronic back pain    Chronic back pain    GERD (gastroesophageal reflux disease)    History of cardiac murmur    07-26-2020  per pt happen after last pregnancy, work-up done was ok, and was told few years pcp no longer hears it   History of kidney stones    Kidney stones    OA (osteoarthritis)    hands , knees, feet   RA (rheumatoid arthritis) (HCC)    rheumotology--- Dr Kathie Rhodes. Deveshwar--- multiple sites seropositive/ positive rheumotoid factor,  taking plaquinel  Right ureteral calculus    Wears glasses     Family History  Problem Relation Age of Onset   Fibromyalgia Mother    Heart disease Mother    Macular degeneration Mother    Rheum arthritis Father    Rheum arthritis Sister    Cancer Brother    Breast cancer Neg Hx     Past Surgical History:  Procedure Laterality Date   COLONOSCOPY  2012   CYSTO/  RIGHT URETERAL STENT PLACEMENT  05-10-2008   CYSTOSCOPY W/ URETERAL STENT PLACEMENT Right 06/09/2014   Procedure: CYSTOSCOPY WITH RETROGRADE PYELOGRAM/URETERAL STENT PLACEMENT;  Surgeon: Valetta Fuller, MD;  Location: [facility];  Service: Urology;  Laterality: Right;   CYSTOSCOPY W/ URETERAL STENT  PLACEMENT Left 02/17/2020   Procedure: CYSTOSCOPY WITH RETROGRADE PYELOGRAM/URETERAL STENT PLACEMENT;  Surgeon: Sebastian Ache, MD;  Location: [facility];  Service: Urology;  Laterality: Left;  45 MINS   CYSTOSCOPY/URETEROSCOPY/HOLMIUM LASER/STENT PLACEMENT Left 02/22/2020   Procedure: CYSTOSCOPY/LEFT URETEROSCOPY/HOLMIUM LASER/STENT EXCHANGE;  Surgeon: [name], MD;  Location: [facility];  Service: Urology;  Laterality: Left;   CYSTOSCOPY/URETEROSCOPY/HOLMIUM LASER/STENT PLACEMENT Right 07/28/2020   Procedure: CYSTOSCOPY RIGHT URETEROSCOPY RIGHT RETROGRADE PYELOGRAM HOLMIUM LASER/STENT PLACEMENT;  Surgeon: [name], MD;  Location: [facility];  Service: Urology;  Laterality: Right;   EXTRACORPOREAL SHOCK WAVE LITHOTRIPSY Right 05-10-2008   KNEE ARTHROSCOPY Left 2009   OPEN REDUCTION INTERNAL FIXATION (ORIF) DISTAL RADIAL FRACTURE Right 03/27/2018   Procedure: OPEN REDUCTION INTERNAL FIXATION (ORIF) RIGHT DISTAL RADIUS FRACTURE;  Surgeon: Tarry Kos, MD;  Location: [facility];  Service: Orthopedics;  Laterality: Right;   WISDOM TOOTH EXTRACTION  AGE 2   Social History   Occupational History   Not on file  Tobacco Use   Smoking status: Never    Passive exposure: Never   Smokeless tobacco: Never  Vaping Use   Vaping Use: Never used  Substance and Sexual Activity   Alcohol use: Yes    Comment: OCCASONAL   Drug use: Never   Sexual activity: Not on file

## 2022-03-05 ENCOUNTER — Other Ambulatory Visit: Payer: Self-pay | Admitting: Rheumatology

## 2022-03-05 NOTE — Telephone Encounter (Unsigned)
Patient called stating at her last appointment with Ladona Ridgel on 01/25/22 adding additional medication was discussed, but patient states she declined.  Patient states since that appointment she has been experiencing pain in her right knee and had an MRI which showed arthritis.  Patient states she had a cortisone injection which only helped for 1-2 days and prescribed Tramadol which hasn't given her any pain relief.  Patient would like to discuss the medication with Dr. Fatima Sanger nurse before scheduling an appointment.

## 2022-03-06 ENCOUNTER — Telehealth: Payer: Self-pay | Admitting: Rheumatology

## 2022-03-06 ENCOUNTER — Other Ambulatory Visit: Payer: Self-pay | Admitting: Physician Assistant

## 2022-03-06 ENCOUNTER — Telehealth: Payer: Self-pay | Admitting: Orthopaedic Surgery

## 2022-03-06 MED ORDER — KETOROLAC TROMETHAMINE 10 MG PO TABS: 10.0000 mg | ORAL_TABLET | Freq: Two times a day (BID) | ORAL | 0 refills | Status: DC | PRN

## 2022-03-06 MED ORDER — PREDNISONE 5 MG PO TABS: ORAL_TABLET | ORAL | 0 refills | Status: DC

## 2022-03-06 NOTE — Telephone Encounter (Signed)
Patient called the office stating that she is "literally dying". Patient states her entire leg is in pain. Patient states she wants to know how she can be prescribed pain medication. Advised patient our providers do not prescribe pain medication. Patient states she was prescribed tramadol by another doctor and it doesn't work at all. Patient requests prednisone be sent in today. [facility] [place]. Patient sent message on 03/05/22 and states she hoped something would have been sent in first thing when Dr. Corliss Skains arrived for the day.

## 2022-03-06 NOTE — Telephone Encounter (Signed)
Patient advised we are sending in prednisone 20 mg tapering by 5 mg every 2 days. Patient advised to take prednisone in the morning with food.  Avoid the use of NSAIDs. Patient expressed understanding.

## 2022-03-06 NOTE — Telephone Encounter (Signed)
Patient states she is struggling to walk. Pain in right knee. Patient is requesting a prescription for Prednisone until she can come in for her appointment on 03/14/2022. Please advise.

## 2022-03-06 NOTE — Telephone Encounter (Signed)
Spoke to patient.  No fevers/chills, redness, etc.  Feels like her joints typically feel with RA flare up but unable to get a hold of deveshwar to change meds.  I have asked her to come in tomorrow for [place] to see and possibly aspirate.  She wants to wait until the am when she wakes up and will call us then if she decides to come.  Sent in toradol in the meantime

## 2022-03-06 NOTE — Telephone Encounter (Signed)
Pt called stating she is in severe pain again. She states she need prednisone and pain meds. Tramadol is not touching her pain. Please send to pharmacy on file. Pt phone number is [phone].

## 2022-03-06 NOTE — Telephone Encounter (Signed)
See previous phone note.  

## 2022-03-06 NOTE — Telephone Encounter (Signed)
Ok to schedule sooner office visit while Courtney Kramer is in the office to discuss other treatment options.

## 2022-03-06 NOTE — Telephone Encounter (Signed)
Ok to send in prednisone 20 mg tapering by 5 mg every 2 days. Take prednisone in the morning with food.  Avoid the use of NSAIDs.

## 2022-03-07 ENCOUNTER — Ambulatory Visit: Payer: Commercial Managed Care - HMO | Admitting: Orthopaedic Surgery

## 2022-03-07 ENCOUNTER — Telehealth: Payer: Self-pay | Admitting: Rheumatology

## 2022-03-07 NOTE — Telephone Encounter (Signed)
Patient has been under the care of Dr. Roda Shutters and Trenda Moots, PA-C at [facility] for management of bilateral knee pain.  She had a left knee joint injection on 01/25/2022 and a right knee joint injection on 02/28/2022.  It appears that she also has reached out to [facility] yesterday for further evaluation.  She has canceled her appointment with Dr. Roda Shutters today and is scheduled to see Dr. Roda Shutters tomorrow.  I would recommend keeping her follow-up appointment with Dr. Roda Shutters. A prednisone taper starting 20 mg tapering by 5 mg every 2 days was sent to the pharmacy yesterday afternoon.  He will take a few days for prednisone to alleviate joint pain and joint swelling.

## 2022-03-07 NOTE — Telephone Encounter (Signed)
Pt called back and wants to come in tomorrow morning to be seen  [phone]

## 2022-03-07 NOTE — Telephone Encounter (Signed)
Patient called the office stating she was able to get Toradol from her orthopedic doctor. Patient states the prednisone is not helping nor is any other medication she is on. Patient states it feels like someone has a rubber band wrapped around her leg and she would like to know if that's normal. Patient requests a call back.

## 2022-03-07 NOTE — Telephone Encounter (Signed)
Called and scheduled patient

## 2022-03-07 NOTE — Telephone Encounter (Signed)
Patient advised she has been under the care of Dr. Roda Shutters and Trenda Moots, PA-C at [facility] for management of bilateral knee pain.  She had a left knee joint injection on 01/25/2022 and a right knee joint injection on 02/28/2022.  It appears that she also has reached out to [facility] yesterday for further evaluation.  She has canceled her appointment with Dr. Roda Shutters today and is scheduled to see Dr. Roda Shutters tomorrow.  Ladona Ridgel would recommend keeping her follow-up appointment with Dr. Roda Shutters. A prednisone taper starting 20 mg tapering by 5 mg every 2 days was sent to the pharmacy yesterday afternoon.  Patient advised it  will take a few days for prednisone to alleviate joint pain and joint swelling. Patient expressed understanding.

## 2022-03-08 ENCOUNTER — Ambulatory Visit: Payer: Commercial Managed Care - HMO | Admitting: Orthopaedic Surgery

## 2022-03-08 ENCOUNTER — Encounter: Payer: Self-pay | Admitting: Orthopaedic Surgery

## 2022-03-08 DIAGNOSIS — M25561 Pain in right knee: Secondary | ICD-10-CM | POA: Diagnosis not present

## 2022-03-08 DIAGNOSIS — G8929 Other chronic pain: Secondary | ICD-10-CM | POA: Diagnosis not present

## 2022-03-08 MED ORDER — BUPIVACAINE HCL 0.5 % IJ SOLN
2.0000 mL | INTRAMUSCULAR | Status: AC | PRN
  Administered 2022-03-08: 2 mL via INTRA_ARTICULAR

## 2022-03-08 MED ORDER — LIDOCAINE HCL 1 % IJ SOLN
2.0000 mL | INTRAMUSCULAR | Status: AC | PRN
  Administered 2022-03-08: 2 mL

## 2022-03-08 NOTE — Progress Notes (Signed)
Office Visit Note   Patient: Courtney Kramer           Date of Birth: September 22, 1960           MRN: [id number] Visit Date: 03/08/2022              Requested by: Dema Severin, NP [address] [place],  [place] [address] PCP: Dema Severin, NP   Assessment & Plan: Visit Diagnoses:  1. Chronic pain of right knee     Plan: I aspirated the knee today and obtained 17 cc of fluid.  Cortisone was injected as well.  MRI was again reviewed which basically shows mild arthritis and synovitis.  I think the rheumatoid arthritis is probably making her symptoms worse and that she may want to consider making appointment with Dr. Tollie Eth to look at some medication changes.  Based on the MRI findings I do not really see anything that would benefit from surgery.  Follow-Up Instructions: No follow-ups on file.   Orders:  No orders of the defined types were placed in this encounter.  No orders of the defined types were placed in this encounter.     Procedures: Large Joint Inj: R knee on 03/08/2022 9:30 AM Indications: pain Details: 22 G needle  Arthrogram: No  Medications: 2 mL lidocaine 1 %; 2 mL bupivacaine 0.5 % Consent was given by the patient. Patient was prepped and draped in the usual sterile fashion.       Clinical Data: No additional findings.   Subjective: Chief Complaint  Patient presents with   Right Knee - Pain    HPI Amali returns today for continued right knee pain.  Feels its not getting better.  Using a walker to help with ambulation.  Review of Systems   Objective: Vital Signs: There were no vitals taken for this visit.  Physical Exam  Ortho Exam Examination of the right knee shows a small joint effusion.  Diffuse tenderness of the knee. Specialty Comments:  No specialty comments available.  Imaging: No results found.   PMFS History: Patient Active Problem List   Diagnosis Date Noted   Rheumatoid arthritis involving multiple sites with positive  rheumatoid factor (HCC) 04/21/2020   High risk medication use 04/21/2020   Primary osteoarthritis of both hands 04/21/2020   Primary osteoarthritis of both knees 04/21/2020   Primary osteoarthritis of both feet 04/21/2020   DDD (degenerative disc disease), lumbar 04/21/2020   Fracture, Colles, right, closed 04/09/2018   Closed fracture of right distal radius and ulna, initial encounter 03/27/2018   Lumbar radiculopathy 09/19/2016   RENAL CALCULUS 05/04/2008   FLANK PAIN, RIGHT 05/04/2008   Past Medical History:  Diagnosis Date   Anxiety    Chronic back pain    Chronic back pain    GERD (gastroesophageal reflux disease)    History of cardiac murmur    07-26-2020  per pt happen after last pregnancy, work-up done was ok, and was told few years pcp no longer hears it   History of kidney stones    Kidney stones    OA (osteoarthritis)    hands , knees, feet   RA (rheumatoid arthritis) (HCC)    rheumotology--- Dr Kathie Rhodes. Deveshwar--- multiple sites seropositive/ positive rheumotoid factor,  taking plaquinel   Right ureteral calculus    Wears glasses     Family History  Problem Relation Age of Onset   Fibromyalgia Mother    Heart disease Mother    Macular  degeneration Mother    Rheum arthritis Father    Rheum arthritis Sister    Cancer Brother    Breast cancer Neg Hx     Past Surgical History:  Procedure Laterality Date   COLONOSCOPY  2012   CYSTO/  RIGHT URETERAL STENT PLACEMENT  05-10-2008   CYSTOSCOPY W/ URETERAL STENT PLACEMENT Right 06/09/2014   Procedure: CYSTOSCOPY WITH RETROGRADE PYELOGRAM/URETERAL STENT PLACEMENT;  Surgeon: Valetta Fuller, MD;  Location: [facility];  Service: Urology;  Laterality: Right;   CYSTOSCOPY W/ URETERAL STENT PLACEMENT Left 02/17/2020   Procedure: CYSTOSCOPY WITH RETROGRADE PYELOGRAM/URETERAL STENT PLACEMENT;  Surgeon: Sebastian Ache, MD;  Location: [facility];  Service: Urology;  Laterality: Left;  45 MINS    CYSTOSCOPY/URETEROSCOPY/HOLMIUM LASER/STENT PLACEMENT Left 02/22/2020   Procedure: CYSTOSCOPY/LEFT URETEROSCOPY/HOLMIUM LASER/STENT EXCHANGE;  Surgeon: [name], MD;  Location: [facility];  Service: Urology;  Laterality: Left;   CYSTOSCOPY/URETEROSCOPY/HOLMIUM LASER/STENT PLACEMENT Right 07/28/2020   Procedure: CYSTOSCOPY RIGHT URETEROSCOPY RIGHT RETROGRADE PYELOGRAM HOLMIUM LASER/STENT PLACEMENT;  Surgeon: [name], MD;  Location: [facility];  Service: Urology;  Laterality: Right;   EXTRACORPOREAL SHOCK WAVE LITHOTRIPSY Right 05-10-2008   KNEE ARTHROSCOPY Left 2009   OPEN REDUCTION INTERNAL FIXATION (ORIF) DISTAL RADIAL FRACTURE Right 03/27/2018   Procedure: OPEN REDUCTION INTERNAL FIXATION (ORIF) RIGHT DISTAL RADIUS FRACTURE;  Surgeon: Tarry Kos, MD;  Location: [facility];  Service: Orthopedics;  Laterality: Right;   WISDOM TOOTH EXTRACTION  AGE 55   Social History   Occupational History   Not on file  Tobacco Use   Smoking status: Never    Passive exposure: Never   Smokeless tobacco: Never  Vaping Use   Vaping Use: Never used  Substance and Sexual Activity   Alcohol use: Yes    Comment: OCCASONAL   Drug use: Never   Sexual activity: Not on file

## 2022-03-09 LAB — SYNOVIAL FLUID ANALYSIS, COMPLETE
Basophils, %: 0 %
Eosinophils-Synovial: 1 % (ref 0–2)
Lymphocytes-Synovial Fld: 46 % (ref 0–74)
Monocyte/Macrophage: 6 % (ref 0–69)
Neutrophil, Synovial: 47 % — ABNORMAL HIGH (ref 0–24)
Synoviocytes, %: 0 % (ref 0–15)
WBC, Synovial: 228 cells/uL — ABNORMAL HIGH (ref <150)

## 2022-03-10 ENCOUNTER — Other Ambulatory Visit: Payer: Self-pay | Admitting: Physician Assistant

## 2022-03-10 DIAGNOSIS — M0579 Rheumatoid arthritis with rheumatoid factor of multiple sites without organ or systems involvement: Secondary | ICD-10-CM

## 2022-03-12 ENCOUNTER — Telehealth: Payer: Self-pay | Admitting: Rheumatology

## 2022-03-12 ENCOUNTER — Other Ambulatory Visit: Payer: Self-pay | Admitting: *Deleted

## 2022-03-12 ENCOUNTER — Telehealth: Payer: Self-pay | Admitting: Orthopaedic Surgery

## 2022-03-12 MED ORDER — PREDNISONE 5 MG PO TABS: ORAL_TABLET | ORAL | 0 refills | Status: DC

## 2022-03-12 MED ORDER — ACETAMINOPHEN-CODEINE 300-30 MG PO TABS: 1.0000 | ORAL_TABLET | Freq: Every day | ORAL | 0 refills | Status: AC | PRN

## 2022-03-12 NOTE — Telephone Encounter (Signed)
Notified patient.

## 2022-03-13 NOTE — Progress Notes (Signed)
Office Visit Note  Patient: Courtney Kramer             Date of Birth: 01/03/1961           MRN: [id number]             PCP: Dema Severin, NP Referring: Dema Severin, NP Visit Date: 03/14/2022 Occupation: @GUAROCC @  Subjective:  Discuss treatment options   History of Present Illness: Courtney Kramer is a 61 y.o. female with history of rheumatoid arthritis.  She is currently taking plaquenil as prescribed.  She has been experiencing a flare in the right knee for the past several weeks.  She states she initially felt a pop behind her right knee and had radiating pain down her leg.  She was evaluated by Dr. 67 initially on 02/14/2022.  She had MRI of the right knee on 02/21/2022 which revealed a large effusion and synovitis.  She was evaluated by Dr. 04/23/2022 most recently on 03/08/2022 at which she had a right knee joint aspiration and cortisone injection performed.  Her knee joint pain has started to improve but she is still having difficulty walking.  She has been using a walker to assist with ambulation.  She is ready to further discuss treatment options for better management of rheumatoid arthritis.  She has had apprehensive to try methotrexate since her sister and father had adverse side effects.  She previously had an inadequate response to 03/10/2022.    Activities of Daily Living:  Patient reports joint stiffness all day  Patient Reports nocturnal pain.  Difficulty dressing/grooming: Reports Difficulty climbing stairs: Reports Difficulty getting out of chair: Reports Difficulty using hands for taps, buttons, cutlery, and/or writing: Denies  Review of Systems  Constitutional:  Positive for activity change and fatigue.  HENT:  Negative for mouth sores, mouth dryness and nose dryness.   Eyes:  Negative for pain, visual disturbance and dryness.  Respiratory:  Negative for cough, hemoptysis, shortness of breath and difficulty breathing.   Cardiovascular:  Negative for chest pain, palpitations,  hypertension and swelling in legs/feet.  Gastrointestinal:  Negative for blood in stool, constipation and diarrhea.  Endocrine: Negative for increased urination.  Genitourinary:  Negative for painful urination.  Musculoskeletal:  Positive for joint pain, gait problem, joint pain, joint swelling, morning stiffness and muscle tenderness. Negative for myalgias, muscle weakness and myalgias.  Skin:  Negative for color change, pallor, rash, hair loss, nodules/bumps, skin tightness, ulcers and sensitivity to sunlight.  Allergic/Immunologic: Negative.  Negative for susceptible to infections.  Neurological:  Positive for headaches. Negative for dizziness, numbness and weakness.  Hematological:  Negative for swollen glands.  Psychiatric/Behavioral:  Positive for depressed mood. Negative for suicidal ideas, homicidal ideas and sleep disturbance. The patient is not nervous/anxious.     PMFS History:  Patient Active Problem List   Diagnosis Date Noted   Rheumatoid arthritis involving multiple sites with positive rheumatoid factor (HCC) 04/21/2020   High risk medication use 04/21/2020   Primary osteoarthritis of both hands 04/21/2020   Primary osteoarthritis of both knees 04/21/2020   Primary osteoarthritis of both feet 04/21/2020   DDD (degenerative disc disease), lumbar 04/21/2020   Fracture, Colles, right, closed 04/09/2018   Closed fracture of right distal radius and ulna, initial encounter 03/27/2018   Lumbar radiculopathy 09/19/2016   RENAL CALCULUS 05/04/2008   FLANK PAIN, RIGHT 05/04/2008    Past Medical History:  Diagnosis Date   Anxiety    Chronic back pain  Chronic back pain    GERD (gastroesophageal reflux disease)    History of cardiac murmur    07-26-2020  per pt happen after last pregnancy, work-up done was ok, and was told few years pcp no longer hears it   History of kidney stones    Kidney stones    OA (osteoarthritis)    hands , knees, feet   RA (rheumatoid arthritis)  ([place])    rheumotology--- Dr Chauncey Cruel. Courtney Kramer--- multiple sites seropositive/ positive rheumotoid factor,  taking plaquinel   Right ureteral calculus    Wears glasses     Family History  Problem Relation Age of Onset   Fibromyalgia Mother    Heart disease Mother    Macular degeneration Mother    Rheum arthritis Father    Rheum arthritis Sister    Cancer Brother    Breast cancer Neg Hx    Past Surgical History:  Procedure Laterality Date   COLONOSCOPY  2012   CYSTO/  RIGHT URETERAL STENT PLACEMENT  05-10-2008   CYSTOSCOPY W/ URETERAL STENT PLACEMENT Right 06/09/2014   Procedure: CYSTOSCOPY WITH RETROGRADE PYELOGRAM/URETERAL STENT PLACEMENT;  Surgeon: Bernestine Amass, MD;  Location: [facility];  Service: Urology;  Laterality: Right;   CYSTOSCOPY W/ URETERAL STENT PLACEMENT Left 02/17/2020   Procedure: CYSTOSCOPY WITH RETROGRADE PYELOGRAM/URETERAL STENT PLACEMENT;  Surgeon: Alexis Frock, MD;  Location: [facility];  Service: Urology;  Laterality: Left;  45 MINS   CYSTOSCOPY/URETEROSCOPY/HOLMIUM LASER/STENT PLACEMENT Left 02/22/2020   Procedure: CYSTOSCOPY/LEFT URETEROSCOPY/HOLMIUM LASER/STENT EXCHANGE;  Surgeon: Irine Seal, MD;  Location: [facility];  Service: Urology;  Laterality: Left;   CYSTOSCOPY/URETEROSCOPY/HOLMIUM LASER/STENT PLACEMENT Right 07/28/2020   Procedure: CYSTOSCOPY RIGHT URETEROSCOPY RIGHT RETROGRADE PYELOGRAM HOLMIUM LASER/STENT PLACEMENT;  Surgeon: Irine Seal, MD;  Location: [facility];  Service: Urology;  Laterality: Right;   EXTRACORPOREAL SHOCK WAVE LITHOTRIPSY Right 05-10-2008   KNEE ARTHROSCOPY Left 2009   OPEN REDUCTION INTERNAL FIXATION (ORIF) DISTAL RADIAL FRACTURE Right 03/27/2018   Procedure: OPEN REDUCTION INTERNAL FIXATION (ORIF) RIGHT DISTAL RADIUS FRACTURE;  Surgeon: Leandrew Koyanagi, MD;  Location: [place];  Service: Orthopedics;  Laterality: Right;   WISDOM TOOTH EXTRACTION  AGE 48   Social History   Social History  Narrative   Not on file    There is no immunization history on file for this patient.   Objective: Vital Signs: BP (!) 180/91   Pulse 72   Ht 5\' 6"  (1.676 m)   Wt 195 lb (88.5 kg)   BMI 31.47 kg/m    Physical Exam Vitals and nursing note reviewed.  Constitutional:      Appearance: She is well-developed.  HENT:     Head: Normocephalic and atraumatic.  Eyes:     Conjunctiva/sclera: Conjunctivae normal.  Cardiovascular:     Rate and Rhythm: Normal rate and regular rhythm.     Heart sounds: Normal heart sounds.  Pulmonary:     Effort: Pulmonary effort is normal.     Breath sounds: Normal breath sounds.  Abdominal:     General: Bowel sounds are normal.     Palpations: Abdomen is soft.  Musculoskeletal:     Cervical back: Normal range of motion.  Skin:    General: Skin is warm and dry.     Capillary Refill: Capillary refill takes less than 2 seconds.  Neurological:     Mental Status: She is alert and oriented to person, place, and time.  Psychiatric:  Behavior: Behavior normal.      Musculoskeletal Exam: C-spine, thoracic spine, lumbar spine have good range of motion.  No midline spinal tenderness.  Shoulder joints have good range of motion with some discomfort bilaterally.  Elbow joints, wrist joints, MCPs, PIPs, DIPs have good range of motion with no synovitis.  PIP and DIP thickening consistent with osteoarthritis of both hands.  Tenderness over bilateral third PIP joints.  Hip joints have good range of motion with no groin pain.  Warmth and mild swelling of the right knee joint.  Left knee joint has no warmth or effusion.  Ankle joints have good range of motion with no tenderness or joint swelling.  No tenderness over MTP joints.  CDAI Exam: CDAI Score: 7.6  Patient Global: 8 mm; Provider Global: 8 mm Swollen: 1 ; Tender: 5  Joint Exam 03/14/2022      Right  Left  Glenohumeral   Tender   Tender  PIP 3   Tender   Tender  Knee  Swollen Tender         Investigation: No additional findings.  Imaging: MR Knee Right w/o contrast  Result Date: 02/21/2022 CLINICAL DATA:  Chronic right knee pain. Could not bear weight for 2 days. EXAM: MRI OF THE RIGHT KNEE WITHOUT CONTRAST TECHNIQUE: Multiplanar, multisequence MR imaging of the right knee was performed. No intravenous contrast was administered. COMPARISON:  Radiographs dated Feb 14, 2022 FINDINGS: MENISCI Medial: Degenerative changes without discrete tear. Lateral: Intact. LIGAMENTS Cruciates: ACL and PCL are intact. Collaterals: Medial collateral ligament is intact. Lateral collateral ligament complex is intact. CARTILAGE Patellofemoral:  No chondral defect. Medial: Mild partial-thickness cartilage loss of the medial femorotibial compartment. Lateral:  No chondral defect. JOINT: Large joint effusion. Mild edema of the Hoffa's fat pad. POPLITEAL FOSSA: Popliteus tendon is intact. No Baker's cyst. EXTENSOR MECHANISM: Intact quadriceps tendon. Intact patellar tendon. Intact lateral patellar retinaculum. Intact medial patellar retinaculum. Intact MPFL. BONES: No aggressive osseous lesion. No fracture or dislocation. Other: No fluid collection or hematoma. Muscles are normal. IMPRESSION: 1. Mild osteoarthritis of the medial tibiofemoral and patellofemoral compartments. 2.  Large suprapatellar joint effusion with synovitis. 3.  Cruciate and collateral ligaments are intact. 4.  No evidence of fracture or dislocation. Electronically Signed   By: Keane Police D.O.   On: 02/21/2022 22:59   XR KNEE 3 VIEW RIGHT  Result Date: 02/14/2022 No acute or structural abnormalities   Recent Labs: Lab Results  Component Value Date   WBC 6.5 01/25/2022   HGB 14.9 01/25/2022   PLT 273 01/25/2022   NA 141 01/25/2022   K 4.2 01/25/2022   CL 105 01/25/2022   CO2 28 01/25/2022   GLUCOSE 83 01/25/2022   BUN 19 01/25/2022   CREATININE 0.80 01/25/2022   BILITOT 0.6 01/25/2022   AST 27 01/25/2022   ALT 17  01/25/2022   PROT 6.8 01/25/2022   CALCIUM 9.7 01/25/2022   GFRAA 87 03/15/2021   QFTBGOLDPLUS NEGATIVE 07/22/2019    Speciality Comments: PLQ eye exam: 10/20/2020 WNL. [facility] f/u 1 year. Arava- no reponse dcd after 4 months  Procedures:  No procedures performed Allergies: Sulfa antibiotics   Assessment / Plan:     Visit Diagnoses: Rheumatoid arthritis involving multiple sites with positive rheumatoid factor ([place]): She has been experiencing recurrent flares in the right knee joint.  She had an MRI of the right knee on 02/21/2022 which revealed a large suprapatellar joint effusion and synovitis.  She had a right  knee joint cortisone injection performed on 02/28/2022 and a right knee joint aspiration and cortisone injection performed on 03/08/2022 by Dr. Erlinda Hong.  She has noticed some relief in her right knee joint since having the aspiration as well as taking a prednisone taper.  She started a new prednisone taper starting at 20 mg tapering by 5 mg every 4 days yesterday.  She remains on Plaquenil 200 mg 1 tablet by mouth twice daily. Patient has now had an inadequate response to 2 DMARDs including Plaquenil and Arava.  She is apprehensive to try methotrexate due to possible side effects as well as her father and sister both having adverse effects while taking methotrexate.  Indications, contraindications, potential side effects of Enbrel were discussed today in detail.  All questions were addressed and consent was obtained today.  We will apply for Enbrel through her insurance and once approved she will return to the office for administration of the first injection.  She will remain on Plaquenil as combination therapy for now.  She was advised to continue the prednisone taper as prescribed.  She will follow-up in the office in 6 to 8 weeks to assess her response to Enbrel.  Medication counseling:   TB Test: Pending  Hepatitis panel:  [id number] on 07/22/19  SPEP: normal 07/22/19 Immunoglobulins:  07/22/19  Chest x-ray: Patient had a previous chest x-ray and will have results forwarded to our office.  A future order for chest x-ray was placed today in case we do not have access to her previous chest x-ray.  Does patient have diagnosis of heart failure?  No  Counseled patient that Enbrel is a TNF blocking agent.  Reviewed Enbrel dose of 50 mg once weekly.  Counseled patient on purpose, proper use, and adverse effects of Enbrel.  Reviewed the most common adverse effects including infections, headache, and injection site reactions. Discussed that there is the possibility of an increased risk of malignancy but it is not well understood if this increased risk is due to the medication or the disease state.  Advised patient to get yearly dermatology exams due to risk of skin cancer.  Reviewed the importance of regular labs while on Enbrel therapy.  Advised patient to get standing labs one month after starting Enbrel then every 2 months.  Provided patient with standing lab orders.  Counseled patient that Enbrel should be held prior to scheduled surgery.  Counseled patient to avoid live vaccines while on Enbrel.  Advised patient to get annual influenza vaccine and the pneumococcal vaccine as needed.  Provided patient with medication education material and answered all questions.  Patient voiced understanding.  Patient consented to Enbrel.  Will upload consent into the media tab.  Reviewed storage instructions for Enbrel.  Advised initial injection must be administered in office.  Patient voiced understanding.    High risk medication use - Applying for Enbrel 50 mg sq injections once weekly.  She will remain on Plaquenil 200 mg 1 tablet by mouth twice daily.  Plan: COMPLETE METABOLIC PANEL WITH GFR, CBC with Differential/Platelet, QuantiFERON-TB Gold Plus CBC and CMP within normal limits on 01/25/2022.  CBC and CMP will be drawn today prior to starting Enbrel.  Her next lab work will be due in 1 month and every  3 months to monitor for drug toxicity.  Standing orders for CBC and CMP remain in place.  Order for TB gold was released today. PLQ eye exam: 10/20/2020 [facility]NL. [facility].  Patient is due to  update Plaquenil eye examination. Previous therapy: Arava-inadequate response.  Discussed the importance of holding Enbrel if she develops signs or symptoms of an infection and to resume once infection is completely cleared.  Screening for tuberculosis -Order for TB gold released today.  Plan: QuantiFERON-TB Gold Plus  Primary osteoarthritis of both hands: She has PIP and DIP thickening consistent with osteoarthritis of both hands.  Tenderness over bilateral third PIP joints.  Complete fist formation bilaterally.  She experiences intermittent pain and stiffness in both hands.  Primary osteoarthritis of both knees: MRI of right knee on 02/21/2022 revealed large suprapatellar joint effusion with synovitis and mild osteoarthritis.  She had a right knee joint cortisone injection performed on 02/28/2022 and a right knee joint aspiration and cortisone injection performed on 03/08/2022 by Dr. Roda Shutters.  She continues to have persistent discomfort and stiffness.  She is using a walker to assist with ambulation.  She was advised to continue the prednisone taper as prescribed.  We we will apply for Enbrel through her insurance.  She was advised to continue on Plaquenil as prescribed.    Primary osteoarthritis of both feet: She is not experiencing any discomfort in her feet at this time.  She is good range of motion of both ankle joints with no tenderness or inflammation.  No tenderness over MTP joints.  DDD (degenerative disc disease), lumbar: No midline spinal tenderness at this time.  No symptoms of radiculopathy.  Other medical conditions are listed as follows:  Other fatigue: Chronic, stable.  Closed Colles' fracture of right radius with routine healing, subsequent encounter: Healed.  RENAL CALCULUS  Family  history of rheumatoid arthritis    Orders: Orders Placed This Encounter  Procedures   DG Chest 2 View   COMPLETE METABOLIC PANEL WITH GFR   CBC with Differential/Platelet   QuantiFERON-TB Gold Plus   No orders of the defined types were placed in this encounter.   Follow-Up Instructions: Return in 8 weeks (on 05/09/2022) for Rheumatoid arthritis.   Gearldine Bienenstock, PA-C  Note - This record has been created using Dragon software.  Chart creation errors have been sought, but may not always  have been located. Such creation errors do not reflect on  the standard of medical care.

## 2022-03-14 ENCOUNTER — Encounter: Payer: Self-pay | Admitting: Physician Assistant

## 2022-03-14 ENCOUNTER — Ambulatory Visit: Payer: Commercial Managed Care - HMO | Admitting: Physician Assistant

## 2022-03-14 ENCOUNTER — Other Ambulatory Visit (HOSPITAL_COMMUNITY): Payer: Self-pay

## 2022-03-14 ENCOUNTER — Telehealth: Payer: Self-pay | Admitting: Pharmacist

## 2022-03-14 VITALS — BP 180/91 | HR 72 | Ht 66.0 in | Wt 195.0 lb

## 2022-03-14 DIAGNOSIS — M19072 Primary osteoarthritis, left ankle and foot: Secondary | ICD-10-CM

## 2022-03-14 DIAGNOSIS — M19071 Primary osteoarthritis, right ankle and foot: Secondary | ICD-10-CM

## 2022-03-14 DIAGNOSIS — M19041 Primary osteoarthritis, right hand: Secondary | ICD-10-CM | POA: Diagnosis not present

## 2022-03-14 DIAGNOSIS — Z79899 Other long term (current) drug therapy: Secondary | ICD-10-CM

## 2022-03-14 DIAGNOSIS — R5383 Other fatigue: Secondary | ICD-10-CM

## 2022-03-14 DIAGNOSIS — Z8261 Family history of arthritis: Secondary | ICD-10-CM

## 2022-03-14 DIAGNOSIS — M5136 Other intervertebral disc degeneration, lumbar region: Secondary | ICD-10-CM

## 2022-03-14 DIAGNOSIS — M0579 Rheumatoid arthritis with rheumatoid factor of multiple sites without organ or systems involvement: Secondary | ICD-10-CM

## 2022-03-14 DIAGNOSIS — S52531D Colles' fracture of right radius, subsequent encounter for closed fracture with routine healing: Secondary | ICD-10-CM

## 2022-03-14 DIAGNOSIS — N200 Calculus of kidney: Secondary | ICD-10-CM

## 2022-03-14 DIAGNOSIS — M170 Bilateral primary osteoarthritis of knee: Secondary | ICD-10-CM

## 2022-03-14 DIAGNOSIS — Z111 Encounter for screening for respiratory tuberculosis: Secondary | ICD-10-CM

## 2022-03-14 DIAGNOSIS — M19042 Primary osteoarthritis, left hand: Secondary | ICD-10-CM

## 2022-03-14 NOTE — Telephone Encounter (Signed)
Submitted a Prior Authorization request to [employer] for ENBREL via CoverMyMeds. Will update once we receive a response.  Key: Bronson Curb, PharmD, MPH, BCPS, CPP Clinical Pharmacist (Rheumatology and Pulmonology)

## 2022-03-14 NOTE — Patient Instructions (Signed)
Please call your PCP about your chest x-ray. If they have completed one in the past 10 years, they will need to fax this to [place]. Our fax is [phone]  If you have not completed a chest x-ray in the past you will need completed  Etanercept Injection What is this medication? ETANERCEPT (et a [employer]) treats autoimmune conditions, such as psoriasis and certain types of arthritis. It works by slowing down an overactive immune system. It belongs to a group of medications called TNF inhibitors. This medicine may be used for other purposes; ask your health care provider or pharmacist if you have questions. COMMON BRAND NAME(S): Enbrel What should I tell my care team before I take this medication? They need to know if you have any of these conditions: Bleeding disorder Cancer Diabetes Granulomatosis with polyangiitis Heart failure HIV or AIDS Immune system problems Infection such as tuberculosis (TB) or other bacterial, fungal or viral infections Liver disease Nervous system problems such as Guillain-Barre syndrome, multiple sclerosis or seizures Recent or upcoming vaccine An unusual or allergic reaction to etanercept, other medications, latex, rubber, food, dyes, or preservatives Pregnant or trying to get pregnant Breast-feeding How should I use this medication? The medication is injected under the skin. You will be taught how to prepare and give it. Take it as directed on the prescription label. Keep taking it unless your care team tells you stop. This medication comes with INSTRUCTIONS FOR USE. Ask your pharmacist for directions on how to use this medication. Read the information carefully. Talk to your pharmacist or care team if you have questions. If you use a pen, be sure to take off the outer needle cover before using the dose. It is important that you put your used needles and syringes in a special sharps container. Do not put them in a trash can. If you do not have a sharps  container, call your pharmacist or care team to get one. A special MedGuide will be given to you by the pharmacist with each prescription and refill. Be sure to read this information carefully each time. Talk to your care team about the use of this medication in children. While it may be prescribed for children as young as 73 years of age for selected conditions, precautions do apply. Overdosage: If you think you have taken too much of this medicine contact a poison control center or emergency room at once. NOTE: This medicine is only for you. Do not share this medicine with others. What if I miss a dose? If you miss a dose, take it as soon as you can. If it is almost time for your next dose, take only that dose. Do not take double or extra doses. What may interact with this medication? Do not take this medication with any of the following: Biologic medications such as adalimumab, certolizumab, golimumab, infliximab Live vaccines Rilonacept This medication may also interact with the following: Abatacept Anakinra Biologic medications such as anifrolumab, baricitinib, belimumab, canakinumab, natalizumab, rituximab, sarilumab, tocilizumab, tofacitinib, upadacitinib, vedolizumab Cyclophosphamide Sulfasalazine This list may not describe all possible interactions. Give your health care provider a list of all the medicines, herbs, non-prescription drugs, or dietary supplements you use. Also tell them if you smoke, drink alcohol, or use illegal drugs. Some items may interact with your medicine. What should I watch for while using this medication? Visit your care team for regular checks on your progress. Tell your care team if your symptoms do not start to get better or if  they get worse. This medication may increase your risk of getting an infection. Call your care team for advice if you get a fever, chills, sore throat, or other symptoms of a cold or flu. Do not treat yourself. Try to avoid being around  people who are sick. If you have not had the measles or chickenpox vaccines, tell your care team right away if you are around someone with these viruses. You will be tested for tuberculosis (TB) before you start this medication. If your care team prescribes any medication for TB, you should start taking the TB medication before starting this medication. Make sure to finish the full course of TB medication. Avoid taking medications that contain aspirin, acetaminophen, ibuprofen, naproxen, or ketoprofen unless instructed by your care team. These medications may hide fever. Talk to your care team about your risk of cancer. You may be more at risk for certain types of cancer if you take this medication. This medication can decrease the response to a vaccine. If you need to get vaccinated, tell your care team if you have received this medication. Extra booster doses may be needed. Talk to your care team to see if a different vaccination schedule is needed. What side effects may I notice from receiving this medication? Side effects that you should report to your care team as soon as possible: Allergic reactions--skin rash, itching, hives, swelling of the face, lips, tongue, or throat Body pain, tingling, or numbness Eye pain, change in vision, vision loss Heart failure--shortness of breath, swelling of the ankles, feet, or hands, sudden weight gain, unusual weakness or fatigue Infection--fever, chills, cough, sore throat, wounds that don't heal, pain or trouble when passing urine, general feeling of discomfort or being unwell Liver injury--right upper belly pain, loss of appetite, nausea, light-colored stool, dark yellow or brown urine, yellowing skin or eyes, unusual weakness or fatigue Low red blood cell level--unusual weakness or fatigue, dizziness, headache, trouble breathing Lupus-like syndrome--joint pain, swelling, or stiffness, butterfly-shaped rash on the face, rashes that get worse in the sun,  fever, unusual weakness or fatigue New or worsening psoriasis--rash with itchy, scaly patches Seizures Unusual bruising or bleeding Weakness in arms and legs Side effects that usually do not require medical attention (report to your care team if they continue or are bothersome): Headache Pain, redness, or irritation at injection site Sinus pain or pressure around the face or forehead This list may not describe all possible side effects. Call your doctor for medical advice about side effects. You may report side effects to FDA at 1-800-FDA-1088. Where should I keep my medication? Keep out of the reach of children and pets. See product for storage information. Each product may have different instructions. Get rid of any unused medication after the expiration date. To get rid of medications that are no longer needed or have expired: Take the medication to a medication take-back program. Check with your pharmacy or law enforcement to find a location. If you cannot return the medication, ask your pharmacist or care team how to get rid of this medication safely. NOTE: This sheet is a summary. It may not cover all possible information. If you have questions about this medicine, talk to your doctor, pharmacist, or health care provider.  2023 Elsevier/Gold Standard (2021-04-27 00:00:00)

## 2022-03-14 NOTE — Telephone Encounter (Signed)
Received notification from [employer] regarding a prior authorization for ENBREL. Authorization has been APPROVED from 03/14/22 to 03/13/22.   Per test claim, copay for 28 days supply is $2725.07. Insurance picks up [phone] of price  Patient can fill through [facility]: [phone]   Authorization # [id number]  Attempted to enroll patient into Enbrel savings card x 2 but runs into error. Will attempt to re-enroll tomorrow  Chesley Mires, PharmD, MPH, BCPS, CPP Clinical Pharmacist (Rheumatology and Pulmonology)

## 2022-03-14 NOTE — Progress Notes (Signed)
Pharmacy Note  Subjective: Patient presents today to the [facility] for follow up office visit.  Patient seen by the pharmacist for counseling on Enbrel for rheumatoid arthritis. She has previously taken leflunomide x 4 months with inadequate response. She currently takes hydroxychloroquine 200mg  twice daily  Diagnosis of heart failure: No  Objective:  CBC    Component Value Date/Time   WBC 6.5 01/25/2022 1009   RBC 4.94 01/25/2022 1009   HGB 14.9 01/25/2022 1009   HCT 44.4 01/25/2022 1009   PLT 273 01/25/2022 1009   MCV 89.9 01/25/2022 1009   MCH 30.2 01/25/2022 1009   MCHC 33.6 01/25/2022 1009   RDW 11.7 01/25/2022 1009   LYMPHSABS 1,846 01/25/2022 1009   EOSABS 169 01/25/2022 1009   BASOSABS 72 01/25/2022 1009     CMP     Component Value Date/Time   NA 141 01/25/2022 1009   K 4.2 01/25/2022 1009   CL 105 01/25/2022 1009   CO2 28 01/25/2022 1009   GLUCOSE 83 01/25/2022 1009   BUN 19 01/25/2022 1009   CREATININE 0.80 01/25/2022 1009   CALCIUM 9.7 01/25/2022 1009   PROT 6.8 01/25/2022 1009   AST 27 01/25/2022 1009   ALT 17 01/25/2022 1009   BILITOT 0.6 01/25/2022 1009   GFRNONAA 75 03/15/2021 1028   GFRAA 87 03/15/2021 1028     Baseline Immunosuppressant Therapy Labs TB GOLD    Latest Ref Rng & Units 07/22/2019    8:58 AM  Quantiferon TB Gold  Quantiferon TB Gold Plus NEGATIVE NEGATIVE    Hepatitis Panel    Latest Ref Rng & Units 07/22/2019    8:58 AM  Hepatitis  Hep B Surface Ag NON-REACTI NON-REACTIVE   Hep B IgM NON-REACTI NON-REACTIVE   Hep C Ab NON-REACTI NON-REACTIVE    HIV Lab Results  Component Value Date   HIV NON-REACTIVE 07/22/2019   Immunoglobulins    Latest Ref Rng & Units 07/22/2019    8:58 AM  Immunoglobulin Electrophoresis  IgA  47 - 310 mg/dL 13/12/2018   IgG 409 - 811 mg/dL 9,147   IgM 50 - 829 mg/dL 562    SPEP    Latest Ref Rng & Units 01/25/2022   10:09 AM  Serum Protein Electrophoresis  Total Protein 6.1 - 8.1  g/dL 6.8    03/27/2022 Lab Results  Component Value Date   G6PDH 13.0 07/22/2019   TPMT No results found for: "TPMT"   Chest x-ray: order placed today, pt will plan to complete at [facility]  Assessment/Plan:  Counseled patient that Enbrel is a TNF blocking agent.  Counseled patient on purpose, proper use, and adverse effects of Enbrel.  Reviewed the most common adverse effects including infections, headache, and injection site reactions.  Discussed that there is the possibility of an increased risk of malignancy including non-melanoma skin cancer but it is not well understood if this increased risk is due to the medication or the disease state.  Advised patient to get yearly dermatology exams due to risk of skin cancer.  Counseled patient that Enbrel should be held prior to scheduled surgery.  Counseled patient to avoid live vaccines while on Enbrel.  Recommend annual influenza, PCV 15 or PCV20 or Pneumovax 23, and Shingrix as indicated.  Reviewed the importance of regular labs while on Enbrel therapy.  Will monitor CBC and CMP 1 month after starting and then every 3 months routinely thereafter. Will monitor TB gold annually. Standing orders placed. Provided patient with  medication education material and answered all questions.  Patient consented to Enbrel.  Will upload consent into the media tab.  Reviewed storage instructions for Enbrel.  Advised initial injection must be administered in office.    Dermatology referral will not need to be placed as patient is establsihed with [facility] and will plan to make appt fo yearly skin checks  Patient will plan to call her PCP for her Chest xray. Advised that if they have completed, they will need to fax to [place]. If not completed, will need completed prior to starting Enbrel. She verbalized understanding  Chesley Mires, PharmD, MPH, BCPS, CPP Clinical Pharmacist (Rheumatology and Pulmonology)

## 2022-03-14 NOTE — Telephone Encounter (Signed)
Please start Enbrel Mini BIV.  Dose: 50mg  SQ every 7 days  Dx: Rheumatoid arthritis (M05.9)  Previously tried therapies: Leflunomide x 4 months with inadequate response  Current regimen: hydroxychloroquine  - inadequate response  Patient eligible for savings card once approved  Patient will need chest xray completed prior to starting Enbrel which she is aware about  , PharmD, MPH, BCPS, CPP Clinical Pharmacist (Rheumatology and Pulmonology)

## 2022-03-15 NOTE — Telephone Encounter (Signed)
Spoke with patient regarding Enbrel approval through insurance. Patient provided email for me to successfully enroll her into savings card. Debit card will arrive to her home in 2-3 business days.   Patient states she will nto be able to complete chest x-ray until after 03/24/22. She is going out of town from today through 03/24/22. Advised patient to complete once back in town so that we can get her started on Enbrel ASAP  Chesley Mires, PharmD, MPH, BCPS, CPP Clinical Pharmacist (Rheumatology and Pulmonology)

## 2022-03-15 NOTE — Progress Notes (Signed)
CMP WNL. WBC count is elevated. Absolute neutrophils are slightly elevated likely due to prednisone use.  Rest of CBC WNL.

## 2022-03-17 LAB — QUANTIFERON-TB GOLD PLUS
Mitogen-NIL: 1.83 IU/mL
NIL: 0.07 IU/mL
QuantiFERON-TB Gold Plus: NEGATIVE
TB1-NIL: <0 IU/mL
TB2-NIL: <0 IU/mL

## 2022-03-17 LAB — CBC WITH DIFFERENTIAL/PLATELET
Absolute Monocytes: 661 cells/uL (ref 200–950)
Basophils Absolute: 35 cells/uL (ref 0–200)
Basophils Relative: 0.3 %
Eosinophils Absolute: 0 cells/uL — ABNORMAL LOW (ref 15–500)
Eosinophils Relative: 0 %
HCT: 42.7 % (ref 35.0–45.0)
Hemoglobin: 14.4 g/dL (ref 11.7–15.5)
Lymphs Abs: 1659 cells/uL (ref 850–3900)
MCH: 30.3 pg (ref 27.0–33.0)
MCHC: 33.7 g/dL (ref 32.0–36.0)
MCV: 89.7 fL (ref 80.0–100.0)
MPV: 9.9 fL (ref 7.5–12.5)
Monocytes Relative: 5.7 %
Neutro Abs: 9245 cells/uL — ABNORMAL HIGH (ref 1500–7800)
Neutrophils Relative %: 79.7 %
Platelets: 343 10*3/uL (ref 140–400)
RBC: 4.76 10*6/uL (ref 3.80–5.10)
RDW: 12.1 % (ref 11.0–15.0)
Total Lymphocyte: 14.3 %
WBC: 11.6 10*3/uL — ABNORMAL HIGH (ref 3.8–10.8)

## 2022-03-17 LAB — COMPLETE METABOLIC PANEL WITH GFR
AG Ratio: 1.6 (calc) (ref 1.0–2.5)
ALT: 18 U/L (ref 6–29)
AST: 19 U/L (ref 10–35)
Albumin: 4.2 g/dL (ref 3.6–5.1)
Alkaline phosphatase (APISO): 87 U/L (ref 37–153)
BUN: 20 mg/dL (ref 7–25)
CO2: 27 mmol/L (ref 20–32)
Calcium: 10 mg/dL (ref 8.6–10.4)
Chloride: 106 mmol/L (ref 98–110)
Creat: 0.82 mg/dL (ref 0.50–1.05)
Globulin: 2.7 g/dL (calc) (ref 1.9–3.7)
Glucose, Bld: 86 mg/dL (ref 65–99)
Potassium: 4.2 mmol/L (ref 3.5–5.3)
Sodium: 140 mmol/L (ref 135–146)
Total Bilirubin: 0.5 mg/dL (ref 0.2–1.2)
Total Protein: 6.9 g/dL (ref 6.1–8.1)
eGFR: 82 mL/min/{1.73_m2} (ref >=60)

## 2022-03-20 NOTE — Progress Notes (Signed)
TB gold negative

## 2022-03-23 ENCOUNTER — Other Ambulatory Visit: Payer: Self-pay | Admitting: *Deleted

## 2022-03-23 NOTE — Telephone Encounter (Signed)
Patient contacted the office and left message stating she needs ref[facility] [place].   *Last taper was sent on 03/12/2022.    Attempted to contact the patient and unable to leave a message, voicemail is full .

## 2022-03-26 ENCOUNTER — Ambulatory Visit (HOSPITAL_COMMUNITY)
Admission: RE | Admit: 2022-03-26 | Discharge: 2022-03-26 | Disposition: A | Discharge status: Home / Self Care | Payer: Commercial Managed Care - HMO | Source: Ambulatory Visit | Attending: Physician Assistant | Admitting: Physician Assistant

## 2022-03-26 ENCOUNTER — Telehealth: Payer: Self-pay | Admitting: Rheumatology

## 2022-03-26 DIAGNOSIS — Z79899 Other long term (current) drug therapy: Secondary | ICD-10-CM | POA: Insufficient documentation

## 2022-03-26 DIAGNOSIS — M0579 Rheumatoid arthritis with rheumatoid factor of multiple sites without organ or systems involvement: Secondary | ICD-10-CM | POA: Insufficient documentation

## 2022-03-26 MED ORDER — PREDNISONE 5 MG PO TABS: ORAL_TABLET | ORAL | 0 refills | Status: DC

## 2022-03-26 NOTE — Telephone Encounter (Unsigned)
Patient called stating she would like to apply for a handicap placard.   Patient requested a return call.

## 2022-03-26 NOTE — Telephone Encounter (Signed)
Patient advised we will send in prednisone taper starting at 20 mg tapering by 5 mg every week.  Patient advised to take prednisone in the morning with food.  Avoid the use of NSAIDs.  Patient encouraged to have CXR ASAP so she can initiate enbrel. Patient states she is on her way now to the x-ray done now.

## 2022-03-26 NOTE — Telephone Encounter (Signed)
Ok to send in prednisone taper starting at 20 mg tapering by 5 mg every week.   Take prednisone in the morning with food.  Avoid the use of NSAIDs.  Please encourage the patient to have CXR ASAP so she can initiate enbrel.

## 2022-03-26 NOTE — Addendum Note (Signed)
Addended by: Henriette Combs on: 03/26/2022 12:49 PM   Modules accepted: Orders

## 2022-03-26 NOTE — Telephone Encounter (Signed)
Patient states she received a prednisone taper on 03/12/2022 and will complete it tomorrow. Patient states she is continuing to have pain in her right leg. Patient has not been able to get her chest x-ray so she can start the Enbrel. Patient states she will try to get that done today. Patient is requesting a refill on the Prednisone until she can get started on the Enbrel. Please advise.

## 2022-03-26 NOTE — Telephone Encounter (Signed)
Ok to provide temporary handicap placard.

## 2022-03-27 NOTE — Telephone Encounter (Signed)
Patient advised we have completed the handicap placard form. Patient advised it will be at the front desk for pick up. Patient expressed understanding.

## 2022-03-27 NOTE — Telephone Encounter (Signed)
Patient's CXR from 03/26/22 wnl. Pt confirms no onging infection, antibiotic use, or upcoming procedures/surgeries.  Enbrel new start scheduled for 03/29/22. She states she has not received Enbrel savings card but will check with her husband and take another look around the house. Provided her with [employer] card to request new card if she ultimately cannot located the savings card  Chesley Mires, PharmD, MPH, BCPS, CPP Clinical Pharmacist (Rheumatology and Pulmonology)

## 2022-03-27 NOTE — Progress Notes (Deleted)
Pharmacy Note  Subjective:   Patient presents to clinic today to receive first dose of Enbrel for rheumatoid arthritis. Patient currently takes hydroxychloroquine 200mg  twice daily.  She has previously taken leflunomide x 4 months with inadequate response.  Patient running a fever or have signs/symptoms of infection? {yes/no:20286}  Patient currently on antibiotics for the treatment of infection? {yes/no:20286}  Patient have any upcoming invasive procedures/surgeries? {yes/no:20286}  Objective: CMP     Component Value Date/Time   NA 140 03/14/2022 1042   K 4.2 03/14/2022 1042   CL 106 03/14/2022 1042   CO2 27 03/14/2022 1042   GLUCOSE 86 03/14/2022 1042   BUN 20 03/14/2022 1042   CREATININE 0.82 03/14/2022 1042   CALCIUM 10.0 03/14/2022 1042   PROT 6.9 03/14/2022 1042   AST 19 03/14/2022 1042   ALT 18 03/14/2022 1042   BILITOT 0.5 03/14/2022 1042   GFRNONAA 75 03/15/2021 1028   GFRAA 87 03/15/2021 1028    CBC    Component Value Date/Time   WBC 11.6 (H) 03/14/2022 1042   RBC 4.76 03/14/2022 1042   HGB 14.4 03/14/2022 1042   HCT 42.7 03/14/2022 1042   PLT 343 03/14/2022 1042   MCV 89.7 03/14/2022 1042   MCH 30.3 03/14/2022 1042   MCHC 33.7 03/14/2022 1042   RDW 12.1 03/14/2022 1042   LYMPHSABS 1,659 03/14/2022 1042   EOSABS 0 (L) 03/14/2022 1042   BASOSABS 35 03/14/2022 1042    Baseline Immunosuppressant Therapy Labs TB GOLD    Latest Ref Rng & Units 03/14/2022   10:42 AM  Quantiferon TB Gold  Quantiferon TB Gold Plus NEGATIVE NEGATIVE    Hepatitis Panel    Latest Ref Rng & Units 07/22/2019    8:58 AM  Hepatitis  Hep B Surface Ag NON-REACTI NON-REACTIVE   Hep B IgM NON-REACTI NON-REACTIVE   Hep C Ab NON-REACTI NON-REACTIVE    HIV Lab Results  Component Value Date   HIV NON-REACTIVE 07/22/2019   Immunoglobulins    Latest Ref Rng & Units 07/22/2019    8:58 AM  Immunoglobulin Electrophoresis  IgA  47 - 310 mg/dL 13/12/2018   IgG 326 - 712 mg/dL 4,580    IgM 50 - 998 mg/dL 338    SPEP    Latest Ref Rng & Units 03/14/2022   10:42 AM  Serum Protein Electrophoresis  Total Protein 6.1 - 8.1 g/dL 6.9    03/16/2022 Lab Results  Component Value Date   G6PDH 13.0 07/22/2019   TPMT No results found for: "TPMT"   Chest x-ray: 03/26/22 - No active cardiopulmonary disease  Assessment/Plan:  Demonstrated proper injection technique with Enbrel cartrisge and Autotouch injector demo device  Patient able to demonstrate proper injection technique using the teach back method.  Patient self injected in the **** with:  Sample Medication: *** NDC: *** Lot: *** Expiration: **  Patient tolerated well.  Observed for 30 mins in office for adverse reaction and ***.   Patient is to return in 1 month for labs and 6-8 weeks for follow-up appointment.  Standing orders placed.   Enbrel approved through insurance .   Rx sent to: {[facility]ty[facility]:[id number]}.  Patient provided with pharmacy phone number and advised to call later this week to schedule shipment to home.  Patient will continue Enbrel 50mg  SQ every 7 days days in combination with hydroxychloroquine 200mg  twice daily.  All questions encouraged and answered.  Instructed patient to call with any further questions or concerns.  05/27/22, PharmD, MPH, BCPS,  CPP Clinical Pharmacist (Rheumatology and Pulmonology)  03/27/2022 11:57 AM

## 2022-03-27 NOTE — Progress Notes (Signed)
CXR did not reveal any active cardiopulmonary disease. Ok to initiate enbrel.

## 2022-03-29 ENCOUNTER — Ambulatory Visit: Payer: Commercial Managed Care - HMO | Admitting: Pharmacist

## 2022-03-29 NOTE — Telephone Encounter (Signed)
Pt called stating she woke up with allergy type symptoms including runny nose, productive cough, and congestion. She states she believes it is allergies.  Will r/s Enbrel visit to Monday, 04/02/22.  Chesley Mires, PharmD, MPH, BCPS, CPP Clinical Pharmacist (Rheumatology and Pulmonology)

## 2022-04-02 ENCOUNTER — Ambulatory Visit (INDEPENDENT_AMBULATORY_CARE_PROVIDER_SITE_OTHER): Payer: Commercial Managed Care - HMO | Admitting: Pharmacist

## 2022-04-02 ENCOUNTER — Other Ambulatory Visit (HOSPITAL_COMMUNITY): Payer: Self-pay

## 2022-04-02 VITALS — BP 158/79 | HR 87

## 2022-04-02 DIAGNOSIS — Z7189 Other specified counseling: Secondary | ICD-10-CM

## 2022-04-02 DIAGNOSIS — Z79899 Other long term (current) drug therapy: Secondary | ICD-10-CM

## 2022-04-02 DIAGNOSIS — M0579 Rheumatoid arthritis with rheumatoid factor of multiple sites without organ or systems involvement: Secondary | ICD-10-CM

## 2022-04-02 MED ORDER — ENBREL MINI 50 MG/ML ~~LOC~~ SOCT
50.0000 mg | SUBCUTANEOUS | 0 refills | Status: DC
  Filled 2022-04-02: qty 12, fill #0
  Filled 2022-04-03: qty 8, 56d supply, fill #0
  Filled 2022-05-24: qty 4, 28d supply, fill #1

## 2022-04-02 NOTE — Progress Notes (Signed)
Pharmacy Note  Subjective:   Patient presents to clinic today to receive first dose of Enbrel for rheumatoid arthritis.  Patient currently takes hydroxychloroquine 200mg  twice daily.  Patient running a fever or have signs/symptoms of infection? No - symptoms from last week resolved  Patient currently on antibiotics for the treatment of infection? No  Patient have any upcoming invasive procedures/surgeries? No  Objective: CMP     Component Value Date/Time   NA 140 03/14/2022 1042   K 4.2 03/14/2022 1042   CL 106 03/14/2022 1042   CO2 27 03/14/2022 1042   GLUCOSE 86 03/14/2022 1042   BUN 20 03/14/2022 1042   CREATININE 0.82 03/14/2022 1042   CALCIUM 10.0 03/14/2022 1042   PROT 6.9 03/14/2022 1042   AST 19 03/14/2022 1042   ALT 18 03/14/2022 1042   BILITOT 0.5 03/14/2022 1042   GFRNONAA 75 03/15/2021 1028   GFRAA 87 03/15/2021 1028    CBC    Component Value Date/Time   WBC 11.6 (H) 03/14/2022 1042   RBC 4.76 03/14/2022 1042   HGB 14.4 03/14/2022 1042   HCT 42.7 03/14/2022 1042   PLT 343 03/14/2022 1042   MCV 89.7 03/14/2022 1042   MCH 30.3 03/14/2022 1042   MCHC 33.7 03/14/2022 1042   RDW 12.1 03/14/2022 1042   LYMPHSABS 1,659 03/14/2022 1042   EOSABS 0 (L) 03/14/2022 1042   BASOSABS 35 03/14/2022 1042    Baseline Immunosuppressant Therapy Labs TB GOLD    Latest Ref Rng & Units 03/14/2022   10:42 AM  Quantiferon TB Gold  Quantiferon TB Gold Plus NEGATIVE NEGATIVE    Hepatitis Panel    Latest Ref Rng & Units 07/22/2019    8:58 AM  Hepatitis  Hep B Surface Ag NON-REACTI NON-REACTIVE   Hep B IgM NON-REACTI NON-REACTIVE   Hep C Ab NON-REACTI NON-REACTIVE    HIV Lab Results  Component Value Date   HIV NON-REACTIVE 07/22/2019   Immunoglobulins    Latest Ref Rng & Units 07/22/2019    8:58 AM  Immunoglobulin Electrophoresis  IgA  47 - 310 mg/dL 13/12/2018   IgG 789 - 381 mg/dL 0,175   IgM 50 - 102 mg/dL 585    SPEP    Latest Ref Rng & Units 03/14/2022    10:42 AM  Serum Protein Electrophoresis  Total Protein 6.1 - 8.1 g/dL 6.9    03/16/2022 Lab Results  Component Value Date   G6PDH 13.0 07/22/2019   TPMT No results found for: "TPMT"   Chest x-ray: 03/27/22 - No active cardiopulmonary disease.  Assessment/Plan:  Demonstrated proper injection technique with Enbrel Autotouch injector device and cartridge demo device  Patient able to demonstrate proper injection technique using the teach back method. Patient self injected in the right thigh with:  Sample Medication: Enbrel Mini 50mg /mL NDC: 05/28/22 Lot: Expiration: 10/2023  Sample Medication: Enbrel Autotouch injector device (patient to keep) NDC: [id number] Lot: 11/2023 Expiration: 10/17/2024  Patient tolerated well.  Observed for 30 mins in office for adverse reaction and none noted.   Patient is to return in 1 month for labs and 6-8 weeks for follow-up appointment.  Standing orders placed.   Enbrel approved through insurance .   Rx sent to: [facility]: [phone] .  Patient provided with pharmacy phone number and advised that [facility] will call to set up first shipment. Pharmacy will reach out for subsequent shipments  Patient will continue Enbrel 50mg  SQ every 7 days in combination with  hydroxychloroquine 200mg  twice daily.  All questions encouraged and answered.  Instructed patient to call with any further questions or concerns.  , PharmD, MPH, BCPS, CPP Clinical Pharmacist (Rheumatology and Pulmonology)  04/02/2022 8:17 AM

## 2022-04-02 NOTE — Telephone Encounter (Signed)
Enbrel savings card: BIN: [id number] PCN: [id number] ID: [id number] Group: [id number]  Chesley Mires, PharmD, MPH, BCPS, CPP Clinical Pharmacist (Rheumatology and Pulmonology)

## 2022-04-02 NOTE — Patient Instructions (Signed)
Your next ENBREL dose is due on 04/09/22, 04/16/22, and every 7 days thereafter  CONTINUE hydroxychloroquine 200mg  twice daily  HOLD ENBREL if you have signs or symptoms of an infection. You can resume once you feel better or back to your baseline. HOLD ENBREL if you start antibiotics to treat an infection. HOLD ENBREL around the time of surgery/procedures. Your surgeon will be able to provide recommendations on when to hold BEFORE and when you are cleared to RESUME.  Pharmacy information: Your prescription will be shipped from [facility]. [facility] will reach out from [phone] for the FIRST shipment to onboard you. You will need to provide him with your Enbrel savings card information After that, the pharmacy will reach out. Their phone number is [phone] They will mail your medication to your home.  Labs are due in 1 month then every 3 months. Lab hours are from Monday to Thursday 1:30-4:30pm and Friday 1:30-4pm. You do not need an appointment if you come for labs during these times.  How to manage an injection site reaction: Remember the 5 C's: COUNTER - leave on the counter at least 30 minutes but up to overnight to bring medication to room temperature. This may help prevent stinging COLD - place something cold (like an ice gel pack or cold water bottle) on the injection site just before cleansing with alcohol. This may help reduce pain CLARITIN - use Claritin (generic name is loratadine) for the first two weeks of treatment or the day of, the day before, and the day after injecting. This will help to minimize injection site reactions CORTISONE CREAM - apply if injection site is irritated and itching CALL ME - if injection site reaction is bigger than the size of your fist, looks infected, blisters, or if you develop hives

## 2022-04-03 ENCOUNTER — Other Ambulatory Visit (HOSPITAL_COMMUNITY): Payer: Self-pay

## 2022-04-03 NOTE — Telephone Encounter (Signed)
Delivery instructions have been updated in [facility], medication will be shipped to patient's home address by 04/09/22.  Rx has been processed in [facility] and the patient has no copay at this time. Insurance allowed 62mL to be dispensed at once, however Rx had to be processed as a 63-day supply in order to go through. Note left in [facility] stating Rx is to be treated as an 84 day supply.

## 2022-04-17 ENCOUNTER — Other Ambulatory Visit: Payer: Self-pay | Admitting: Physician Assistant

## 2022-04-18 ENCOUNTER — Other Ambulatory Visit: Payer: Self-pay | Admitting: Rheumatology

## 2022-04-18 MED ORDER — PREDNISONE 5 MG PO TABS: ORAL_TABLET | ORAL | 0 refills | Status: DC

## 2022-04-18 NOTE — Telephone Encounter (Signed)
Ok to send in prednisone 20 mg tapering by 5 mg every 4 days.  Take prednisone in the morning with food and avoid the use of NSAIDs.  Continue enbrel and plaquenil as prescribed.

## 2022-04-18 NOTE — Telephone Encounter (Signed)
Patient left a voicemail requesting [facility] [place].

## 2022-04-18 NOTE — Telephone Encounter (Signed)
Patient advised Prednisone taper being sent to the pharmacy. Patient advised to take prednisone in the morning with food and avoid the use of NSAIDs.  Continue enbrel and plaquenil as prescribed.

## 2022-04-18 NOTE — Telephone Encounter (Signed)
Patient states she recently started on Enbrel. Patient has had 3 doses. Patient states she is having trouble with both of her legs still. Patient states she is having trouble walking. Patient is requesting a refill on Prednisone. Patient states the right leg is worse than the left. She states it is from her knee down. Patient states she has continued her PLQ. Please advise.

## 2022-04-27 NOTE — Progress Notes (Unsigned)
Office Visit Note  Patient: Courtney Kramer             Date of Birth: Aug 12, 1961           MRN: [id number]             PCP: Dema Severin, NP Referring: Dema Severin, NP Visit Date: 05/10/2022 Occupation: @GUAROCC @  Subjective:    History of Present Illness: Courtney Kramer is a 61 y.o. female with history of seropositive rheumatoid arthritis, osteoarthritis, and DDD. Enbrel 50 mg sq injections once weekly and Plaquenil 200 mg 1 tablet by mouth twice daily.   Patient was started on Enbrel on 04/02/22.    Initiated Enbrel on 04/02/22.  CBC and CMP updated on 03/14/22. Orders for CBC and CMP released today.  Her next lab work will be due in November and every 3 months.  TB gold negative on 03/14/22.  Discussed the importance of holding enbrel if she develops signs or symptoms of an infection and to resume once the infection has completely cleared.  PLQ eye exam: 10/20/2020 WNL. [facility] f/u 1 year. Overdue to update plaquenil eye exam.   Activities of Daily Living:  Patient reports morning stiffness for *** {minute/hour:19697}.   Patient {ACTIONS;DENIES/REPORTS:21021675::"Denies"} nocturnal pain.  Difficulty dressing/grooming: {ACTIONS;DENIES/REPORTS:21021675::"Denies"} Difficulty climbing stairs: {ACTIONS;DENIES/REPORTS:21021675::"Denies"} Difficulty getting out of chair: {ACTIONS;DENIES/REPORTS:21021675::"Denies"} Difficulty using hands for taps, buttons, cutlery, and/or writing: {ACTIONS;DENIES/REPORTS:21021675::"Denies"}  No Rheumatology ROS completed.   PMFS History:  Patient Active Problem List   Diagnosis Date Noted   Rheumatoid arthritis involving multiple sites with positive rheumatoid factor (HCC) 04/21/2020   High risk medication use 04/21/2020   Primary osteoarthritis of both hands 04/21/2020   Primary osteoarthritis of both knees 04/21/2020   Primary osteoarthritis of both feet 04/21/2020   DDD (degenerative disc disease), lumbar 04/21/2020   Fracture,  [place], right, closed 04/09/2018   Closed fracture of right distal radius and ulna, initial encounter 03/27/2018   Lumbar radiculopathy 09/19/2016   RENAL CALCULUS 05/04/2008   FLANK PAIN, RIGHT 05/04/2008    Past Medical History:  Diagnosis Date   Anxiety    Chronic back pain    Chronic back pain    GERD (gastroesophageal reflux disease)    History of cardiac murmur    07-26-2020  per pt happen after last pregnancy, work-up done was ok, and was told few years pcp no longer hears it   History of kidney stones    Kidney stones    OA (osteoarthritis)    hands , knees, feet   RA (rheumatoid arthritis) (HCC)    rheumotology--- Dr 13-05-2020. Deveshwar--- multiple sites seropositive/ positive rheumotoid factor,  taking plaquinel   Right ureteral calculus    Wears glasses     Family History  Problem Relation Age of Onset   Fibromyalgia Mother    Heart disease Mother    Macular degeneration Mother    Rheum arthritis Father    Rheum arthritis Sister    Cancer Brother    Breast cancer Neg Hx    Past Surgical History:  Procedure Laterality Date   COLONOSCOPY  2012   CYSTO/  RIGHT URETERAL STENT PLACEMENT  05-10-2008   CYSTOSCOPY W/ URETERAL STENT PLACEMENT Right 06/09/2014   Procedure: CYSTOSCOPY WITH RETROGRADE PYELOGRAM/URETERAL STENT PLACEMENT;  Surgeon: 06/11/2014, MD;  Location: [facility];  Service: Urology;  Laterality: Right;   CYSTOSCOPY W/ URETERAL STENT PLACEMENT Left 02/17/2020   Procedure: CYSTOSCOPY WITH RETROGRADE PYELOGRAM/URETERAL STENT  PLACEMENT;  Surgeon: Sebastian Ache, MD;  Location: [facility];  Service: Urology;  Laterality: Left;  45 MINS   CYSTOSCOPY/URETEROSCOPY/HOLMIUM LASER/STENT PLACEMENT Left 02/22/2020   Procedure: CYSTOSCOPY/LEFT URETEROSCOPY/HOLMIUM LASER/STENT EXCHANGE;  Surgeon: [name], MD;  Location: [facility];  Service: Urology;  Laterality: Left;   CYSTOSCOPY/URETEROSCOPY/HOLMIUM LASER/STENT PLACEMENT Right 07/28/2020   Procedure:  CYSTOSCOPY RIGHT URETEROSCOPY RIGHT RETROGRADE PYELOGRAM HOLMIUM LASER/STENT PLACEMENT;  Surgeon: [name], MD;  Location: [facility];  Service: Urology;  Laterality: Right;   EXTRACORPOREAL SHOCK WAVE LITHOTRIPSY Right 05-10-2008   KNEE ARTHROSCOPY Left 2009   OPEN REDUCTION INTERNAL FIXATION (ORIF) DISTAL RADIAL FRACTURE Right 03/27/2018   Procedure: OPEN REDUCTION INTERNAL FIXATION (ORIF) RIGHT DISTAL RADIUS FRACTURE;  Surgeon: Tarry Kos, MD;  Location: [facility];  Service: Orthopedics;  Laterality: Right;   WISDOM TOOTH EXTRACTION  AGE 14   Social History   Social History Narrative   Not on file    There is no immunization history on file for this patient.   Objective: Vital Signs: There were no vitals taken for this visit.   Physical Exam Vitals and nursing note reviewed.  Constitutional:      Appearance: She is well-developed.  HENT:     Head: Normocephalic and atraumatic.  Eyes:     Conjunctiva/sclera: Conjunctivae normal.  Cardiovascular:     Rate and Rhythm: Normal rate and regular rhythm.     Heart sounds: Normal heart sounds.  Pulmonary:     Effort: Pulmonary effort is normal.     Breath sounds: Normal breath sounds.  Abdominal:     General: Bowel sounds are normal.     Palpations: Abdomen is soft.  Musculoskeletal:     Cervical back: Normal range of motion.  Skin:    General: Skin is warm and dry.     Capillary Refill: Capillary refill takes less than 2 seconds.  Neurological:     Mental Status: She is alert and oriented to person, place, and time.  Psychiatric:        Behavior: Behavior normal.      Musculoskeletal Exam: ***  CDAI Exam: CDAI Score: -- Patient Global: --; Provider Global: -- Swollen: --; Tender: -- Joint Exam 05/10/2022   No joint exam has been documented for this visit   There is currently no information documented on the homunculus. Go to the Rheumatology activity and complete the homunculus  joint exam.  Investigation: No additional findings.  Imaging: No results found.  Recent Labs: Lab Results  Component Value Date   WBC 11.6 (H) 03/14/2022   HGB 14.4 03/14/2022   PLT 343 03/14/2022   NA 140 03/14/2022   K 4.2 03/14/2022   CL 106 03/14/2022   CO2 27 03/14/2022   GLUCOSE 86 03/14/2022   BUN 20 03/14/2022   CREATININE 0.82 03/14/2022   BILITOT 0.5 03/14/2022   AST 19 03/14/2022   ALT 18 03/14/2022   PROT 6.9 03/14/2022   CALCIUM 10.0 03/14/2022   GFRAA 87 03/15/2021   QFTBGOLDPLUS NEGATIVE 03/14/2022    Speciality Comments: PLQ eye exam: 10/20/2020 WNL. [facility] f/u 1 year. Arava- no reponse dcd after 4 months  Procedures:  No procedures performed Allergies: Sulfa antibiotics   Assessment / Plan:     Visit Diagnoses: No diagnosis found.  Orders: No orders of the defined types were placed in this encounter.  No orders of the defined types were placed in this encounter.   Face-to-face time spent with patient  was *** minutes. Greater than 50% of time was spent in counseling and coordination of care.  Follow-Up Instructions: No follow-ups on file.   Earnestine Mealing, CMA  Note - This record has been created using Editor, commissioning.  Chart creation errors have been sought, but may not always  have been located. Such creation errors do not reflect on  the standard of medical care.

## 2022-05-10 ENCOUNTER — Encounter: Payer: Self-pay | Admitting: Physician Assistant

## 2022-05-10 ENCOUNTER — Ambulatory Visit: Payer: Commercial Managed Care - HMO | Attending: Physician Assistant | Admitting: Physician Assistant

## 2022-05-10 VITALS — BP 165/103 | HR 82 | Resp 17 | Ht 66.0 in | Wt 197.2 lb

## 2022-05-10 DIAGNOSIS — M170 Bilateral primary osteoarthritis of knee: Secondary | ICD-10-CM | POA: Diagnosis not present

## 2022-05-10 DIAGNOSIS — M19072 Primary osteoarthritis, left ankle and foot: Secondary | ICD-10-CM

## 2022-05-10 DIAGNOSIS — M19041 Primary osteoarthritis, right hand: Secondary | ICD-10-CM

## 2022-05-10 DIAGNOSIS — Z79899 Other long term (current) drug therapy: Secondary | ICD-10-CM | POA: Diagnosis not present

## 2022-05-10 DIAGNOSIS — M5136 Other intervertebral disc degeneration, lumbar region: Secondary | ICD-10-CM

## 2022-05-10 DIAGNOSIS — N200 Calculus of kidney: Secondary | ICD-10-CM

## 2022-05-10 DIAGNOSIS — R5383 Other fatigue: Secondary | ICD-10-CM

## 2022-05-10 DIAGNOSIS — Z8261 Family history of arthritis: Secondary | ICD-10-CM

## 2022-05-10 DIAGNOSIS — M19042 Primary osteoarthritis, left hand: Secondary | ICD-10-CM

## 2022-05-10 DIAGNOSIS — M19071 Primary osteoarthritis, right ankle and foot: Secondary | ICD-10-CM

## 2022-05-10 DIAGNOSIS — M51369 Other intervertebral disc degeneration, lumbar region without mention of lumbar back pain or lower extremity pain: Secondary | ICD-10-CM

## 2022-05-10 DIAGNOSIS — S52531D Colles' fracture of right radius, subsequent encounter for closed fracture with routine healing: Secondary | ICD-10-CM

## 2022-05-10 DIAGNOSIS — M0579 Rheumatoid arthritis with rheumatoid factor of multiple sites without organ or systems involvement: Secondary | ICD-10-CM

## 2022-05-10 NOTE — Patient Instructions (Addendum)
Standing Labs We placed an order today for your standing lab work.   Please have your standing labs drawn in November and every 3 months   If possible, please have your labs drawn 2 weeks prior to your appointment so that the provider can discuss your results at your appointment.  Please note that you may see your imaging and lab results in MyChart before we have reviewed them. We may be awaiting multiple results to interpret others before contacting you. Please allow our office up to 72 hours to thoroughly review all of the results before contacting the office for clarification of your results.  We currently have open lab daily: Monday through Thursday from 1:30 PM-4:30 PM and Friday from 1:30 PM- 4:00 PM If possible, please come for your lab work on Monday, Thursday or Friday afternoons, as you may experience shorter wait times.   Effective July 18, 2022 the new lab hours will change to: Monday through Thursday from 1:30 PM-5:00 PM and Friday from 8:30 AM-12:00 PM If possible, please come for your lab work on Monday and Thursday afternoons, as you may experience shorter wait times.  Please be advised, all patients with office appointments requiring lab work will take precedent over walk-in lab work.    The office is located at [address], [place], [place] [address] No appointment is necessary.   Labs are drawn by [facility]. Please bring your co-pay at the time of your lab draw.  You may receive a bill from [facility] for your lab work.  Please note if you are on Hydroxychloroquine and and an order has been placed for a Hydroxychloroquine level, you will need to have it drawn 4 hours or more after your last dose.  If you wish to have your labs drawn at another location, please call the office 24 hours in advance to send orders.  If you have any questions regarding directions or hours of operation,  please call [phone].   As a reminder, please drink plenty of water prior  to coming for your lab work. Thanks!  If you have signs or symptoms of an infection or start antibiotics: First, call your PCP for workup of your infection. Hold your medication through the infection, until you complete your antibiotics, and until symptoms resolve if you take the following: Injectable medication (Actemra, Benlysta, Cimzia, Cosentyx, Enbrel, Humira, Kevzara, Orencia, Remicade, Simponi, Stelara, Taltz, Tremfya) Methotrexate Leflunomide (Arava) Mycophenolate (Cellcept) Osborne [place], or Rinvoq     Exercises for Chronic Knee Pain Chronic knee pain is pain that lasts longer than 3 months. For most people with chronic knee pain, exercise and weight loss is an important part of treatment. Your health care provider may want you to focus on: Strengthening the muscles that support your knee. This can take pressure off your knee and lessen pain. Preventing knee stiffness. Maintaining or increasing how far you can move your knee. Losing weight (if this applies) to take pressure off your knee, decrease your risk for injury, and make it easier for you to exercise. Your health care provider will help you develop an exercise program that matches your needs and physical abilities. Below are simple, low-impact exercises you can do at home. Ask your health care provider or a physical therapist how often you should do your exercise program and how many times to repeat each exercise. General safety tips Follow these safety tips for exercising with chronic knee pain: Get your health care provider's approval before doing any exercises. Start  slowly and stop any time an exercise causes pain. Do not exercise if your knee pain is flaring up. Warm up first. Stretching a cold muscle can cause an injury. Do 5-10 minutes of easy movement or light stretching before beginning your exercise routine. Do 5-10 minutes of low-impact activity (like walking or cycling) before starting strengthening  exercises. Contact your health care provider any time you have pain during or after exercising. Exercise may cause discomfort but should not be painful. It is normal to be a little stiff or sore after exercising.  Stretching and range-of-motion exercises Front thigh stretch  Stand up straight and support your body by holding on to a chair or resting one hand on a wall. With your legs straight and close together, bend one knee to lift your heel up toward your buttocks. Using one hand for support, grab your ankle with your free hand. Pull your foot up closer toward your buttocks to feel the stretch in front of your thigh. Hold the stretch for 30 seconds. Repeat __________ times. Complete this exercise __________ times a day. Back thigh stretch  Sit on the floor with your back straight and your legs out straight in front of you. Place the palms of your hands on the floor and slide them toward your feet as you bend at the hip. Try to touch your nose to your knees and feel the stretch in the back of your thighs. Hold for 30 seconds. Repeat __________ times. Complete this exercise __________ times a day. Calf stretch  Stand facing a wall. Place the palms of your hands flat against the wall, arms extended, and lean slightly against the wall. Get into a lunge position with one leg bent at the knee and the other leg stretched out straight behind you. Keep both feet facing the wall and increase the bend in your knee while keeping the heel of the other leg flat on the ground. You should feel the stretch in your calf. Hold for 30 seconds. Repeat __________ times. Complete this exercise __________ times a day. Strengthening exercises Straight leg lift Lie on your back with one knee bent and the other leg out straight. Slowly lift the straight leg without bending the knee. Lift until your foot is about 12 inches (30 cm) off the floor. Hold for 3-5 seconds and slowly lower your leg. Repeat  __________ times. Complete this exercise __________ times a day. Single leg dip Stand between two chairs and put both hands on the backs of the chairs for support. Extend one leg out straight with your body weight resting on the heel of the standing leg. Slowly bend your standing knee to dip your body to the level that is comfortable for you. Hold for 3-5 seconds. Repeat __________ times. Complete this exercise __________ times a day. Hamstring curls Stand straight, knees close together, facing the back of a chair. Hold on to the back of a chair with both hands. Keep one leg straight. Bend the other knee while bringing the heel up toward the buttock until the knee is bent at a 90-degree angle (right angle). Hold for 3-5 seconds. Repeat __________ times. Complete this exercise __________ times a day. Wall squat Stand straight with your back, hips, and head against a wall. Step forward one foot at a time with your back still against the wall. Your feet should be 2 feet (61 cm) from the wall at shoulder width. Keeping your back, hips, and head against the wall, slide down the wall  to as close of a sitting position as you can get. Hold for 5-10 seconds, then slowly slide back up. Repeat __________ times. Complete this exercise __________ times a day. Step-ups Step up with one foot onto a sturdy platform or stool that is about 6 inches (15 cm) high. Face sideways with one foot on the platform and one on the ground. Place all your weight on the platform foot and lift your body off the ground until your knee extends. Let your other leg hang free to the side. Hold for 3-5 seconds then slowly lower your weight down to the floor foot. Repeat __________ times. Complete this exercise __________ times a day. Contact a health care provider if: Your exercise causes pain. Your pain is worse after you exercise. Your pain prevents you from doing your exercises. This information is not intended to replace  advice given to you by your health care provider. Make sure you discuss any questions you have with your health care provider. Document Revised: 01/07/2020 Document Reviewed: 08/31/2019 Elsevier Patient Education  2023 [employer].

## 2022-05-11 LAB — COMPLETE METABOLIC PANEL WITH GFR
AG Ratio: 2.1 (calc) (ref 1.0–2.5)
ALT: 22 U/L (ref 6–29)
AST: 27 U/L (ref 10–35)
Albumin: 4.4 g/dL (ref 3.6–5.1)
Alkaline phosphatase (APISO): 94 U/L (ref 37–153)
BUN: 16 mg/dL (ref 7–25)
CO2: 25 mmol/L (ref 20–32)
Calcium: 9.4 mg/dL (ref 8.6–10.4)
Chloride: 106 mmol/L (ref 98–110)
Creat: 0.88 mg/dL (ref 0.50–1.05)
Globulin: 2.1 g/dL (calc) (ref 1.9–3.7)
Glucose, Bld: 83 mg/dL (ref 65–99)
Potassium: 4 mmol/L (ref 3.5–5.3)
Sodium: 141 mmol/L (ref 135–146)
Total Bilirubin: 0.6 mg/dL (ref 0.2–1.2)
Total Protein: 6.5 g/dL (ref 6.1–8.1)
eGFR: 75 mL/min/{1.73_m2} (ref >=60)

## 2022-05-11 LAB — CBC WITH DIFFERENTIAL/PLATELET
Absolute Monocytes: 597 cells/uL (ref 200–950)
Basophils Absolute: 70 cells/uL (ref 0–200)
Basophils Relative: 1.2 %
Eosinophils Absolute: 128 cells/uL (ref 15–500)
Eosinophils Relative: 2.2 %
HCT: 43.8 % (ref 35.0–45.0)
Hemoglobin: 15 g/dL (ref 11.7–15.5)
Lymphs Abs: 2018 cells/uL (ref 850–3900)
MCH: 30.9 pg (ref 27.0–33.0)
MCHC: 34.2 g/dL (ref 32.0–36.0)
MCV: 90.1 fL (ref 80.0–100.0)
MPV: 10.2 fL (ref 7.5–12.5)
Monocytes Relative: 10.3 %
Neutro Abs: 2987 cells/uL (ref 1500–7800)
Neutrophils Relative %: 51.5 %
Platelets: 315 10*3/uL (ref 140–400)
RBC: 4.86 10*6/uL (ref 3.80–5.10)
RDW: 12.1 % (ref 11.0–15.0)
Total Lymphocyte: 34.8 %
WBC: 5.8 10*3/uL (ref 3.8–10.8)

## 2022-05-11 NOTE — Progress Notes (Signed)
CBC and CMP WNL

## 2022-05-18 ENCOUNTER — Other Ambulatory Visit (HOSPITAL_COMMUNITY): Payer: Self-pay

## 2022-05-22 ENCOUNTER — Other Ambulatory Visit: Payer: Self-pay | Admitting: Physician Assistant

## 2022-05-22 ENCOUNTER — Other Ambulatory Visit (HOSPITAL_COMMUNITY): Payer: Self-pay

## 2022-05-22 DIAGNOSIS — Z79899 Other long term (current) drug therapy: Secondary | ICD-10-CM

## 2022-05-22 DIAGNOSIS — M0579 Rheumatoid arthritis with rheumatoid factor of multiple sites without organ or systems involvement: Secondary | ICD-10-CM

## 2022-05-24 ENCOUNTER — Other Ambulatory Visit (HOSPITAL_COMMUNITY): Payer: Self-pay

## 2022-05-28 ENCOUNTER — Telehealth: Payer: Self-pay | Admitting: *Deleted

## 2022-05-28 NOTE — Telephone Encounter (Signed)
Patient contacted the office stating that she saw her eye doctor, Dr. Nedra Hai with [facility] today. She states she had an eye exam but they were unable to do the OCT. Patient advised she will need to make an appointment with an ophthalmologist. Patient advised we have [facility] near [place] and she states she will try to give them a call and schedule an appointment.

## 2022-06-04 ENCOUNTER — Other Ambulatory Visit (HOSPITAL_COMMUNITY): Payer: Self-pay

## 2022-06-14 ENCOUNTER — Telehealth: Payer: Self-pay

## 2022-06-14 NOTE — Telephone Encounter (Signed)
Attempted to contact the patient and unable to leave a message, voicemail is full.  

## 2022-06-14 NOTE — Telephone Encounter (Signed)
Patient called stating she started a new box of Enbrel Mini 50 mg on 06/12/2022 and has had a reaction around the injection site of itching and rash. Please advise, thanks!

## 2022-06-14 NOTE — Telephone Encounter (Signed)
Advised patient to use hydrocortisone cream and Benadryl.  If she has recurrence of the rash with the next injection should notify us.

## 2022-06-14 NOTE — Telephone Encounter (Signed)
I called patient, patient verbalized understanding. 

## 2022-06-20 ENCOUNTER — Telehealth: Payer: Self-pay | Admitting: *Deleted

## 2022-06-20 NOTE — Telephone Encounter (Signed)
Attempted to contact patient unable to leave message, voice mail is full.

## 2022-06-20 NOTE — Telephone Encounter (Signed)
Patient contacted the office stating she gave her Enbrel injection on 06/19/2022. Patient states she has an reaction on her leg. Patient states it is not as bad as the previous reaction that she had on her abdomen. Patient states it does not itch as bad. Patient advised to use hydrocortisone cream and Benadryl as instructed last week. Patient states she is having swelling in her right knee. Patient states she is having trouble walking. Patient is asking about a prescription for prednisone. Last prednisone taper was 04/18/2022 and her follow up visit is 07/26/2022. She has been on Enbrel since 04/02/2022. Please advise.

## 2022-06-20 NOTE — Telephone Encounter (Signed)
Please advise patient that that she may benefit from cortisone injection to her knee joint and we can discuss other treatment options at the visit.

## 2022-06-21 NOTE — Telephone Encounter (Signed)
Advised patient that she may benefit from cortisone injection to her knee joint and we can discuss other treatment options at the visit. Patient has been scheduled for 06/28/2022 at 6 am.

## 2022-06-22 ENCOUNTER — Other Ambulatory Visit (HOSPITAL_COMMUNITY): Payer: Self-pay

## 2022-06-25 NOTE — Progress Notes (Addendum)
Office Visit Note  Patient: Courtney Kramer             Date of Birth: October 15, 1960           MRN: [id number]             PCP: Rhea Bleacher, NP Referring: Rhea Bleacher, NP Visit Date: 06/28/2022 Occupation: @GUAROCC @  Subjective:  Pain in the joints  History of Present Illness: Courtney Kramer is a 61 y.o. female we have seropositive rheumatoid arthritis, osteoarthritis and degenerative disc disease.  She has been on Enbrel 50 mg subcu weekly since July 2023.  She has been taking Enbrel and Plaquenil on a regular basis.  She continues to take Celebrex 200 mg on a daily basis.  She states she continues to have instability and discomfort in her knee joints.  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  She is cut back on her work.  She continues to have discomfort in her bilateral middle finger.  She is also noted some injection site reaction.  She states when she is outside the house she uses a walker as she feels unstable on her knee joints.  Activities of Daily Living:  Patient reports morning stiffness for 1-2 hours.   Patient Denies nocturnal pain.  Difficulty dressing/grooming: Denies Difficulty climbing stairs: Reports Difficulty getting out of chair: Denies Difficulty using hands for taps, buttons, cutlery, and/or writing: Denies  Review of Systems  Constitutional:  Positive for fatigue.  HENT:  Negative for mouth sores and mouth dryness.   Eyes:  Negative for dryness.  Respiratory:  Negative for shortness of breath.   Cardiovascular:  Negative for chest pain and palpitations.  Gastrointestinal:  Negative for blood in stool, constipation and diarrhea.  Endocrine: Negative for increased urination.  Genitourinary:  Negative for involuntary urination.  Musculoskeletal:  Positive for joint pain, joint pain, joint swelling, muscle weakness and morning stiffness. Negative for gait problem, myalgias, muscle tenderness and myalgias.  Skin:   Positive for rash. Negative for color change, hair loss and sensitivity to sunlight.  Allergic/Immunologic: Negative for susceptible to infections.  Neurological:  Negative for dizziness and headaches.  Hematological:  Negative for swollen glands.  Psychiatric/Behavioral:  Negative for depressed mood and sleep disturbance. The patient is not nervous/anxious.     PMFS History:  Patient Active Problem List   Diagnosis Date Noted   Rheumatoid arthritis involving multiple sites with positive rheumatoid factor ([place]) 04/21/2020   High risk medication use 04/21/2020   Primary osteoarthritis of both hands 04/21/2020   Primary osteoarthritis of both knees 04/21/2020   Primary osteoarthritis of both feet 04/21/2020   DDD (degenerative disc disease), lumbar 04/21/2020   Fracture, Colles, right, closed 04/09/2018   Closed fracture of right distal radius and ulna, initial encounter 03/27/2018   Lumbar radiculopathy 09/19/2016   RENAL CALCULUS 05/04/2008   FLANK PAIN, RIGHT 05/04/2008    Past Medical History:  Diagnosis Date   Anxiety    Chronic back pain    Chronic back pain    GERD (gastroesophageal reflux disease)    History of cardiac murmur    07-26-2020  per pt happen after last pregnancy, work-up done was ok, and was told few years pcp no longer hears it   History of kidney stones    Kidney stones    OA (osteoarthritis)    hands , knees, feet   RA (rheumatoid arthritis) ([place])  rheumotology--- Dr Chauncey Cruel. Lucius Wise--- multiple sites seropositive/ positive rheumotoid factor,  taking plaquinel   Right ureteral calculus    Wears glasses     Family History  Problem Relation Age of Onset   Fibromyalgia Mother    Heart disease Mother    Macular degeneration Mother    Rheum arthritis Father    Rheum arthritis Sister    Cancer Brother    Breast cancer Neg Hx    Past Surgical History:  Procedure Laterality Date   COLONOSCOPY  2012   CYSTO/  RIGHT URETERAL STENT PLACEMENT  05-10-2008    CYSTOSCOPY W/ URETERAL STENT PLACEMENT Right 06/09/2014   Procedure: CYSTOSCOPY WITH RETROGRADE PYELOGRAM/URETERAL STENT PLACEMENT;  Surgeon: Bernestine Amass, MD;  Location: [facility];  Service: Urology;  Laterality: Right;   CYSTOSCOPY W/ URETERAL STENT PLACEMENT Left 02/17/2020   Procedure: CYSTOSCOPY WITH RETROGRADE PYELOGRAM/URETERAL STENT PLACEMENT;  Surgeon: Alexis Frock, MD;  Location: [facility];  Service: Urology;  Laterality: Left;  45 MINS   CYSTOSCOPY/URETEROSCOPY/HOLMIUM LASER/STENT PLACEMENT Left 02/22/2020   Procedure: CYSTOSCOPY/LEFT URETEROSCOPY/HOLMIUM LASER/STENT EXCHANGE;  Surgeon: Irine Seal, MD;  Location: [facility];  Service: Urology;  Laterality: Left;   CYSTOSCOPY/URETEROSCOPY/HOLMIUM LASER/STENT PLACEMENT Right 07/28/2020   Procedure: CYSTOSCOPY RIGHT URETEROSCOPY RIGHT RETROGRADE PYELOGRAM HOLMIUM LASER/STENT PLACEMENT;  Surgeon: Irine Seal, MD;  Location: [facility];  Service: Urology;  Laterality: Right;   EXTRACORPOREAL SHOCK WAVE LITHOTRIPSY Right 05-10-2008   KNEE ARTHROSCOPY Left 2009   OPEN REDUCTION INTERNAL FIXATION (ORIF) DISTAL RADIAL FRACTURE Right 03/27/2018   Procedure: OPEN REDUCTION INTERNAL FIXATION (ORIF) RIGHT DISTAL RADIUS FRACTURE;  Surgeon: Leandrew Koyanagi, MD;  Location: [place];  Service: Orthopedics;  Laterality: Right;   WISDOM TOOTH EXTRACTION  AGE 45   Social History   Social History Narrative   Not on file    There is no immunization history on file for this patient.   Objective: Vital Signs: BP (!) 173/91 (BP Location: Left Arm, Patient Position: Sitting, Cuff Size: Normal)   Pulse 74   Resp 16   Ht 5\' 6"  (1.676 m)   Wt 195 lb 3.2 oz (88.5 kg)   BMI 31.51 kg/m    Physical Exam Vitals and nursing note reviewed.  Constitutional:      Appearance: She is well-developed.  HENT:     Head: Normocephalic and atraumatic.  Eyes:     Conjunctiva/sclera: Conjunctivae normal.  Cardiovascular:      Rate and Rhythm: Normal rate and regular rhythm.     Heart sounds: Normal heart sounds.  Pulmonary:     Effort: Pulmonary effort is normal.     Breath sounds: Normal breath sounds.  Abdominal:     General: Bowel sounds are normal.     Palpations: Abdomen is soft.  Musculoskeletal:     Cervical back: Normal range of motion.  Lymphadenopathy:     Cervical: No cervical adenopathy.  Skin:    General: Skin is warm and dry.     Capillary Refill: Capillary refill takes less than 2 seconds.  Neurological:     Mental Status: She is alert and oriented to person, place, and time.  Psychiatric:        Behavior: Behavior normal.      Musculoskeletal Exam: Cervical spine was in good range of motion.  Shoulder joints, elbow joints, wrist joints, MCPs PIPs and DIPs with good range of motion.  She had bilateral PIP and DIP thickening.  She had tenderness over bilateral  third PIP joints with no synovitis.  Hip joints and knee joints in good range of motion.  She has warmth on palpation of bilateral knee joints and moderate effusion in her right knee joint.  Ankles were in good range of motion without any tenderness.  There was no tenderness over MTPs.  CDAI Exam: CDAI Score: 6  Patient Global: 5 mm; Provider Global: 5 mm Swollen: 1 ; Tender: 4  Joint Exam 06/28/2022      Right  Left  PIP 3   Tender   Tender  Knee  Swollen Tender   Tender     Investigation: No additional findings.  Imaging: No results found.  Recent Labs: Lab Results  Component Value Date   WBC 5.8 05/10/2022   HGB 15.0 05/10/2022   PLT 315 05/10/2022   NA 141 05/10/2022   K 4.0 05/10/2022   CL 106 05/10/2022   CO2 25 05/10/2022   GLUCOSE 83 05/10/2022   BUN 16 05/10/2022   CREATININE 0.88 05/10/2022   BILITOT 0.6 05/10/2022   AST 27 05/10/2022   ALT 22 05/10/2022   PROT 6.5 05/10/2022   CALCIUM 9.4 05/10/2022   GFRAA 87 03/15/2021   QFTBGOLDPLUS NEGATIVE 03/14/2022    Speciality Comments: PLQ eye exam:  10/20/2020 WNL. [facility] f/u 1 year. Arava- no reponse dcd after 4 months Enbrel 04/02/22  Procedures:  Large Joint Inj: R knee on 06/28/2022 8:21 AM Indications: pain Details: 27 G 1.5 in needle, medial approach  Arthrogram: No  Medications: 60 mg triamcinolone acetonide 40 MG/ML; 5 mL lidocaine 1 % Aspirate: 19.5 mL clear; sent for lab analysis Outcome: tolerated well, no immediate complications Procedure, treatment alternatives, risks and benefits explained, specific risks discussed. Consent was given by the patient. Immediately prior to procedure a time out was called to verify the correct patient, procedure, equipment, support staff and site/side marked as required. Patient was prepped and draped in the usual sterile fashion.     Allergies: Sulfa antibiotics   Assessment / Plan:     Visit Diagnoses: Rheumatoid arthritis involving multiple sites with positive rheumatoid factor (HCC)-she has been on the combination of Plaquenil and Enbrel since July 2023.  She missed only 1 dose of Enbrel initially.  She continues to have pain and discomfort in her bilateral hands.  She also complains of discomfort and swelling in her bilateral knee joints.  She had warmth swelling and effusion in her right knee joint and warmth in her left knee joint.  She has been taking Celebrex once a day.  Noticed improvement in her symptoms since she has been on Enbrel.  She states she can walk again but she was having difficulty walking before.  She has noticed some injection site redness but no reaction.  She will return for a follow-up visit in 2 months.  If her symptoms still persist we may switch her to Humira.  High risk medication use - Enbrel 50 mg sq injections once weekly, Plaquenil 200 mg 1 tablet by mouth twice daily.  PLQ eye exam: 10/20/2020.  Labs obtained on May 10, 2022 CBC and CMP were normal.  She will need labs and November and every 3 months to monitor for drug toxicity.  Information on  immunization was placed in the AVS.  She was advised to hold Enbrel if she develops an infection.  Annual skin examination to screen for skin cancer was advised.  Use of sunscreen was advised.  Primary osteoarthritis of both hands-she is osteoarthritis  in her bilateral hands with bilateral DIP and PIP thickening.  Joint protection muscle strengthening was discussed.  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  Effusion, right knee -patient informed swelling and effusion in her right knee today.  After informed consent was obtained and side effects were discussed right knee joint was aspirated and injected with cortisone and lidocaine as described above.  Synovial fluid was sent for analysis.  Plan: Anaerobic and Aerobic Culture, Synovial Fluid Analysis, Complete.  I would like to see if the synovial fluid is inflammatory or noninflammatory.  Primary osteoarthritis of both knees -I reviewed her previous x-rays.  She has moderate osteoarthritis in bilateral knee joints.  She may benefit from viscosupplement injections in the future.  MRI of right knee on 02/21/2022 revealed large suprapatellar joint effusion with synovitis and mild osteoarthritis.  Primary osteoarthritis of both feet-she is osteoarthritic changes in her feet but no synovitis was noted.  DDD (degenerative disc disease), lumbar-she continues to have some lower back pain due to prolonged standing.  Essential hypertension-blood pressure was elevated today.  She states the blood pressure has been running high.  I advised her to monitor blood pressure closely and follow-up with PCP if her blood pressure stays elevated.  Other fatigue  Closed Colles' fracture of right radius with routine healing, subsequent encounter  RENAL CALCULUS  Family history of rheumatoid arthritis-father and sister.   Addendum:   Pseudogout -synovial fluid aspiration from this morning showed WBC count 1009.  Which is consistent with  inflammation.  The synovial fluid also showed calcium pyrophosphate crystals.  These findings are consistent with pseudogout.  I called patient and discussed the results with her.  She has been taking Celebrex 200 mg p.o. daily.  I advised her to continue Celebrex.  I also advised if she develops a flare in the future the Celebrex dose could be increased to twice daily as needed.  She can also come in for a cortisone injection for flares.  Patient voiced understanding.  Orders: Orders Placed This Encounter  Procedures   Large Joint Inj   Anaerobic and Aerobic Culture   Synovial Fluid Analysis, Complete   No orders of the defined types were placed in this encounter.    Follow-Up Instructions: Return in about 3 months (around 09/28/2022) for Rheumatoid arthritis, Osteoarthritis.   Bo Merino, MD  Note - This record has been created using Editor, commissioning.  Chart creation errors have been sought, but may not always  have been located. Such creation errors do not reflect on  the standard of medical care.

## 2022-06-26 ENCOUNTER — Other Ambulatory Visit (HOSPITAL_COMMUNITY): Payer: Self-pay

## 2022-06-28 ENCOUNTER — Encounter: Payer: Self-pay | Admitting: Rheumatology

## 2022-06-28 ENCOUNTER — Ambulatory Visit: Payer: Commercial Managed Care - HMO | Attending: Rheumatology | Admitting: Rheumatology

## 2022-06-28 ENCOUNTER — Other Ambulatory Visit (HOSPITAL_COMMUNITY): Payer: Self-pay

## 2022-06-28 ENCOUNTER — Other Ambulatory Visit: Payer: Self-pay | Admitting: Physician Assistant

## 2022-06-28 VITALS — BP 173/91 | HR 74 | Resp 16 | Ht 66.0 in | Wt 195.2 lb

## 2022-06-28 DIAGNOSIS — M5136 Other intervertebral disc degeneration, lumbar region: Secondary | ICD-10-CM

## 2022-06-28 DIAGNOSIS — R5383 Other fatigue: Secondary | ICD-10-CM

## 2022-06-28 DIAGNOSIS — N200 Calculus of kidney: Secondary | ICD-10-CM

## 2022-06-28 DIAGNOSIS — M19072 Primary osteoarthritis, left ankle and foot: Secondary | ICD-10-CM

## 2022-06-28 DIAGNOSIS — M0579 Rheumatoid arthritis with rheumatoid factor of multiple sites without organ or systems involvement: Secondary | ICD-10-CM

## 2022-06-28 DIAGNOSIS — Z79899 Other long term (current) drug therapy: Secondary | ICD-10-CM

## 2022-06-28 DIAGNOSIS — M25461 Effusion, right knee: Secondary | ICD-10-CM

## 2022-06-28 DIAGNOSIS — M19041 Primary osteoarthritis, right hand: Secondary | ICD-10-CM

## 2022-06-28 DIAGNOSIS — Z8261 Family history of arthritis: Secondary | ICD-10-CM

## 2022-06-28 DIAGNOSIS — M170 Bilateral primary osteoarthritis of knee: Secondary | ICD-10-CM

## 2022-06-28 DIAGNOSIS — I10 Essential (primary) hypertension: Secondary | ICD-10-CM

## 2022-06-28 DIAGNOSIS — S52531D Colles' fracture of right radius, subsequent encounter for closed fracture with routine healing: Secondary | ICD-10-CM

## 2022-06-28 DIAGNOSIS — M1120 Other chondrocalcinosis, unspecified site: Secondary | ICD-10-CM

## 2022-06-28 DIAGNOSIS — M19042 Primary osteoarthritis, left hand: Secondary | ICD-10-CM

## 2022-06-28 DIAGNOSIS — M19071 Primary osteoarthritis, right ankle and foot: Secondary | ICD-10-CM

## 2022-06-28 MED ORDER — ENBREL MINI 50 MG/ML ~~LOC~~ SOCT
50.0000 mg | SUBCUTANEOUS | 0 refills | Status: DC
  Filled 2022-06-28: qty 4, 28d supply, fill #0
  Filled 2022-07-19: qty 4, 28d supply, fill #1
  Filled 2022-08-17: qty 4, 28d supply, fill #2

## 2022-06-28 MED ORDER — LIDOCAINE HCL 1 % IJ SOLN
5.0000 mL | INTRAMUSCULAR | Status: AC | PRN
  Administered 2022-06-28: 5 mL

## 2022-06-28 MED ORDER — TRIAMCINOLONE ACETONIDE 40 MG/ML IJ SUSP
60.0000 mg | INTRAMUSCULAR | Status: AC | PRN
  Administered 2022-06-28: 60 mg via INTRA_ARTICULAR

## 2022-06-28 NOTE — Telephone Encounter (Signed)
Next Visit: 09/03/2022  Last Visit: 06/28/2022  Last Fill: 04/02/2022  [id number] arthritis involving multiple sites with positive rheumatoid factor  Current Dose per office note 06/28/2022: Enbrel 50 mg sq injections once weekly  Labs: 05/10/2022 CBC and CMP WNL  TB Gold: 03/14/2022 Neg    Okay to refill Enbrel?

## 2022-06-28 NOTE — Patient Instructions (Signed)
Standing Labs We placed an order today for your standing lab work.   Please have your standing labs drawn in November and every 3 months  Please have your labs drawn 2 weeks prior to your appointment so that the provider can discuss your lab results at your appointment.  Please note that you may see your imaging and lab results in MyChart before we have reviewed them. We will contact you once all results are reviewed. Please allow our office up to 72 hours to thoroughly review all of the results before contacting the office for clarification of your results.  Lab hours are: Monday through Thursday from 1:30 pm-4:30 pm and Friday from 1:30 pm- 4:00 pm  You may experience shorter wait times on Monday, Thursday or Friday afternoons,.   Effective July 16, 2022, new lab hours will be: Monday through Thursday from 8:00 am -12:30 pm and 1:00 pm-5:00 pm and Friday from 8:00 am-12:00 pm.  Please be advised, all patients with office appointments requiring lab work will take precedent over walk-in lab work.   Labs are drawn by [facility]. Please bring your co-pay at the time of your lab draw.  You may receive a bill from [facility] for your lab work.  Please note if you are on Hydroxychloroquine and and an order has been placed for a Hydroxychloroquine level, you will need to have it drawn 4 hours or more after your last dose.  If you wish to have your labs drawn at another location, please call the office 24 hours in advance so we can fax the orders.  The office is located at [address], [address], [place], [place] [address] No appointment is necessary.    If you have any questions regarding directions or hours of operation,  please call [phone].   As a reminder, please drink plenty of water prior to coming for your lab work. Thanks!   Vaccines You are taking a medication(s) that can suppress your immune system.  The following immunizations are recommended: Flu annually Covid-19  Td/Tdap  (tetanus, diphtheria, pertussis) every 10 years Pneumonia (Prevnar 15 then Pneumovax 23 at least 1 year apart.  Alternatively, can take Prevnar 20 without needing additional dose) Shingrix: 2 doses from 4 weeks to 6 months apart  Please check with your PCP to make sure you are up to date.   If you have signs or symptoms of an infection or start antibiotics: First, call your PCP for workup of your infection. Hold your medication through the infection, until you complete your antibiotics, and until symptoms resolve if you take the following: Injectable medication (Actemra, Benlysta, Cimzia, Cosentyx, Enbrel, Humira, Kevzara, Orencia, Remicade, Simponi, Stelara, Taltz, Tremfya) Methotrexate Leflunomide (Arava) Mycophenolate (Cellcept) Xeljanz, Olumiant, or Rinvoq  

## 2022-06-28 NOTE — Progress Notes (Signed)
The synovial fluid aspiration from this morning showed WBC count 1009.  Which is consistent with inflammation.  The synovial fluid also showed calcium pyrophosphate crystals.  These findings are consistent with pseudogout.  I called patient and discussed the results with her.  She has been taking Celebrex 200 mg p.o. daily.  I advised her to continue Celebrex.  I also advised if she develops a flare in the future the Celebrex dose could be increased to twice daily as needed.  She can also come in for a cortisone injection for flares.  Patient voiced understanding.

## 2022-06-29 NOTE — Progress Notes (Signed)
Synovial fluid culture is negative.

## 2022-07-03 LAB — ANAEROBIC AND AEROBIC CULTURE
AER RESULT:: NO GROWTH
MICRO NUMBER:: 14042464
MICRO NUMBER:: 14042465
SPECIMEN QUALITY:: ADEQUATE
SPECIMEN QUALITY:: ADEQUATE

## 2022-07-03 LAB — SYNOVIAL FLUID ANALYSIS, COMPLETE
Basophils, %: 0 %
Eosinophils-Synovial: 0 % (ref 0–2)
Lymphocytes-Synovial Fld: 52 % (ref 0–74)
Monocyte/Macrophage: 36 % (ref 0–69)
Neutrophil, Synovial: 4 % (ref 0–24)
Synoviocytes, %: 8 % (ref 0–15)
WBC, Synovial: 1009 cells/uL — ABNORMAL HIGH (ref <150)

## 2022-07-12 ENCOUNTER — Ambulatory Visit: Payer: Commercial Managed Care - HMO | Admitting: Physician Assistant

## 2022-07-19 ENCOUNTER — Other Ambulatory Visit (HOSPITAL_COMMUNITY): Payer: Self-pay

## 2022-07-23 ENCOUNTER — Telehealth: Payer: Self-pay | Admitting: Physical Medicine and Rehabilitation

## 2022-07-23 ENCOUNTER — Other Ambulatory Visit: Payer: Self-pay | Admitting: Physician Assistant

## 2022-07-23 DIAGNOSIS — M0579 Rheumatoid arthritis with rheumatoid factor of multiple sites without organ or systems involvement: Secondary | ICD-10-CM

## 2022-07-23 NOTE — Telephone Encounter (Signed)
Next Visit: 09/03/2022   Last Visit: 06/28/2022   Last Fill: 03/12/2022  [id number] arthritis involving multiple sites with positive rheumatoid factor   Current Dose per office note 06/28/2022: Plaquenil 200 mg 1 tablet by mouth twice daily   PLQ eye exam: 07/12/2022 WNL.   Labs: 05/10/2022 CBC and CMP WNL   Okay to refill PLQ?

## 2022-07-23 NOTE — Telephone Encounter (Signed)
Patient called requesting injection in back please advise

## 2022-07-25 ENCOUNTER — Ambulatory Visit (INDEPENDENT_AMBULATORY_CARE_PROVIDER_SITE_OTHER): Payer: Commercial Managed Care - HMO | Admitting: Physical Medicine and Rehabilitation

## 2022-07-25 ENCOUNTER — Encounter: Payer: Self-pay | Admitting: Physical Medicine and Rehabilitation

## 2022-07-25 ENCOUNTER — Other Ambulatory Visit (HOSPITAL_COMMUNITY): Payer: Self-pay

## 2022-07-25 DIAGNOSIS — M5416 Radiculopathy, lumbar region: Secondary | ICD-10-CM | POA: Diagnosis not present

## 2022-07-25 DIAGNOSIS — M5116 Intervertebral disc disorders with radiculopathy, lumbar region: Secondary | ICD-10-CM | POA: Diagnosis not present

## 2022-07-25 DIAGNOSIS — M47816 Spondylosis without myelopathy or radiculopathy, lumbar region: Secondary | ICD-10-CM

## 2022-07-25 DIAGNOSIS — M4726 Other spondylosis with radiculopathy, lumbar region: Secondary | ICD-10-CM | POA: Diagnosis not present

## 2022-07-25 MED ORDER — CYCLOBENZAPRINE HCL 10 MG PO TABS: 10.0000 mg | ORAL_TABLET | Freq: Three times a day (TID) | ORAL | 0 refills | Status: AC | PRN

## 2022-07-25 NOTE — Telephone Encounter (Signed)
Patient was scheduled for 10/04/21

## 2022-07-25 NOTE — Progress Notes (Signed)
Numeric Pain Rating Scale and Functional Assessment Average Pain 7   In the last MONTH (on 0-10 scale) has pain interfered with the following?  1. General activity like being  able to carry out your everyday physical activities such as walking, climbing stairs, carrying groceries, or moving a chair?  Rating(10)   Lower right side back pain that radiates down leg

## 2022-07-25 NOTE — Progress Notes (Signed)
[name] - 61 y.o. female MRN [id number]  Date of birth: 08-27-1961  Office Visit Note: Visit Date: 07/25/2022 PCP: Rhea Bleacher, NP Referred by: Rhea Bleacher, NP  Subjective: Chief Complaint  Patient presents with   Lower Back - Pain   HPI: Courtney Kramer is a 61 y.o. female who comes in today for evaluation of chronic, worsening and severe right-sided lower back pain radiating to buttock and down right lateral leg to foot. Patient was initially referred to [place] by Dr. Teresa Coombs in 2017. Pain ongoing for several years and worsens with movement and activity.  She describes her pain as a sharp, sore and aching sensation, currently rates as 8 out of 10.  Some relief of pain with home exercise regimen, rest and use of medications. Does take tramadol intermittently for moderate/severe pain prescribed by Dr. Eduard Roux. She did attend regimen of formal physical therapy several years ago, no relief of pain with these treatments. Lumbar MRI imaging from 2019 exhibits central and rightward protrusion at L4-L5, right L4 and L5 neural impingement is likely. Regression of previously noted extrusion L3-L4 on the right. No high grade spinal canal stenosis noted. History of lumbar epidural steroid injections in our office several years ago, reports significant relief of pain with these treatments. Patient currently working as Theme park manager, states pain is interfering with job duties. Patient denies focal weakness, numbness and tingling. Patient denies recent trauma or falls.    Review of Systems  Musculoskeletal:  Positive for back pain.  Neurological:  Negative for tingling, sensory change, focal weakness and weakness.  All other systems reviewed and are negative.  Otherwise per HPI.  Assessment & Plan: Visit Diagnoses:    ICD-10-CM   1. Lumbar radiculopathy  M54.16     2. Other spondylosis with radiculopathy, lumbar region  M47.26     3. Intervertebral disc disorders with  radiculopathy, lumbar region  M51.16     4. Facet hypertrophy of lumbar region  M47.816        Plan: Findings:  Chronic, worsening and severe right-sided lower back pain radiating to buttock and down right lateral leg to foot.  Patient continues to have severe pain despite good conservative therapy such as formal physical therapy, home exercise regimen, rest and use of medications.  Patient's clinical presentation and exam are consistent with L5 nerve pattern. There is central and rightward protrusion at the level of L4-L5 on previous lumbar MRI imaging from 2019. Significant relief of pain with previous lumbar epidural steroid injections in the past. Next step is to perform diagnostic and hopefully therapeutic right L5 transforaminal epidural steroid injection under fluoroscopic guidance. If good relief of pain with injection we can repeat infrequently as needed. I did prescribe Flexeril for her to take as needed as this medication has helped her significantly in the past. If her pain persists post injection we did discuss possibility of obtaining new lumbar MRI imaging and possible surgical consult. Patient is adamant about avoiding surgical intervention. Patient encouraged to remain active as tolerated. No red flag symptoms noted upon exam today.     Meds & Orders: No orders of the defined types were placed in this encounter.  No orders of the defined types were placed in this encounter.   Follow-up: Return for Right L5 transforaminal epidural steorid injection.   Procedures: No procedures performed      Clinical History: Narrative & Impression CLINICAL DATA:  Low back and LEFT leg pain.  Recent  fall.   EXAM: MRI LUMBAR SPINE WITHOUT CONTRAST   TECHNIQUE: Multiplanar, multisequence MR imaging of the lumbar spine was performed. No intravenous contrast was administered.   COMPARISON:  08/29/2016.   FINDINGS: Segmentation:  Standard.   Alignment:  Anatomic.   Vertebrae:  No  worrisome osseous lesion.   Conus medullaris and cauda equina: Conus extends to the L1 level. Conus and cauda equina appear normal.   Paraspinal and other soft tissues: Unremarkable.   Disc levels:   L1-L2:  Slight disc narrowing and desiccation.  No impingement.   L2-L3:  Disc space narrowing.  Annular bulge.  No impingement.   L3-L4: Disc desiccation. Subligamentous protrusion. Facet arthropathy. No impingement.   L4-L5: Disc desiccation. Central and rightward protrusion. Foraminal protrusion, also to the RIGHT. Facet arthropathy. RIGHT L5 and RIGHT L4 neural impingement are likely.   L5-S1: Unremarkable disc space. Facet arthropathy. No impingement.   Compared with priors, the findings at L4-5 have progressed. The findings at L3-4 are improved.   IMPRESSION: Central and rightward protrusion at L4-5. RIGHT L4 and RIGHT L5 neural impingement likely.   Regression of previously noted extrusion L3-L4 on the RIGHT.   A dominant LEFT-sided compressive lesion is not established. Minor disc and facet pathology elsewhere, not clearly compressive.     Electronically Signed   By: Elsie Stain M.D.   On: 08/20/2018 11:29   She reports that she has never smoked. She has never been exposed to tobacco smoke. She has never used smokeless tobacco. No results for input(s): "HGBA1C", "LABURIC" in the last 8760 hours.  Objective:  VS:  HT:    WT:   BMI:     BP:   HR: bpm  TEMP: ( )  RESP:  Physical Exam Vitals and nursing note reviewed.  HENT:     Head: Normocephalic and atraumatic.     Right Ear: External ear normal.     Left Ear: External ear normal.     Nose: Nose normal.     Mouth/Throat:     Mouth: Mucous membranes are moist.  Eyes:     Extraocular Movements: Extraocular movements intact.  Cardiovascular:     Rate and Rhythm: Normal rate.     Pulses: Normal pulses.  Pulmonary:     Effort: Pulmonary effort is normal.  Abdominal:     General: Abdomen is flat. There  is no distension.  Musculoskeletal:        General: Tenderness present.     Cervical back: Normal range of motion.     Comments: Pt rises from seated position to standing without difficulty. Good lumbar range of motion. Strong distal strength without clonus, no pain upon palpation of greater trochanters. Sensation intact bilaterally. Dysesthesias noted to right L5 dermatome. Walks independently, gait steady.   Skin:    General: Skin is warm and dry.     Capillary Refill: Capillary refill takes less than 2 seconds.  Neurological:     General: No focal deficit present.     Mental Status: She is alert and oriented to person, place, and time.  Psychiatric:        Mood and Affect: Mood normal.        Behavior: Behavior normal.     Ortho Exam  Imaging: No results found.  Past Medical/Family/Surgical/Social History: Medications & Allergies reviewed per EMR, new medications updated. Patient Active Problem List   Diagnosis Date Noted   Rheumatoid arthritis involving multiple sites with positive rheumatoid factor (HCC) 04/21/2020  High risk medication use 04/21/2020   Primary osteoarthritis of both hands 04/21/2020   Primary osteoarthritis of both knees 04/21/2020   Primary osteoarthritis of both feet 04/21/2020   DDD (degenerative disc disease), lumbar 04/21/2020   Fracture, [place], right, closed 04/09/2018   Closed fracture of right distal radius and ulna, initial encounter 03/27/2018   Lumbar radiculopathy 09/19/2016   RENAL CALCULUS 05/04/2008   FLANK PAIN, RIGHT 05/04/2008   Past Medical History:  Diagnosis Date   Anxiety    Chronic back pain    Chronic back pain    GERD (gastroesophageal reflux disease)    History of cardiac murmur    07-26-2020  per pt happen after last pregnancy, work-up done was ok, and was told few years pcp no longer hears it   History of kidney stones    Kidney stones    OA (osteoarthritis)    hands , knees, feet   RA (rheumatoid arthritis) ([place])     rheumotology--- Dr Chauncey Cruel. Deveshwar--- multiple sites seropositive/ positive rheumotoid factor,  taking plaquinel   Right ureteral calculus    Wears glasses    Family History  Problem Relation Age of Onset   Fibromyalgia Mother    Heart disease Mother    Macular degeneration Mother    Rheum arthritis Father    Rheum arthritis Sister    Cancer Brother    Breast cancer Neg Hx    Past Surgical History:  Procedure Laterality Date   COLONOSCOPY  2012   CYSTO/  RIGHT URETERAL STENT PLACEMENT  05-10-2008   CYSTOSCOPY W/ URETERAL STENT PLACEMENT Right 06/09/2014   Procedure: CYSTOSCOPY WITH RETROGRADE PYELOGRAM/URETERAL STENT PLACEMENT;  Surgeon: Bernestine Amass, MD;  Location[facility];  Service: Urology;  Laterality: Right;   CYSTOSCOPY W/ URETERAL STENT PLACEMENT Left 02/17/2020   Procedure: CYSTOSCOPY WITH RETROGRADE PYELOGRAM/URETERAL STENT PLACEMENT;  Surgeon: Alexis Frock, MD;  Location: [facility];  Service: Urology;  Laterality: Left;  45 MINS   CYSTOSCOPY/URETEROSCOPY/HOLMIUM LASER/STENT PLACEMENT Left 02/22/2020   Procedure: CYSTOSCOPY/LEFT URETEROSCOPY/HOLMIUM LASER/STENT EXCHANGE;  Surgeon: Irine Seal, MD;  Location: [facility];  Service: Urology;  Laterality: Left;   CYSTOSCOPY/URETEROSCOPY/HOLMIUM LASER/STENT PLACEMENT Right 07/28/2020   Procedure: CYSTOSCOPY RIGHT URETEROSCOPY RIGHT RETROGRADE PYELOGRAM HOLMIUM LASER/STENT PLACEMENT;  Surgeon: Irine Seal, MD;  Location[facility];  Service: Urology;  Laterality: Right;   EXTRACORPOREAL SHOCK WAVE LITHOTRIPSY Right 05-10-2008   KNEE ARTHROSCOPY Left 2009   OPEN REDUCTION INTERNAL FIXATION (ORIF) DISTAL RADIAL FRACTURE Right 03/27/2018   Procedure: OPEN REDUCTION INTERNAL FIXATION (ORIF) RIGHT DISTAL RADIUS FRACTURE;  Surgeon: Leandrew Koyanagi, MD;  Location: [place];  Service: Orthopedics;  Laterality: Right;   WISDOM TOOTH EXTRACTION  AGE 35   Social History   Occupational History    Not on file  Tobacco Use   Smoking status: Never    Passive exposure: Never   Smokeless tobacco: Never  Vaping Use   Vaping Use: Never used  Substance and Sexual Activity   Alcohol use: Yes    Comment: OCCASONAL   Drug use: Never   Sexual activity: Not on file

## 2022-07-26 ENCOUNTER — Other Ambulatory Visit (HOSPITAL_COMMUNITY): Payer: Self-pay

## 2022-07-26 ENCOUNTER — Ambulatory Visit: Payer: Commercial Managed Care - HMO | Admitting: Rheumatology

## 2022-07-26 ENCOUNTER — Telehealth: Payer: Self-pay | Admitting: Physical Medicine and Rehabilitation

## 2022-07-26 NOTE — Telephone Encounter (Signed)
Pt called requesting a call back. Pt states calling to set an appt. Please call pt at [phone].

## 2022-07-26 NOTE — Telephone Encounter (Signed)
IC scheduled 

## 2022-08-06 ENCOUNTER — Ambulatory Visit: Payer: Commercial Managed Care - HMO | Admitting: Physical Medicine and Rehabilitation

## 2022-08-06 ENCOUNTER — Ambulatory Visit: Payer: Self-pay

## 2022-08-06 VITALS — BP 174/97 | HR 79

## 2022-08-06 DIAGNOSIS — M5416 Radiculopathy, lumbar region: Secondary | ICD-10-CM | POA: Diagnosis not present

## 2022-08-06 MED ORDER — METHYLPREDNISOLONE ACETATE 80 MG/ML IJ SUSP
40.0000 mg | Freq: Once | INTRAMUSCULAR | Status: AC
  Administered 2022-08-06: 40 mg

## 2022-08-06 NOTE — Progress Notes (Signed)
Numeric Pain Rating Scale and Functional Assessment Average Pain 5   In the last MONTH (on 0-10 scale) has pain interfered with the following?  1. General activity like being  able to carry out your everyday physical activities such as walking, climbing stairs, carrying groceries, or moving a chair?  Rating(10)   +Driver, -BT, -Dye Allergies.   Right L5 TF

## 2022-08-06 NOTE — Patient Instructions (Signed)

## 2022-08-14 NOTE — Progress Notes (Signed)
Courtney Kramer - 61 y.o. female MRN [id number]  Date of birth: [birth year]  Office Visit Note: Visit Date: 08/06/2022 PCP: Erskine Emery, NP Referred by: Erskine Emery, NP  Subjective: Chief Complaint  Patient presents with   Lower Back - Pain   HPI:  Courtney Kramer is a 61 y.o. female who comes in today at the request of Ellin Goodie, FNP for planned Right L5-S1 Lumbar Transforaminal epidural steroid injection with fluoroscopic guidance.  The patient has failed conservative care including home exercise, medications, time and activity modification.  This injection will be diagnostic and hopefully therapeutic.  Please see requesting physician notes for further details and justification.   ROS Otherwise per HPI.  Assessment & Plan: Visit Diagnoses:    ICD-10-CM   1. Lumbar radiculopathy  M54.16 XR C-ARM NO REPORT    Epidural Steroid injection    methylPREDNISolone acetate (DEPO-MEDROL) injection 40 mg      Plan: No additional findings.   Meds & Orders:  Meds ordered this encounter  Medications   methylPREDNISolone acetate (DEPO-MEDROL) injection 40 mg    Orders Placed This Encounter  Procedures   XR C-ARM NO REPORT   Epidural Steroid injection    Follow-up: Return for visit to requesting provider as needed.   Procedures: No procedures performed  Lumbosacral Transforaminal Epidural Steroid Injection - Sub-Pedicular Approach with Fluoroscopic Guidance  Patient: Courtney Kramer      Date of Birth: 1961/01/29 MRN: [id number] PCP: Erskine Emery, NP      Visit Date: 08/06/2022   Universal Protocol:    Date/Time: 08/06/2022  Consent Given By: the patient  Position: PRONE  Additional Comments: Vital signs were monitored before and after the procedure. Patient was prepped and draped in the usual sterile fashion. The correct patient, procedure, and site was verified.   Injection Procedure Details:   Procedure diagnoses: Lumbar radiculopathy [M54.16]     Meds Administered:  Meds ordered this encounter  Medications   methylPREDNISolone acetate (DEPO-MEDROL) injection 40 mg    Laterality: Right  Location/Site: L5  Needle:5.0 in., 22 ga.  Short bevel or Quincke spinal needle  Needle Placement: Transforaminal  Findings:    -Comments: Excellent flow of contrast along the nerve, nerve root and into the epidural space.  Procedure Details: After squaring off the end-plates to get a true AP view, the C-arm was positioned so that an oblique view of the foramen as noted above was visualized. The target area is just inferior to the "nose of the scotty dog" or sub pedicular. The soft tissues overlying this structure were infiltrated with 2-3 ml. of 1% Lidocaine without Epinephrine.  The spinal needle was inserted toward the target using a "trajectory" view along the fluoroscope beam.  Under AP and lateral visualization, the needle was advanced so it did not puncture dura and was located close the 6 O'Clock position of the pedical in AP tracterory. Biplanar projections were used to confirm position. Aspiration was confirmed to be negative for CSF and/or blood. A 1-2 ml. volume of Isovue-250 was injected and flow of contrast was noted at each level. Radiographs were obtained for documentation purposes.   After attaining the desired flow of contrast documented above, a 0.5 to 1.0 ml test dose of 0.25% Marcaine was injected into each respective transforaminal space.  The patient was observed for 90 seconds post injection.  After no sensory deficits were reported, and normal lower extremity motor function was noted,   the above injectate  was administered so that equal amounts of the injectate were placed at each foramen (level) into the transforaminal epidural space.   Additional Comments:  The patient tolerated the procedure well Dressing: 2 x 2 sterile gauze and Band-Aid    Post-procedure details: Patient was observed during the  procedure. Post-procedure instructions were reviewed.  Patient left the clinic in stable condition.    Clinical History: Narrative & Impression CLINICAL DATA:  Low back and LEFT leg pain.  Recent fall.   EXAM: MRI LUMBAR SPINE WITHOUT CONTRAST   TECHNIQUE: Multiplanar, multisequence MR imaging of the lumbar spine was performed. No intravenous contrast was administered.   COMPARISON:  08/29/2016.   FINDINGS: Segmentation:  Standard.   Alignment:  Anatomic.   Vertebrae:  No worrisome osseous lesion.   Conus medullaris and cauda equina: Conus extends to the L1 level. Conus and cauda equina appear normal.   Paraspinal and other soft tissues: Unremarkable.   Disc levels:   L1-L2:  Slight disc narrowing and desiccation.  No impingement.   L2-L3:  Disc space narrowing.  Annular bulge.  No impingement.   L3-L4: Disc desiccation. Subligamentous protrusion. Facet arthropathy. No impingement.   L4-L5: Disc desiccation. Central and rightward protrusion. Foraminal protrusion, also to the RIGHT. Facet arthropathy. RIGHT L5 and RIGHT L4 neural impingement are likely.   L5-S1: Unremarkable disc space. Facet arthropathy. No impingement.   Compared with priors, the findings at L4-5 have progressed. The findings at L3-4 are improved.   IMPRESSION: Central and rightward protrusion at L4-5. RIGHT L4 and RIGHT L5 neural impingement likely.   Regression of previously noted extrusion L3-L4 on the RIGHT.   A dominant LEFT-sided compressive lesion is not established. Minor disc and facet pathology elsewhere, not clearly compressive.     Electronically Signed   By: Elsie Stain M.D.   On: 08/20/2018 11:29     Objective:  VS:  HT:    WT:   BMI:     BP:(!) 174/97  HR:79bpm  TEMP: ( )  RESP:  Physical Exam Vitals and nursing note reviewed.  Constitutional:      General: She is not in acute distress.    Appearance: Normal appearance. She is not ill-appearing.  HENT:      Head: Normocephalic and atraumatic.     Right Ear: External ear normal.     Left Ear: External ear normal.  Eyes:     Extraocular Movements: Extraocular movements intact.  Cardiovascular:     Rate and Rhythm: Normal rate.     Pulses: Normal pulses.  Pulmonary:     Effort: Pulmonary effort is normal. No respiratory distress.  Abdominal:     General: There is no distension.     Palpations: Abdomen is soft.  Musculoskeletal:        General: Tenderness present.     Cervical back: Neck supple.     Right lower leg: No edema.     Left lower leg: No edema.     Comments: Patient has good distal strength with no pain over the greater trochanters.  No clonus or focal weakness.  Skin:    Findings: No erythema, lesion or rash.  Neurological:     General: No focal deficit present.     Mental Status: She is alert and oriented to person, place, and time.     Sensory: No sensory deficit.     Motor: No weakness or abnormal muscle tone.     Coordination: Coordination normal.  Psychiatric:  Mood and Affect: Mood normal.        Behavior: Behavior normal.      Imaging: No results found.

## 2022-08-14 NOTE — Procedures (Signed)
Lumbosacral Transforaminal Epidural Steroid Injection - Sub-Pedicular Approach with Fluoroscopic Guidance  Patient: Courtney PIGGOTT      Date of Birth: 1961/02/11 MRN: [id number] PCP: Erskine Emery, NP      Visit Date: 08/06/2022   Universal Protocol:    Date/Time: 08/06/2022  Consent Given By: the patient  Position: PRONE  Additional Comments: Vital signs were monitored before and after the procedure. Patient was prepped and draped in the usual sterile fashion. The correct patient, procedure, and site was verified.   Injection Procedure Details:   Procedure diagnoses: Lumbar radiculopathy [M54.16]    Meds Administered:  Meds ordered this encounter  Medications   methylPREDNISolone acetate (DEPO-MEDROL) injection 40 mg    Laterality: Right  Location/Site: L5  Needle:5.0 in., 22 ga.  Short bevel or Quincke spinal needle  Needle Placement: Transforaminal  Findings:    -Comments: Excellent flow of contrast along the nerve, nerve root and into the epidural space.  Procedure Details: After squaring off the end-plates to get a true AP view, the C-arm was positioned so that an oblique view of the foramen as noted above was visualized. The target area is just inferior to the "nose of the scotty dog" or sub pedicular. The soft tissues overlying this structure were infiltrated with 2-3 ml. of 1% Lidocaine without Epinephrine.  The spinal needle was inserted toward the target using a "trajectory" view along the fluoroscope beam.  Under AP and lateral visualization, the needle was advanced so it did not puncture dura and was located close the 6 O'Clock position of the pedical in AP tracterory. Biplanar projections were used to confirm position. Aspiration was confirmed to be negative for CSF and/or blood. A 1-2 ml. volume of Isovue-250 was injected and flow of contrast was noted at each level. Radiographs were obtained for documentation purposes.   After attaining the desired flow  of contrast documented above, a 0.5 to 1.0 ml test dose of 0.25% Marcaine was injected into each respective transforaminal space.  The patient was observed for 90 seconds post injection.  After no sensory deficits were reported, and normal lower extremity motor function was noted,   the above injectate was administered so that equal amounts of the injectate were placed at each foramen (level) into the transforaminal epidural space.   Additional Comments:  The patient tolerated the procedure well Dressing: 2 x 2 sterile gauze and Band-Aid    Post-procedure details: Patient was observed during the procedure. Post-procedure instructions were reviewed.  Patient left the clinic in stable condition.

## 2022-08-17 ENCOUNTER — Other Ambulatory Visit (HOSPITAL_COMMUNITY): Payer: Self-pay

## 2022-08-21 ENCOUNTER — Other Ambulatory Visit (HOSPITAL_COMMUNITY): Payer: Self-pay

## 2022-08-21 NOTE — Progress Notes (Signed)
Office Visit Note  Patient: Courtney Kramer             Date of Birth: June 10, 1961           MRN: [id number]             PCP: Erskine Emery, NP Referring: Erskine Emery, NP Visit Date: 09/03/2022 Occupation: @GUAROCC @  Subjective:  Pain in both knees  History of Present Illness: Courtney Kramer is a 61 y.o. female with history of seropositive rheumatoid arthritis, osteoarthritis, and DDD. She is currently on Enbrel 50 mg sq injections once weekly and Plaquenil 200 mg 1 tablet by mouth twice daily.  She is tolerating both medications without any side effects and has not missed any doses recently.  She continues to find Enbrel to be ineffective at managing her symptoms.  She continues to have frequent flares involving both knee joints.  She had a right knee joint aspiration and cortisone injection performed on 06/28/2022 which has alleviated her discomfort.  She is currently having increased pain and inflammation in the left knee.  Her symptoms are exacerbated by colder weather temperatures.  She has tried life wave patches on her knees as well as has tried taking tramadol for pain relief.  She is willing to discuss other treatment options today.     Activities of Daily Living:  Patient reports morning stiffness for 1 hour.   Patient Denies nocturnal pain.  Difficulty dressing/grooming: Denies Difficulty climbing stairs: Reports Difficulty getting out of chair: Denies Difficulty using hands for taps, buttons, cutlery, and/or writing: Denies  Review of Systems  Constitutional:  Positive for fatigue.  HENT:  Positive for mouth dryness. Negative for mouth sores.   Eyes:  Negative for dryness.  Respiratory:  Negative for shortness of breath.   Cardiovascular:  Negative for chest pain and palpitations.  Gastrointestinal:  Negative for blood in stool, constipation and diarrhea.  Endocrine: Negative for increased urination.  Genitourinary:  Negative for involuntary urination.   Musculoskeletal:  Positive for joint pain, gait problem, joint pain, joint swelling, myalgias, morning stiffness, muscle tenderness and myalgias. Negative for muscle weakness.  Skin:  Positive for rash. Negative for color change, hair loss and sensitivity to sunlight.  Allergic/Immunologic: Negative for susceptible to infections.  Neurological:  Negative for dizziness and headaches.  Hematological:  Negative for swollen glands.  Psychiatric/Behavioral:  Negative for depressed mood and sleep disturbance. The patient is nervous/anxious.     PMFS History:  Patient Active Problem List   Diagnosis Date Noted   Rheumatoid arthritis involving multiple sites with positive rheumatoid factor (HCC) 04/21/2020   High risk medication use 04/21/2020   Primary osteoarthritis of both hands 04/21/2020   Primary osteoarthritis of both knees 04/21/2020   Primary osteoarthritis of both feet 04/21/2020   DDD (degenerative disc disease), lumbar 04/21/2020   Fracture, Col[place], right, closed 04/09/2018   Closed fracture of right distal radius and ulna, initial encounter 03/27/2018   Lumbar radiculopathy 09/19/2016   RENAL CALCULUS 05/04/2008   FLANK PAIN, RIGHT 05/04/2008    Past Medical History:  Diagnosis Date   Anxiety    Chronic back pain    Chronic back pain    GERD (gastroesophageal reflux disease)    History of cardiac murmur    07-26-2020  per pt happen after last pregnancy, work-up done was ok, and was told few years pcp no longer hears it   History of kidney stones    Kidney stones  OA (osteoarthritis)    hands , knees, feet   RA (rheumatoid arthritis) (HCC)    rheumotology--- Dr Kathie Rhodes. Deveshwar--- multiple sites seropositive/ positive rheumotoid factor,  taking plaquinel   Right ureteral calculus    Wears glasses     Family History  Problem Relation Age of Onset   Fibromyalgia Mother    Heart disease Mother    Macular degeneration Mother    Rheum arthritis Father    Rheum arthritis  Sister    Cancer Brother    Breast cancer Neg Hx    Past Surgical History:  Procedure Laterality Date   COLONOSCOPY  2012   CYSTO/  RIGHT URETERAL STENT PLACEMENT  05-10-2008   CYSTOSCOPY W/ URETERAL STENT PLACEMENT Right 06/09/2014   Procedure: CYSTOSCOPY WITH RETROGRADE PYELOGRAM/URETERAL STENT PLACEMENT;  Surgeon: Valetta Fuller, MD;  Location: [facility];  Service: Urology;  Laterality: Right;   CYSTOSCOPY W/ URETERAL STENT PLACEMENT Left 02/17/2020   Procedure: CYSTOSCOPY WITH RETROGRADE PYELOGRAM/URETERAL STENT PLACEMENT;  Surgeon: Sebastian Ache, MD;  Location: [facility];  Service: Urology;  Laterality: Left;  45 MINS   CYSTOSCOPY/URETEROSCOPY/HOLMIUM LASER/STENT PLACEMENT Left 02/22/2020   Procedure: CYSTOSCOPY/LEFT URETEROSCOPY/HOLMIUM LASER/STENT EXCHANGE;  Surgeon: [name], MD;  Location: [facility];  Service: Urology;  Laterality: Left;   CYSTOSCOPY/URETEROSCOPY/HOLMIUM LASER/STENT PLACEMENT Right 07/28/2020   Procedure: CYSTOSCOPY RIGHT URETEROSCOPY RIGHT RETROGRADE PYELOGRAM HOLMIUM LASER/STENT PLACEMENT;  Surgeon: [name], MD;  Location: [facility];  Service: Urology;  Laterality: Right;   EXTRACORPOREAL SHOCK WAVE LITHOTRIPSY Right 05-10-2008   KNEE ARTHROSCOPY Left 2009   OPEN REDUCTION INTERNAL FIXATION (ORIF) DISTAL RADIAL FRACTURE Right 03/27/2018   Procedure: OPEN REDUCTION INTERNAL FIXATION (ORIF) RIGHT DISTAL RADIUS FRACTURE;  Surgeon: Tarry Kos, MD;  Location: [facility];  Service: Orthopedics;  Laterality: Right;   WISDOM TOOTH EXTRACTION  AGE 52   Social History   Social History Narrative   Not on file    There is no immunization history on file for this patient.   Objective: Vital Signs: BP (!) 166/88 (BP Location: Left Arm, Patient Position: Sitting, Cuff Size: Normal)   Pulse 82   Resp 15   Ht  (1.676 m)   Wt 194 lb 6.4 oz (88.2 kg)   BMI 31.38 kg/m    Physical Exam Vitals and nursing note  reviewed.  Constitutional:      Appearance: She is well-developed.  HENT:     Head: Normocephalic and atraumatic.  Eyes:     Conjunctiva/sclera: Conjunctivae normal.  Cardiovascular:     Rate and Rhythm: Normal rate and regular rhythm.     Heart sounds: Normal heart sounds.  Pulmonary:     Effort: Pulmonary effort is normal.     Breath sounds: Normal breath sounds.  Abdominal:     General: Bowel sounds are normal.     Palpations: Abdomen is soft.  Musculoskeletal:     Cervical back: Normal range of motion.  Skin:    General: Skin is warm and dry.     Capillary Refill: Capillary refill takes less than 2 seconds.  Neurological:     Mental Status: She is alert and oriented to person, place, and time.  Psychiatric:        Behavior: Behavior normal.      Musculoskeletal Exam: C-spine, thoracic spine, and lumbar spine good ROM.  No midline spinal tenderness.  No SI joint tenderness.  Shoulder joints, elbow joints, wrist joints, MCPs, PIPs, and DIPs good  ROM with no synovitis.  Complete fist formation bilaterally.  Thickening and tenderness over bilateral 3rd PIP joints.  Hip joints have good ROM with no groin pain.  Painful ROM of the left knee with warmth but no effusion.  Right knee has good ROM with no warmth or effusion.  Ankle joints have good ROM with no tenderness or joint swelling.    CDAI Exam: CDAI Score: 5.2  Patient Global: 7 mm; Provider Global: 5 mm Swollen: 0 ; Tender: 4  Joint Exam 09/03/2022      Right  Left  PIP 3   Tender   Tender  Knee   Tender   Tender     Investigation: No additional findings.  Imaging: XR C-ARM NO REPORT  Result Date: 08/06/2022 Please see Notes tab for imaging impression.  Epidural Steroid injection  Result Date: 08/06/2022 Tyrell Antonio, MD     08/14/2022  6:27 PM Lumbosacral Transforaminal Epidural Steroid Injection - Sub-Pedicular Approach with Fluoroscopic Guidance Patient: Courtney Kramer     Date of Birth: June 09, 1961  MRN: [id number] PCP: Erskine Emery, NP     Visit Date: 08/06/2022  Universal Protocol:   Date/Time: 08/06/2022 Consent Given By: the patient Position: PRONE Additional Comments: Vital signs were monitored before and after the procedure. Patient was prepped and draped in the usual sterile fashion. The correct patient, procedure, and site was verified. Injection Procedure Details: Procedure diagnoses: Lumbar radiculopathy [M54.16]  Meds Administered: Meds ordered this encounter Medications  methylPREDNISolone acetate (DEPO-MEDROL) injection 40 mg Laterality: Right Location/Site: L5 Needle:5.0 in., 22 ga.  Short bevel or Quincke spinal needle Needle Placement: Transforaminal Findings:   -Comments: Excellent flow of contrast along the nerve, nerve root and into the epidural space. Procedure Details: After squaring off the end-plates to get a true AP view, the C-arm was positioned so that an oblique view of the foramen as noted above was visualized. The target area is just inferior to the "nose of the scotty dog" or sub pedicular. The soft tissues overlying this structure were infiltrated with 2-3 ml. of 1% Lidocaine without Epinephrine. The spinal needle was inserted toward the target using a "trajectory" view along the fluoroscope beam.  Under AP and lateral visualization, the needle was advanced so it did not puncture dura and was located close the 6 O'Clock position of the pedical in AP tracterory. Biplanar projections were used to confirm position. Aspiration was confirmed to be negative for CSF and/or blood. A 1-2 ml. volume of Isovue-250 was injected and flow of contrast was noted at each level. Radiographs were obtained for documentation purposes. After attaining the desired flow of contrast documented above, a 0.5 to 1.0 ml test dose of 0.25% Marcaine was injected into each respective transforaminal space.  The patient was observed for 90 seconds post injection.  After no sensory deficits were reported, and  normal lower extremity motor function was noted,   the above injectate was administered so that equal amounts of the injectate were placed at each foramen (level) into the transforaminal epidural space. Additional Comments: The patient tolerated the procedure well Dressing: 2 x 2 sterile gauze and Band-Aid  Post-procedure details: Patient was observed during the procedure. Post-procedure instructions were reviewed. Patient left the clinic in stable condition.    Recent Labs: Lab Results  Component Value Date   WBC 5.8 05/10/2022   HGB 15.0 05/10/2022   PLT 315 05/10/2022   NA 141 05/10/2022   K 4.0 05/10/2022   CL 106 05/10/2022  CO2 25 05/10/2022   GLUCOSE 83 05/10/2022   BUN 16 05/10/2022   CREATININE 0.88 05/10/2022   BILITOT 0.6 05/10/2022   AST 27 05/10/2022   ALT 22 05/10/2022   PROT 6.5 05/10/2022   CALCIUM 9.4 05/10/2022   GFRAA 87 03/15/2021   QFTBGOLDPLUS NEGATIVE 03/14/2022    Speciality Comments: PLQ eye exam: 07/12/2022 WNL. [facility] f/u 1 year. Arava- no reponse dcd after 4 months Enbrel 04/02/22 Patient does not want to take methotrexate as she believes it caused toxicity to her father.  Procedures:  No procedures performed Allergies: Sulfa antibiotics   Assessment / Plan:     Visit Diagnoses: Rheumatoid arthritis involving multiple sites with positive rheumatoid factor [facility]): Patient presents today with ongoing pain in both knee joints.  At times she continues to have difficulty ambulating due to the severity of pain and inflammation.  On examination today she has painful range of motion of both knee joints with warmth in the left knee.  She had a right knee joint aspiration performed on 06/28/22 which was consistent with an inflammatory process.  She remains on Enbrel 50 mg subcutaneous injections once weekly and Plaquenil 200 mg 1 tablet by mouth twice daily.  She has not noticed any clinical improvement since initiating Enbrel in July 2023.  She has been  tolerating both medications without any side effects but continues to have frequent and severe flares.  Different treatment options were discussed today in detail.  She is hesitant to add on methotrexate.  Indications, contraindications, potential side effects of Humira were discussed.  All questions were addressed and consent was obtained.  Once Humira has been approved she will return to the office for administration of the first injection.  She will remain on Plaquenil as combination therapy.  She will follow-up in the office in 6 to 8 weeks to assess her response.  Counseled patient that Humira is a TNF blocking agent.  Counseled patient on purpose, proper use, and adverse effects of Humira.  Reviewed the most common adverse effects including infections, headache, and injection site reactions. Discussed that there is the possibility of an increased risk of malignancy including non-melanoma skin cancer but it is not well understood if this increased risk is due to the medication or the disease state.  Advised patient to get yearly dermatology exams due to risk of skin cancer. Counseled patient that Humira should be held prior to scheduled surgery.  Counseled patient to avoid live vaccines while on Humira.  Recommend annual influenza, PCV 15 or PCV20 or Pneumovax 23, and Shingrix as indicated.  Reviewed the importance of regular labs while on Humira therapy.  Will monitor CBC and CMP 1 month after starting and then every 3 months routinely thereafter. Will monitor TB gold annually. Standing orders placed.    Provided patient with medication education material and answered all questions.  Patient consented to Humira.  Will upload consent into the media tab.  Reviewed storage instructions of Humira.  Advised initial injection must be administered in office.  Patient verbalized understanding.   Dose will be for rheumatoid arthritis Humira 40 mg every 14 days.  Prescription pending lab results and/or insurance  approval.  High risk medication use - Applying for Humira 40 mg sq injections every 14 days.  She will remain on plaquenil as prescribed.  CBC and CMP updated on 05/10/22. Orders for CBC and CMP released today. Her next lab work will be due in March and every 3 months.  PLQ eye exam: 07/12/2022 WNL. [facility] f/u 1 year.  Patient declined use of Methotrexate.  Inadequate response to Arava and Enbrel.  TB gold negative 03/14/22.   Discussed the importance of holding Enbrel if she develops signs or symptoms of an infection and to resume once the infection has completely cleared.  - Plan: CBC with Differential/Platelet, COMPLETE METABOLIC PANEL WITH GFR  Primary osteoarthritis of both hands: She has PIP and DIP thickening consistent with osteoarthritis of both hands.  Tenderness over bilateral third PIP joints.  Discussed the importance of joint protection and muscle strengthening.  Pseudogout - Synovial fluid aspiration from 06/28/2022:WBC count 1009. The synovial fluid also showed calcium pyrophosphate crystals.  She remains on Celebrex 200 mg 1 capsule by mouth daily for pain relief.  Effusion, right knee - Aspiration and injection on 06/28/2022.  Improved.  She remains on Celebrex 200 mg 1 capsule daily for pain relief.  Primary osteoarthritis of both knees: She continues to have persistent discomfort in both knee joints.  Her right knee joint was aspirated and injected with cortisone on 06/28/2022 which has provided temporary relief.  She presents today with increased discomfort in the left knee joint.  Her pain is currently a 7 out of 10.  She has been taking Celebrex 200 mg 1 capsule daily for pain relief.  She has not noticed any improvement in her symptoms since initiating Enbrel.  At her last office visit on 06/28/2022 there was discussion of switching from Enbrel to Humira if she continues to have recurrent flares.  She is open to making the medication change at this time.  Will apply for  Humira through her insurance. Opted out of performing a left knee joint cortisone injection today due to elevated blood pressure.  If her blood pressure is better controlled in the future she can return for a left knee joint cortisone injection.  Primary osteoarthritis of both feet: She is not experiencing any increased discomfort in her feet at this time.  Good range of motion of both ankle joints with no tenderness or synovitis.  DDD (degenerative disc disease), lumbar: No midline spinal tenderness.   Other medical conditions are listed as follows:   Other fatigue: Stable.   Closed Colles' fracture of right radius with routine healing, subsequent encounter  RENAL CALCULUS  Essential hypertension: BP was 166/88 today in the office.  Opted out of performing a left knee joint cortisone injection today due to elevated BP.  Patient was advised to monitor blood pressure closely.   Family history of rheumatoid arthritis - father and sister.  Orders: Orders Placed This Encounter  Procedures   CBC with Differential/Platelet   COMPLETE METABOLIC PANEL WITH GFR   No orders of the defined types were placed in this encounter.    Follow-Up Instructions: Return in about 8 weeks (around 10/29/2022) for Rheumatoid arthritis, Osteoarthritis, DDD.   Gearldine Bienenstock, PA-C  Note - This record has been created using Dragon software.  Chart creation errors have been sought, but may not always  have been located. Such creation errors do not reflect on  the standard of medical care.

## 2022-09-03 ENCOUNTER — Telehealth: Payer: Self-pay | Admitting: Pharmacist

## 2022-09-03 ENCOUNTER — Ambulatory Visit: Payer: Commercial Managed Care - HMO | Attending: Physician Assistant | Admitting: Physician Assistant

## 2022-09-03 ENCOUNTER — Encounter: Payer: Self-pay | Admitting: Physician Assistant

## 2022-09-03 VITALS — BP 166/88 | HR 82 | Resp 15 | Ht 66.0 in | Wt 194.4 lb

## 2022-09-03 DIAGNOSIS — M1120 Other chondrocalcinosis, unspecified site: Secondary | ICD-10-CM | POA: Diagnosis not present

## 2022-09-03 DIAGNOSIS — Z79899 Other long term (current) drug therapy: Secondary | ICD-10-CM

## 2022-09-03 DIAGNOSIS — M19042 Primary osteoarthritis, left hand: Secondary | ICD-10-CM

## 2022-09-03 DIAGNOSIS — M19071 Primary osteoarthritis, right ankle and foot: Secondary | ICD-10-CM

## 2022-09-03 DIAGNOSIS — M25461 Effusion, right knee: Secondary | ICD-10-CM

## 2022-09-03 DIAGNOSIS — M19041 Primary osteoarthritis, right hand: Secondary | ICD-10-CM | POA: Diagnosis not present

## 2022-09-03 DIAGNOSIS — M0579 Rheumatoid arthritis with rheumatoid factor of multiple sites without organ or systems involvement: Secondary | ICD-10-CM | POA: Diagnosis not present

## 2022-09-03 DIAGNOSIS — I10 Essential (primary) hypertension: Secondary | ICD-10-CM

## 2022-09-03 DIAGNOSIS — M170 Bilateral primary osteoarthritis of knee: Secondary | ICD-10-CM

## 2022-09-03 DIAGNOSIS — S52531D Colles' fracture of right radius, subsequent encounter for closed fracture with routine healing: Secondary | ICD-10-CM

## 2022-09-03 DIAGNOSIS — Z8261 Family history of arthritis: Secondary | ICD-10-CM

## 2022-09-03 DIAGNOSIS — R5383 Other fatigue: Secondary | ICD-10-CM

## 2022-09-03 DIAGNOSIS — M5136 Other intervertebral disc degeneration, lumbar region: Secondary | ICD-10-CM

## 2022-09-03 DIAGNOSIS — N200 Calculus of kidney: Secondary | ICD-10-CM

## 2022-09-03 DIAGNOSIS — M19072 Primary osteoarthritis, left ankle and foot: Secondary | ICD-10-CM

## 2022-09-03 NOTE — Telephone Encounter (Addendum)
Please start HUMIRA BIV. Pending OV note to be signed from 09/03/22  Dose: 40mg  SQ every 14 days  Dx: RA  Previously tried therapies: Enbrel - inadequate response Leflunomide - no response Hydroxychloroquine - current  Patient would like to start  after finished Enbrel supply she has at home (x 3 doses)  , PharmD, MPH, BCPS, CPP Clinical Pharmacist (Rheumatology and Pulmonology)

## 2022-09-03 NOTE — Progress Notes (Signed)
CBC WNL

## 2022-09-03 NOTE — Progress Notes (Signed)
Pharmacy Note Subjective: Patient presents today to [facility] for follow up office visit. Patient seen by the pharmacist for counseling on Humira for rheumatoid arthritis.  Prior therapy includes: Enbrel (minimal clinical response, was started on 04/02/2022). Leflunomide discontinued after 4 months due to lack of response.  Diagnosis of heart failure: No  Objective:  CBC    Component Value Date/Time   WBC 5.8 05/10/2022 1000   RBC 4.86 05/10/2022 1000   HGB 15.0 05/10/2022 1000   HCT 43.8 05/10/2022 1000   PLT 315 05/10/2022 1000   MCV 90.1 05/10/2022 1000   MCH 30.9 05/10/2022 1000   MCHC 34.2 05/10/2022 1000   RDW 12.1 05/10/2022 1000   LYMPHSABS 2,018 05/10/2022 1000   EOSABS 128 05/10/2022 1000   BASOSABS 70 05/10/2022 1000     CMP     Component Value Date/Time   NA 141 05/10/2022 1000   K 4.0 05/10/2022 1000   CL 106 05/10/2022 1000   CO2 25 05/10/2022 1000   GLUCOSE 83 05/10/2022 1000   BUN 16 05/10/2022 1000   CREATININE 0.88 05/10/2022 1000   CALCIUM 9.4 05/10/2022 1000   PROT 6.5 05/10/2022 1000   AST 27 05/10/2022 1000   ALT 22 05/10/2022 1000   BILITOT 0.6 05/10/2022 1000   GFRNONAA 75 03/15/2021 1028   GFRAA 87 03/15/2021 1028      Baseline Immunosuppressant Therapy Labs TB GOLD    Latest Ref Rng & Units 03/14/2022   10:42 AM  Quantiferon TB Gold  Quantiferon TB Gold Plus NEGATIVE NEGATIVE    Hepatitis Panel    Latest Ref Rng & Units 07/22/2019    8:58 AM  Hepatitis  Hep B Surface Ag NON-REACTI NON-REACTIVE   Hep B IgM NON-REACTI NON-REACTIVE   Hep C Ab NON-REACTI NON-REACTIVE    HIV Lab Results  Component Value Date   HIV NON-REACTIVE 07/22/2019   Immunoglobulins    Latest Ref Rng & Units 07/22/2019    8:58 AM  Immunoglobulin Electrophoresis  IgA  47 - 310 mg/dL 633   IgG 354 - 5,625 mg/dL 638   IgM 50 - 937 mg/dL 342    SPEP    Latest Ref Rng & Units 05/10/2022   10:00 AM  Serum Protein Electrophoresis  Total  Protein 6.1 - 8.1 g/dL 6.5    A7GO Lab Results  Component Value Date   G6PDH 13.0 07/22/2019   TPMT No results found for: "TPMT"   Chest x-ray: 03/27/2022 - No active cardiopulmonary disease  Assessment/Plan:  Counseled patient that Humira is a TNF blocking agent.  Counseled patient on purpose, proper use, and adverse effects of Humira.  Reviewed the most common adverse effects including infections, headache, and injection site reactions. Discussed that there is the possibility of an increased risk of malignancy including non-melanoma skin cancer but it is not well understood if this increased risk is due to the medication or the disease state.  Advised patient to get yearly dermatology exams due to risk of skin cancer. Counseled patient that Humira should be held prior to scheduled surgery.  Counseled patient to avoid live vaccines while on Humira.  Recommend annual influenza, PCV 15 or PCV20 or Pneumovax 23, and Shingrix as indicated.  Reviewed the importance of regular labs while on Humira therapy. Will monitor CBC and CMP 1 month after starting and then every 3 months routinely thereafter. Will monitor TB gold annually. Standing orders placed. Provided patient with medication education material and answered all questions.  Patient consented to Humira.  Will upload consent into the media tab.  Reviewed storage instructions of Humira.  Advised initial injection must be administered in office.  Patient verbalized understanding.   Patient had skin exam with her dermatologist on 04/26/22.  Dose will be for rheumatoid arthritis Humira 40 mg every 14 days.  Prescription pending lab results and/or insurance approval.

## 2022-09-03 NOTE — Patient Instructions (Signed)
Standing Labs We placed an order today for your standing lab work.   Please have your standing labs drawn in 1 month and then every 3 months  Please have your labs drawn 2 weeks prior to your appointment so that the provider can discuss your lab results at your appointment.  Please note that you may see your imaging and lab results in MyChart before we have reviewed them. We will contact you once all results are reviewed. Please allow our office up to 72 hours to thoroughly review all of the results before contacting the office for clarification of your results.  Lab hours are:   Monday through Thursday from 8:00 am -12:30 pm and 1:00 pm-5:00 pm and Friday from 8:00 am-12:00 pm.  Please be advised, all patients with office appointments requiring lab work will take precedent over walk-in lab work.   Labs are drawn by [facility]. Please bring your co-pay at the time of your lab draw.  You may receive a bill from [facility] for your lab work.  Please note if you are on Hydroxychloroquine and and an order has been placed for a Hydroxychloroquine level, you will need to have it drawn 4 hours or more after your last dose.  If you wish to have your labs drawn at another location, please call the office 24 hours in advance so we can fax the orders.  The office is located at [address], [place], [place] [address] No appointment is necessary.    If you have any questions regarding directions or hours of operation,  please call [phone].   As a reminder, please drink plenty of water prior to coming for your lab work. Thanks!   Adalimumab Injection What is this medication? ADALIMUMAB (ay da LIM yoo mab) treats autoimmune conditions, such as psoriasis, arthritis, Crohn's disease, and ulcerative colitis. It works by slowing down an overactive immune system. It belongs to a group of medications called TNF inhibitors. It is a monoclonal antibody. This medicine may be used for other  purposes; ask your health care provider or pharmacist if you have questions. COMMON BRAND NAME(S): AMJEVITA, CYLTEZO, HADLIMA, Hulio, Hulio PEN, Humira, Hyrimoz, Idacio, YUFLYMA, YUSIMRY What should I tell my care team before I take this medication? They need to know if you have any of these conditions: Cancer Diabetes (high blood sugar) Having surgery Heart disease Hepatitis B Immune system problems Infections, such as tuberculosis (TB) or other bacterial, fungal, or viral infections Multiple sclerosis Recent or upcoming vaccine An unusual or allergic reaction to adalimumab, mannitol, latex, rubber, other medications, foods, dyes, or preservatives Pregnant or trying to get pregnant Breast-feeding How should I use this medication? This medication is injected under the skin. It may be given by your care team in a hospital or clinic setting. It may also be given at home. If you get this medication at home, you will be taught how to prepare and give it. Use exactly as directed. Take it as directed on the prescription label. Keep taking it unless your care team tells you to stop. This medication comes with INSTRUCTIONS FOR USE. Ask your pharmacist for directions on how to use this medication. Read the information carefully. Talk to your pharmacist or care team if you have questions. It is important that you put your used needles and syringes in a special sharps container. Do not put them in a trash can. If you do not have a sharps container, call your pharmacist or care team to get  one. A special MedGuide will be given to you by the pharmacist with each prescription and refill. If you are getting this medication in a hospital or clinic, a special MedGuide will be given to you before each treatment. Be sure to read this information carefully each time. Talk to your care team about the use of this medication in children. While it be prescribed for children as young as 2 years for selected conditions,  precautions do apply. Overdosage: If you think you have taken too much of this medicine contact a poison control center or emergency room at once. NOTE: This medicine is only for you. Do not share this medicine with others. What if I miss a dose? If you get this medication at the hospital or clinic: it is important not to miss your dose. Call your care team if you are unable to keep an appointment. If you give yourself this medication at home: If you miss a dose, take it as soon as you can. If it is almost time for your next dose, take only that dose. Do not take double or extra doses. Call your care team with questions. What may interact with this medication? Do not take this medication with any of the following: Abatacept Anakinra Biologic medications, such as certolizumab, etanercept, golimumab, infliximab Live virus vaccines This medication may also interact with the following: Cyclosporine Theophylline Vaccines Warfarin This list may not describe all possible interactions. Give your health care provider a list of all the medicines, herbs, non-prescription drugs, or dietary supplements you use. Also tell them if you smoke, drink alcohol, or use illegal drugs. Some items may interact with your medicine. What should I watch for while using this medication? Visit your care team for regular checks on your progress. Tell your care team if your symptoms do not start to get better or if they get worse. You will be tested for tuberculosis (TB) before you start this medication. If your care team prescribes any medication for TB, you should start taking the TB medication before starting this medication. Make sure to finish the full course of TB medication. This medication may increase your risk of getting an infection. Call your care team for advice if you get a fever, chills, sore throat, or other symptoms of a cold or flu. Do not treat yourself. Try to avoid being around people who are sick. Talk  to your care team about your risk of cancer. You may be more at risk for certain types of cancer if you take this medication. What side effects may I notice from receiving this medication? Side effects that you should report to your care team as soon as possible: Allergic reactions--skin rash, itching, hives, swelling of the face, lips, tongue, or throat Aplastic anemia--unusual weakness or fatigue, dizziness, headache, trouble breathing, increased bleeding or bruising Body pain, tingling, or numbness Heart failure--shortness of breath, swelling of the ankles, feet, or hands, sudden weight gain, unusual weakness or fatigue Infection--fever, chills, cough, sore throat, wounds that don't heal, pain or trouble when passing urine, general feeling of discomfort or being unwell Lupus-like syndrome--joint pain, swelling, or stiffness, butterfly-shaped rash on the face, rashes that get worse in the sun, fever, unusual weakness or fatigue Unusual bruising or bleeding Side effects that usually do not require medical attention (report to your care team if they continue or are bothersome): Headache Nausea Pain, redness, or irritation at injection site Runny or stuffy nose Sore throat Stomach pain This list may not describe all  possible side effects. Call your doctor for medical advice about side effects. You may report side effects to FDA at 1-800-FDA-1088. Where should I keep my medication? Keep out of the reach of children and pets. Store in the refrigerator. Do not freeze. Keep this medication in the original packaging until you are ready to take it. Protect from light. Get rid of any unused medication after the expiration date. This medication may be stored at room temperature for up to 14 days. Keep this medication in the original packaging. Protect from light. If it is stored at room temperature, get rid of any unused medication after 14 days or after it expires, whichever is first. To get rid of  medications that are no longer needed or have expired: Take the medication to a medication take-back program. Check with your pharmacy or law enforcement to find a location. If you cannot return the medication, ask your pharmacist or care team how to get rid of this medication safely. NOTE: This sheet is a summary. It may not cover all possible information. If you have questions about this medicine, talk to your doctor, pharmacist, or health care provider.  2023 Elsevier/Gold Standard (2021-11-16 00:00:00)

## 2022-09-04 LAB — CBC WITH DIFFERENTIAL/PLATELET
Absolute Monocytes: 539 cells/uL (ref 200–950)
Basophils Absolute: 81 cells/uL (ref 0–200)
Basophils Relative: 1.3 %
Eosinophils Absolute: 112 cells/uL (ref 15–500)
Eosinophils Relative: 1.8 %
HCT: 42.5 % (ref 35.0–45.0)
Hemoglobin: 14.7 g/dL (ref 11.7–15.5)
Lymphs Abs: 2195 cells/uL (ref 850–3900)
MCH: 31.1 pg (ref 27.0–33.0)
MCHC: 34.6 g/dL (ref 32.0–36.0)
MCV: 89.9 fL (ref 80.0–100.0)
MPV: 10 fL (ref 7.5–12.5)
Monocytes Relative: 8.7 %
Neutro Abs: 3274 cells/uL (ref 1500–7800)
Neutrophils Relative %: 52.8 %
Platelets: 340 10*3/uL (ref 140–400)
RBC: 4.73 10*6/uL (ref 3.80–5.10)
RDW: 11.7 % (ref 11.0–15.0)
Total Lymphocyte: 35.4 %
WBC: 6.2 10*3/uL (ref 3.8–10.8)

## 2022-09-04 LAB — COMPLETE METABOLIC PANEL WITH GFR
AG Ratio: 1.8 (calc) (ref 1.0–2.5)
ALT: 18 U/L (ref 6–29)
AST: 20 U/L (ref 10–35)
Albumin: 4.3 g/dL (ref 3.6–5.1)
Alkaline phosphatase (APISO): 93 U/L (ref 37–153)
BUN: 17 mg/dL (ref 7–25)
CO2: 29 mmol/L (ref 20–32)
Calcium: 9.4 mg/dL (ref 8.6–10.4)
Chloride: 107 mmol/L (ref 98–110)
Creat: 0.76 mg/dL (ref 0.50–1.05)
Globulin: 2.4 g/dL (calc) (ref 1.9–3.7)
Glucose, Bld: 65 mg/dL (ref 65–99)
Potassium: 3.9 mmol/L (ref 3.5–5.3)
Sodium: 143 mmol/L (ref 135–146)
Total Bilirubin: 0.4 mg/dL (ref 0.2–1.2)
Total Protein: 6.7 g/dL (ref 6.1–8.1)
eGFR: 89 mL/min/{1.73_m2} (ref >=60)

## 2022-09-04 NOTE — Telephone Encounter (Signed)
Submitted a Prior Authorization request to [employer] for HUMIRA via CoverMyMeds. Will update once we receive a response.  Key: [id number]  Chesley Mires, PharmD, MPH, BCPS, CPP Clinical Pharmacist (Rheumatology and Pulmonology)

## 2022-09-04 NOTE — Progress Notes (Signed)
CMP WNL

## 2022-09-05 ENCOUNTER — Other Ambulatory Visit (HOSPITAL_COMMUNITY): Payer: Self-pay

## 2022-09-05 NOTE — Telephone Encounter (Signed)
Received notification from [employer] regarding a prior authorization for HUMIRA. Authorization has been APPROVED from 09/04/22 to 09/04/23.  Per test claim, copay for 28 days supply is $0  Patient can fill through [facility]: [phone]   Authorization # [id number]  Copay card information is pending [employer] Complete [employer] portal.  Patient scheduled for Humira new start on 10/04/2022. Will use sample  Chesley Mires, PharmD, MPH, BCPS, CPP Clinical Pharmacist (Rheumatology and Pulmonology)

## 2022-09-13 ENCOUNTER — Other Ambulatory Visit (HOSPITAL_COMMUNITY): Payer: Self-pay

## 2022-09-14 ENCOUNTER — Other Ambulatory Visit (HOSPITAL_COMMUNITY): Payer: Self-pay

## 2022-09-14 NOTE — Telephone Encounter (Signed)
Humira copay card information per [employer] Complete [employer] portal: Issued: 09/11/2022 ID: [id number] Rx GROUP: [id number] Rx BIN: [id number] Rx PCN: [id number]  Chesley Mires, PharmD, MPH, BCPS, CPP Clinical Pharmacist (Rheumatology and Pulmonology)

## 2022-09-18 ENCOUNTER — Other Ambulatory Visit (HOSPITAL_COMMUNITY): Payer: Self-pay

## 2022-10-03 NOTE — Progress Notes (Deleted)
Pharmacy Note  Subjective:   Patient presents to clinic today to receive first dose of HUMIRA for rheumatoid arthritis. Patient currently takes ***.  Patient running a fever or have signs/symptoms of infection? {yes/no:20286}  Patient currently on antibiotics for the treatment of infection? {yes/no:20286}  Patient have any upcoming invasive procedures/surgeries? {yes/no:20286}  Objective: CMP     Component Value Date/Time   NA 143 09/03/2022 0848   K 3.9 09/03/2022 0848   CL 107 09/03/2022 0848   CO2 29 09/03/2022 0848   GLUCOSE 65 09/03/2022 0848   BUN 17 09/03/2022 0848   CREATININE 0.76 09/03/2022 0848   CALCIUM 9.4 09/03/2022 0848   PROT 6.7 09/03/2022 0848   AST 20 09/03/2022 0848   ALT 18 09/03/2022 0848   BILITOT 0.4 09/03/2022 0848   GFRNONAA 75 03/15/2021 1028   GFRAA 87 03/15/2021 1028    CBC    Component Value Date/Time   WBC 6.2 09/03/2022 0848   RBC 4.73 09/03/2022 0848   HGB 14.7 09/03/2022 0848   HCT 42.5 09/03/2022 0848   PLT 340 09/03/2022 0848   MCV 89.9 09/03/2022 0848   MCH 31.1 09/03/2022 0848   MCHC 34.6 09/03/2022 0848   RDW 11.7 09/03/2022 0848   LYMPHSABS 2,195 09/03/2022 0848   EOSABS 112 09/03/2022 0848   BASOSABS 81 09/03/2022 0848    Baseline Immunosuppressant Therapy Labs TB GOLD    Latest Ref Rng & Units 03/14/2022   10:42 AM  Quantiferon TB Gold  Quantiferon TB Gold Plus NEGATIVE NEGATIVE    Hepatitis Panel    Latest Ref Rng & Units 07/22/2019    8:58 AM  Hepatitis  Hep B Surface Ag NON-REACTI NON-REACTIVE   Hep B IgM NON-REACTI NON-REACTIVE   Hep C Ab NON-REACTI NON-REACTIVE    HIV Lab Results  Component Value Date   HIV NON-REACTIVE 07/22/2019   Immunoglobulins    Latest Ref Rng & Units 07/22/2019    8:58 AM  Immunoglobulin Electrophoresis  IgA  47 - 310 mg/dL 157   IgG 600 - 1,640 mg/dL 937   IgM 50 - 300 mg/dL 335    SPEP    Latest Ref Rng & Units 09/03/2022    8:48 AM  Serum Protein Electrophoresis   Total Protein 6.1 - 8.1 g/dL 6.7    G6PD Lab Results  Component Value Date   G6PDH 13.0 07/22/2019   Chest x-ray: ***  Assessment/Plan:  Reviewed importance of holding HUMIRA with signs/symptoms of an infections, if antibiotics are prescribed to treat an active infection, and with invasive procedures  Demonstrated proper injection technique with HUMIRA demo device  Patient able to demonstrate proper injection technique using the teach back method.  Patient self injected in the {injsitedsg:28167} with:  Sample Medication: HUMIRA 59m/0.4ml pen injector NDC: *** Lot: *** Expiration: ***  Patient tolerated well.  Observed for 30 mins in office for adverse reaction and ***.   Patient is to return in 1 month for labs and 6-8 weeks for follow-up appointment.  Standing orders placed.   HUMIRA approved through {specialtycoverage:25706} .   Rx sent to: {SpecialtyPharmacies:25705}.  Patient provided with pharmacy phone number and advised to call later this week to schedule shipment to home.  Patient will continue HUMIRA 485mSQ every 14 days in combination with ***.  All questions encouraged and answered.  Instructed patient to call with any further questions or concerns.  DeKnox SalivaPharmD, MPH, BCPS, CPP Clinical Pharmacist (Rheumatology and Pulmonology)  10/04/2022 8:50 AM

## 2022-10-04 ENCOUNTER — Ambulatory Visit: Payer: Commercial Managed Care - HMO | Admitting: Pharmacist

## 2022-10-08 ENCOUNTER — Ambulatory Visit: Payer: Commercial Managed Care - HMO | Attending: Rheumatology | Admitting: Pharmacist

## 2022-10-08 ENCOUNTER — Other Ambulatory Visit (HOSPITAL_COMMUNITY): Payer: Self-pay

## 2022-10-08 DIAGNOSIS — Z79899 Other long term (current) drug therapy: Secondary | ICD-10-CM

## 2022-10-08 DIAGNOSIS — M0579 Rheumatoid arthritis with rheumatoid factor of multiple sites without organ or systems involvement: Secondary | ICD-10-CM

## 2022-10-08 DIAGNOSIS — Z7189 Other specified counseling: Secondary | ICD-10-CM

## 2022-10-08 MED ORDER — HUMIRA (2 PEN) 40 MG/0.4ML ~~LOC~~ AJKT
40.0000 mg | AUTO-INJECTOR | SUBCUTANEOUS | 0 refills | Status: DC
  Filled 2022-10-08: qty 6, fill #0
  Filled 2022-10-30: qty 2, 28d supply, fill #0
  Filled 2022-11-13: qty 2, 28d supply, fill #1
  Filled 2022-12-12: qty 2, 28d supply, fill #2

## 2022-10-08 NOTE — Patient Instructions (Addendum)
Your next HUMIRA dose is due on 10/22/2022, 11/05/2022, and every 14 days thereafter  CONTINUE hydroxychloroquine 200mg  twice daily  HOLD HUMIRA if you have signs or symptoms of an infection. You can resume once you feel better or back to your baseline. HOLD HUMIRA if you start antibiotics to treat an infection. HOLD HUMIRA around the time of surgery/procedures. Your surgeon will be able to provide recommendations on when to hold BEFORE and when you are cleared to [place].  Pharmacy information: Your prescription will be shipped from [facility]. Their phone number is [phone] [facility] will call you to set up the first shipment For future refills, the pharmacy will call to schedule shipment and confirm address. They will mail your medication to your home.  Cost information: Your copay should be affordable. If you call the pharmacy and it is not affordable, please double-check that they are billing through your copay card as secondary coverage. That copay card information is: ID: [id number] Rx GROUP: [id number] Rx BIN: [id number] Rx PCN: [id number]  Labs are due in 1 month then every 3 months. Lab hours are from Monday to Thursday 8am-12:30pm and 1pm-5pm and Friday 8am-12pm. You do not need an appointment if you come for labs during these times.  How to manage an injection site reaction: Remember the 5 C's: COUNTER - leave on the counter at least 30 minutes but up to overnight to bring medication to room temperature. This may help prevent stinging COLD - place something cold (like an ice gel pack or cold water bottle) on the injection site just before cleansing with alcohol. This may help reduce pain CLARITIN - use Claritin (generic name is loratadine) for the first two weeks of treatment or the day of, the day before, and the day after injecting. This will help to minimize injection site reactions CORTISONE CREAM - apply if injection site is irritated and itching CALL ME - if  injection site reaction is bigger than the size of your fist, looks infected, blisters, or if you develop hives

## 2022-10-08 NOTE — Progress Notes (Signed)
Pharmacy Note  Subjective:   Patient presents to clinic today to receive first dose of HUMIRA. Patient is transitioning from Enbrel to which she's had a waning response. Her last dose of Enbrel was on 10/01/2022. She continues on hydroxychloroquine 200mg  twice daily  Patient running a fever or have signs/symptoms of infection? No  Patient currently on antibiotics for the treatment of infection? No  Patient have any upcoming invasive procedures/surgeries? No  Objective: CMP     Component Value Date/Time   NA 143 09/03/2022 0848   K 3.9 09/03/2022 0848   CL 107 09/03/2022 0848   CO2 29 09/03/2022 0848   GLUCOSE 65 09/03/2022 0848   BUN 17 09/03/2022 0848   CREATININE 0.76 09/03/2022 0848   CALCIUM 9.4 09/03/2022 0848   PROT 6.7 09/03/2022 0848   AST 20 09/03/2022 0848   ALT 18 09/03/2022 0848   BILITOT 0.4 09/03/2022 0848   GFRNONAA 75 03/15/2021 1028   GFRAA 87 03/15/2021 1028    CBC    Component Value Date/Time   WBC 6.2 09/03/2022 0848   RBC 4.73 09/03/2022 0848   HGB 14.7 09/03/2022 0848   HCT 42.5 09/03/2022 0848   PLT 340 09/03/2022 0848   MCV 89.9 09/03/2022 0848   MCH 31.1 09/03/2022 0848   MCHC 34.6 09/03/2022 0848   RDW 11.7 09/03/2022 0848   LYMPHSABS 2,195 09/03/2022 0848   EOSABS 112 09/03/2022 0848   BASOSABS 81 09/03/2022 0848    Baseline Immunosuppressant Therapy Labs TB GOLD    Latest Ref Rng & Units 03/14/2022   10:42 AM  Quantiferon TB Gold  Quantiferon TB Gold Plus NEGATIVE NEGATIVE    Hepatitis Panel    Latest Ref Rng & Units 07/22/2019    8:58 AM  Hepatitis  Hep B Surface Ag NON-REACTI NON-REACTIVE   Hep B IgM NON-REACTI NON-REACTIVE   Hep C Ab NON-REACTI NON-REACTIVE    HIV Lab Results  Component Value Date   HIV NON-REACTIVE 07/22/2019   Immunoglobulins    Latest Ref Rng & Units 07/22/2019    8:58 AM  Immunoglobulin Electrophoresis  IgA  47 - 310 mg/dL 157   IgG 600 - 1,640 mg/dL 937   IgM 50 - 300 mg/dL 335    SPEP     Latest Ref Rng & Units 09/03/2022    8:48 AM  Serum Protein Electrophoresis  Total Protein 6.1 - 8.1 g/dL 6.7    G6PD Lab Results  Component Value Date   G6PDH 13.0 07/22/2019   TPMT No results found for: "TPMT"   Chest x-ray: 03/27/2022 - No active cardiopulmonary disease.  Assessment/Plan:  Reviewed importance of holding HUMIRA with signs/symptoms of an infections, if antibiotics are prescribed to treat an active infection, and with invasive procedures  Demonstrated proper injection technique with HUMIRA PEN demo device  Patient able to demonstrate proper injection technique using the teach back method.  Patient self injected in the right upper thigh with:  Sample Medication: HUMIRA 40MG Maryann Alar PEN NDC: [address]-16 Lot: [id number] Expiration: 06/2023  Patient tolerated well.  Observed for 30 mins in office for adverse reaction. Some localized redness noted. Patient states the area is itchy. However, she confirms that this occurs with each Enbrel injection. She applies hydrocortisone cream that resolves this. She denies any symptoms of anaphylaxis - shortness of breath, difficulty breathing, swelling of mouth. Reviewed injection site reaction management including hydrocortisone cream and icing the area liberally  Patient is to return in 1 month for labs and  6-8 weeks for follow-up appointment.  Standing orders for CBC and CMP remain in place.   HUMIRA approved through insurance .   Rx sent to: [facility]: [phone] .  Patient provided with pharmacy phone number and advised to call later this week to schedule shipment to home.  Patient will continue HUMIRA 40mg  subcut every 14 days in combination with hydroxychloroquine 200mg  twice daily.  All questions encouraged and answered.  Instructed patient to call with any further questions or concerns.  Knox Saliva, PharmD, MPH, BCPS, CPP Clinical Pharmacist (Rheumatology and  Pulmonology)  10/08/2022 8:11 AM

## 2022-10-10 ENCOUNTER — Telehealth: Payer: Self-pay | Admitting: *Deleted

## 2022-10-10 NOTE — Telephone Encounter (Signed)
Patient had redness at injection site and had stated that this occurs with all of her Enbrel doses. She was not concerned because she also had stated she has itchiness and redness with her Enbrel doses. So if if this is the case, then the same itchiness may occur when she restarts Enbrel. We will have to run prior authorization for Enbrel again if she wants to go back  Knox Saliva, PharmD, MPH, BCPS, CPP Clinical Pharmacist (Rheumatology and Pulmonology)

## 2022-10-10 NOTE — Telephone Encounter (Signed)
Patient called the office and states she was in the office on 10/08/2022 and was started on Humira. Patient states she she had a reaction while in the office. Patient states later that day as well as yesterday she had moderate itching. Patient states she also had a severe headache. Patient states she tried some hydrocortisone cream. Patient advised she can try some Benadryl to help with the itching and patient states she does not want to do that. Patient states "I just don't want to do it anymore." She states she does not want to continue on the Humira and would like to go back on the Enbrel. Patient said she has improved some today

## 2022-10-11 ENCOUNTER — Other Ambulatory Visit (HOSPITAL_COMMUNITY): Payer: Self-pay

## 2022-10-11 NOTE — Telephone Encounter (Signed)
Called patient regarding Humira reaction. She states she had some redness that spread to stomach. Has been applying for hydrocortisone. 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 She is willing to try Benadryl cream since it is not absorbed systemically. Advised that headache is not expected and injection site reactions not that common but can occur.  She states she is nervous to start anything else specifically methotrexate (because of her father's experience). She states that her sister is also advising patient to stay on maintenance treatment to prevent progression of disease. I did advised that we'd like to do the same.  I briefly discussed trying monthly Simponi SQ injections if she is open but she states she is fearful. She is willing to premedicate with antihistamines daily x 3 days before next dose, the day of, and daily for 2-3 days after dose. She will follow-up if same reaction.  She has been advised to opposite of body and different injection site altogether. She verbalized understanding to all of the above  Knox Saliva, PharmD, MPH, BCPS, CPP Clinical Pharmacist (Rheumatology and Pulmonology)

## 2022-10-24 ENCOUNTER — Other Ambulatory Visit: Payer: Self-pay | Admitting: Physician Assistant

## 2022-10-24 DIAGNOSIS — M0579 Rheumatoid arthritis with rheumatoid factor of multiple sites without organ or systems involvement: Secondary | ICD-10-CM

## 2022-10-24 NOTE — Telephone Encounter (Signed)
Next Visit: 11/19/2022  Last Visit: 09/03/2022  Labs: 09/03/2022 CMP WNL CBC WNL   Eye exam: 07/12/2022   Current Dose per office note on 09/03/2022: Plaquenil 200 mg 1 tablet by mouth twice daily.  She will remain on plaquenil as prescribed.   TD:DUKGURKYHC arthritis involving multiple sites with positive rheumatoid factor   Last Fill: 07/23/2022  Okay to refill Plaquenil?

## 2022-10-29 ENCOUNTER — Ambulatory Visit: Payer: Commercial Managed Care - HMO | Admitting: Physician Assistant

## 2022-10-30 ENCOUNTER — Other Ambulatory Visit: Payer: Self-pay

## 2022-10-30 ENCOUNTER — Other Ambulatory Visit (HOSPITAL_COMMUNITY): Payer: Self-pay

## 2022-11-05 NOTE — Progress Notes (Deleted)
Office Visit Note  Patient: Courtney Kramer             Date of Birth: 1961-09-03           MRN: [id number]             PCP: Rhea Bleacher, NP Referring: Rhea Bleacher, NP Visit Date: 11/19/2022 Occupation: '@GUAROCC'$ @  Subjective:    History of Present Illness: Courtney Kramer is a 62 y.o. female with history of seropositive rheumatoid arthritis, osteoarthritis, and DDD.  Patient is currently on humira 40 mg sq injections every 14 days. She was started humira 10/08/22.   CBC and CMP updated on 09/03/22. Orders for CBC and CMP released today.   TB gold negative on 03/14/22.   Discussed the importance of holding humira if she develops signs or symptoms of an infection and to resume once the infection has completely cleared.   Activities of Daily Living:  Patient reports morning stiffness for *** {minute/hour:19697}.   Patient {ACTIONS;DENIES/REPORTS:21021675::"Denies"} nocturnal pain.  Difficulty dressing/grooming: {ACTIONS;DENIES/REPORTS:21021675::"Denies"} Difficulty climbing stairs: {ACTIONS;DENIES/REPORTS:21021675::"Denies"} Difficulty getting out of chair: {ACTIONS;DENIES/REPORTS:21021675::"Denies"} Difficulty using hands for taps, buttons, cutlery, and/or writing: {ACTIONS;DENIES/REPORTS:21021675::"Denies"}  No Rheumatology ROS completed.   PMFS History:  Patient Active Problem List   Diagnosis Date Noted   Rheumatoid arthritis involving multiple sites with positive rheumatoid factor ([place]) 04/21/2020   High risk medication use 04/21/2020   Primary osteoarthritis of both hands 04/21/2020   Primary osteoarthritis of both knees 04/21/2020   Primary osteoarthritis of both feet 04/21/2020   DDD (degenerative disc disease), lumbar 04/21/2020   Fracture, Colles, right, closed 04/09/2018   Closed fracture of right distal radius and ulna, initial encounter 03/27/2018   Lumbar radiculopathy 09/19/2016   RENAL CALCULUS 05/04/2008   FLANK PAIN, RIGHT 05/04/2008    Past Medical  History:  Diagnosis Date   Anxiety    Chronic back pain    Chronic back pain    GERD (gastroesophageal reflux disease)    History of cardiac murmur    07-26-2020  per pt happen after last pregnancy, work-up done was ok, and was told few years pcp no longer hears it   History of kidney stones    Kidney stones    OA (osteoarthritis)    hands , knees, feet   RA (rheumatoid arthritis) ([place])    rheumotology--- Dr Chauncey Cruel. Deveshwar--- multiple sites seropositive/ positive rheumotoid factor,  taking plaquinel   Right ureteral calculus    Wears glasses     Family History  Problem Relation Age of Onset   Fibromyalgia Mother    Heart disease Mother    Macular degeneration Mother    Rheum arthritis Father    Rheum arthritis Sister    Cancer Brother    Breast cancer Neg Hx    Past Surgical History:  Procedure Laterality Date   COLONOSCOPY  2012   CYSTO/  RIGHT URETERAL STENT PLACEMENT  05-10-2008   CYSTOSCOPY W/ URETERAL STENT PLACEMENT Right 06/09/2014   Procedure: CYSTOSCOPY WITH RETROGRADE PYELOGRAM/URETERAL STENT PLACEMENT;  Surgeon: Bernestine Amass, MD;  Location: [facility];  Service: Urology;  Laterality: Right;   CYSTOSCOPY W/ URETERAL STENT PLACEMENT Left 02/17/2020   Procedure: CYSTOSCOPY WITH RETROGRADE PYELOGRAM/URETERAL STENT PLACEMENT;  Surgeon: Alexis Frock, MD;  Location: [facility];  Service: Urology;  Laterality: Left;  45 MINS   CYSTOSCOPY/URETEROSCOPY/HOLMIUM LASER/STENT PLACEMENT Left 02/22/2020   Procedure: CYSTOSCOPY/LEFT URETEROSCOPY/HOLMIUM LASER/STENT EXCHANGE;  Surgeon: Irine Seal, MD;  Location: [facility];  Service:  Urology;  Laterality: Left;   CYSTOSCOPY/URETEROSCOPY/HOLMIUM LASER/STENT PLACEMENT Right 07/28/2020   Procedure: CYSTOSCOPY RIGHT URETEROSCOPY RIGHT RETROGRADE PYELOGRAM HOLMIUM LASER/STENT PLACEMENT;  Surgeon: Irine Seal, MD;  Location: [facility];  Service: Urology;  Laterality: Right;   EXTRACORPOREAL SHOCK WAVE LITHOTRIPSY  Right 05-10-2008   KNEE ARTHROSCOPY Left 2009   OPEN REDUCTION INTERNAL FIXATION (ORIF) DISTAL RADIAL FRACTURE Right 03/27/2018   Procedure: OPEN REDUCTION INTERNAL FIXATION (ORIF) RIGHT DISTAL RADIUS FRACTURE;  Surgeon: Leandrew Koyanagi, MD;  Location: [place];  Service: Orthopedics;  Laterality: Right;   WISDOM TOOTH EXTRACTION  AGE 15   Social History   Social History Narrative   Not on file    There is no immunization history on file for this patient.   Objective: Vital Signs: There were no vitals taken for this visit.   Physical Exam Vitals and nursing note reviewed.  Constitutional:      Appearance: She is well-developed.  HENT:     Head: Normocephalic and atraumatic.  Eyes:     Conjunctiva/sclera: Conjunctivae normal.  Cardiovascular:     Rate and Rhythm: Normal rate and regular rhythm.     Heart sounds: Normal heart sounds.  Pulmonary:     Effort: Pulmonary effort is normal.     Breath sounds: Normal breath sounds.  Abdominal:     General: Bowel sounds are normal.     Palpations: Abdomen is soft.  Musculoskeletal:     Cervical back: Normal range of motion.  Skin:    General: Skin is warm and dry.     Capillary Refill: Capillary refill takes less than 2 seconds.  Neurological:     Mental Status: She is alert and oriented to person, place, and time.  Psychiatric:        Behavior: Behavior normal.      Musculoskeletal Exam: ***  CDAI Exam: CDAI Score: -- Patient Global: --; Provider Global: -- Swollen: --; Tender: -- Joint Exam 11/19/2022   No joint exam has been documented for this visit   There is currently no information documented on the homunculus. Go to the Rheumatology activity and complete the homunculus joint exam.  Investigation: No additional findings.  Imaging: No results found.  Recent Labs: Lab Results  Component Value Date   WBC 6.2 09/03/2022   HGB 14.7 09/03/2022   PLT 340 09/03/2022   NA 143 09/03/2022   K 3.9  09/03/2022   CL 107 09/03/2022   CO2 29 09/03/2022   GLUCOSE 65 09/03/2022   BUN 17 09/03/2022   CREATININE 0.76 09/03/2022   BILITOT 0.4 09/03/2022   AST 20 09/03/2022   ALT 18 09/03/2022   PROT 6.7 09/03/2022   CALCIUM 9.4 09/03/2022   GFRAA 87 03/15/2021   QFTBGOLDPLUS NEGATIVE 03/14/2022    Speciality Comments: PLQ eye exam: 07/12/2022 WNL. [facility] f/u 1 year. Arava- no reponse dcd after 4 months Enbrel 04/02/22 Patient does not want to take methotrexate as she believes it caused toxicity to her father.  Procedures:  No procedures performed Allergies: Sulfa antibiotics   Assessment / Plan:     Visit Diagnoses: Rheumatoid arthritis involving multiple sites with positive rheumatoid factor (HCC)  High risk medication use  Primary osteoarthritis of both hands  Pseudogout  Effusion, right knee  Primary osteoarthritis of both knees  Primary osteoarthritis of both feet  DDD (degenerative disc disease), lumbar  Other fatigue  Closed Colles' fracture of right radius with routine healing, subsequent encounter  RENAL  CALCULUS  Essential hypertension  Family history of rheumatoid arthritis  Orders: No orders of the defined types were placed in this encounter.  No orders of the defined types were placed in this encounter.   Face-to-face time spent with patient was *** minutes. Greater than 50% of time was spent in counseling and coordination of care.  Follow-Up Instructions: No follow-ups on file.   Ofilia Neas, PA-C  Note - This record has been created using Dragon software.  Chart creation errors have been sought, but may not always  have been located. Such creation errors do not reflect on  the standard of medical care.

## 2022-11-13 ENCOUNTER — Other Ambulatory Visit (HOSPITAL_COMMUNITY): Payer: Self-pay

## 2022-11-19 ENCOUNTER — Ambulatory Visit: Payer: Commercial Managed Care - HMO | Admitting: Physician Assistant

## 2022-11-19 DIAGNOSIS — M5136 Other intervertebral disc degeneration, lumbar region: Secondary | ICD-10-CM

## 2022-11-19 DIAGNOSIS — N200 Calculus of kidney: Secondary | ICD-10-CM

## 2022-11-19 DIAGNOSIS — M1120 Other chondrocalcinosis, unspecified site: Secondary | ICD-10-CM

## 2022-11-19 DIAGNOSIS — M25461 Effusion, right knee: Secondary | ICD-10-CM

## 2022-11-19 DIAGNOSIS — M0579 Rheumatoid arthritis with rheumatoid factor of multiple sites without organ or systems involvement: Secondary | ICD-10-CM

## 2022-11-19 DIAGNOSIS — I10 Essential (primary) hypertension: Secondary | ICD-10-CM

## 2022-11-19 DIAGNOSIS — S52531D Colles' fracture of right radius, subsequent encounter for closed fracture with routine healing: Secondary | ICD-10-CM

## 2022-11-19 DIAGNOSIS — M19041 Primary osteoarthritis, right hand: Secondary | ICD-10-CM

## 2022-11-19 DIAGNOSIS — R5383 Other fatigue: Secondary | ICD-10-CM

## 2022-11-19 DIAGNOSIS — Z79899 Other long term (current) drug therapy: Secondary | ICD-10-CM

## 2022-11-19 DIAGNOSIS — Z8261 Family history of arthritis: Secondary | ICD-10-CM

## 2022-11-19 DIAGNOSIS — M170 Bilateral primary osteoarthritis of knee: Secondary | ICD-10-CM

## 2022-11-19 DIAGNOSIS — M19072 Primary osteoarthritis, left ankle and foot: Secondary | ICD-10-CM

## 2022-11-19 NOTE — Progress Notes (Unsigned)
Office Visit Note  Patient: Courtney Kramer             Date of Birth: 1961/07/14           MRN: [id number]             PCP: Rhea Bleacher, NP Referring: Rhea Bleacher, NP Visit Date: 11/22/2022 Occupation: '@GUAROCC'$ @  Subjective:  No chief complaint on file.   History of Present Illness: Courtney Kramer is a 62 y.o. female with history of seropositive rheumatoid arthritis, osteoarthritis, and DDD.  Patient is currently on humira 40 mg sq injections every 14 days. She was started humira 10/08/22.   CBC and CMP updated on 09/03/22. Orders for CBC and CMP released today.   TB gold negative on 03/14/22.   Discussed the importance of holding humira if she develops signs or symptoms of an infection and to resume once the infection has completely cleared.     Activities of Daily Living:  Patient reports morning stiffness for *** {minute/hour:19697}.   Patient {ACTIONS;DENIES/REPORTS:21021675::"Denies"} nocturnal pain.  Difficulty dressing/grooming: {ACTIONS;DENIES/REPORTS:21021675::"Denies"} Difficulty climbing stairs: {ACTIONS;DENIES/REPORTS:21021675::"Denies"} Difficulty getting out of chair: {ACTIONS;DENIES/REPORTS:21021675::"Denies"} Difficulty using hands for taps, buttons, cutlery, and/or writing: {ACTIONS;DENIES/REPORTS:21021675::"Denies"}  No Rheumatology ROS completed.   PMFS History:  Patient Active Problem List   Diagnosis Date Noted   Rheumatoid arthritis involving multiple sites with positive rheumatoid factor ([place]) 04/21/2020   High risk medication use 04/21/2020   Primary osteoarthritis of both hands 04/21/2020   Primary osteoarthritis of both knees 04/21/2020   Primary osteoarthritis of both feet 04/21/2020   DDD (degenerative disc disease), lumbar 04/21/2020   Fracture, Colles, right, closed 04/09/2018   Closed fracture of right distal radius and ulna, initial encounter 03/27/2018   Lumbar radiculopathy 09/19/2016   RENAL CALCULUS 05/04/2008   FLANK PAIN,  RIGHT 05/04/2008    Past Medical History:  Diagnosis Date   Anxiety    Chronic back pain    Chronic back pain    GERD (gastroesophageal reflux disease)    History of cardiac murmur    07-26-2020  per pt happen after last pregnancy, work-up done was ok, and was told few years pcp no longer hears it   History of kidney stones    Kidney stones    OA (osteoarthritis)    hands , knees, feet   RA (rheumatoid arthritis) ([place])    rheumotology--- Dr Chauncey Cruel. Deveshwar--- multiple sites seropositive/ positive rheumotoid factor,  taking plaquinel   Right ureteral calculus    Wears glasses     Family History  Problem Relation Age of Onset   Fibromyalgia Mother    Heart disease Mother    Macular degeneration Mother    Rheum arthritis Father    Rheum arthritis Sister    Cancer Brother    Breast cancer Neg Hx    Past Surgical History:  Procedure Laterality Date   COLONOSCOPY  2012   CYSTO/  RIGHT URETERAL STENT PLACEMENT  05-10-2008   CYSTOSCOPY W/ URETERAL STENT PLACEMENT Right 06/09/2014   Procedure: CYSTOSCOPY WITH RETROGRADE PYELOGRAM/URETERAL STENT PLACEMENT;  Surgeon: Bernestine Amass, MD;  Location: [facility];  Service: Urology;  Laterality: Right;   CYSTOSCOPY W/ URETERAL STENT PLACEMENT Left 02/17/2020   Procedure: CYSTOSCOPY WITH RETROGRADE PYELOGRAM/URETERAL STENT PLACEMENT;  Surgeon: Alexis Frock, MD;  Location: [facility];  Service: Urology;  Laterality: Left;  45 MINS   CYSTOSCOPY/URETEROSCOPY/HOLMIUM LASER/STENT PLACEMENT Left 02/22/2020   Procedure: CYSTOSCOPY/LEFT URETEROSCOPY/HOLMIUM LASER/STENT EXCHANGE;  Surgeon: Irine Seal,  MD;  Location: [facility];  Service: Urology;  Laterality: Left;   CYSTOSCOPY/URETEROSCOPY/HOLMIUM LASER/STENT PLACEMENT Right 07/28/2020   Procedure: CYSTOSCOPY RIGHT URETEROSCOPY RIGHT RETROGRADE PYELOGRAM HOLMIUM LASER/STENT PLACEMENT;  Surgeon: Irine Seal, MD;  Location: [facility];  Service: Urology;  Laterality: Right;    EXTRACORPOREAL SHOCK WAVE LITHOTRIPSY Right 05-10-2008   KNEE ARTHROSCOPY Left 2009   OPEN REDUCTION INTERNAL FIXATION (ORIF) DISTAL RADIAL FRACTURE Right 03/27/2018   Procedure: OPEN REDUCTION INTERNAL FIXATION (ORIF) RIGHT DISTAL RADIUS FRACTURE;  Surgeon: Leandrew Koyanagi, MD;  Location: [place];  Service: Orthopedics;  Laterality: Right;   WISDOM TOOTH EXTRACTION  AGE 71   Social History   Social History Narrative   Not on file    There is no immunization history on file for this patient.   Objective: Vital Signs: There were no vitals taken for this visit.   Physical Exam   Musculoskeletal Exam: ***  CDAI Exam: CDAI Score: -- Patient Global: --; Provider Global: -- Swollen: --; Tender: -- Joint Exam 11/22/2022   No joint exam has been documented for this visit   There is currently no information documented on the homunculus. Go to the Rheumatology activity and complete the homunculus joint exam.  Investigation: No additional findings.  Imaging: No results found.  Recent Labs: Lab Results  Component Value Date   WBC 6.2 09/03/2022   HGB 14.7 09/03/2022   PLT 340 09/03/2022   NA 143 09/03/2022   K 3.9 09/03/2022   CL 107 09/03/2022   CO2 29 09/03/2022   GLUCOSE 65 09/03/2022   BUN 17 09/03/2022   CREATININE 0.76 09/03/2022   BILITOT 0.4 09/03/2022   AST 20 09/03/2022   ALT 18 09/03/2022   PROT 6.7 09/03/2022   CALCIUM 9.4 09/03/2022   GFRAA 87 03/15/2021   QFTBGOLDPLUS NEGATIVE 03/14/2022    Speciality Comments: PLQ eye exam: 07/12/2022 WNL. [facility] f/u 1 year. Arava- no reponse dcd after 4 months Enbrel 04/02/22 Patient does not want to take methotrexate as she believes it caused toxicity to her father.  Procedures:  No procedures performed Allergies: Sulfa antibiotics   Assessment / Plan:     Visit Diagnoses: Rheumatoid arthritis involving multiple sites with positive rheumatoid factor (HCC)  High risk medication  use  Primary osteoarthritis of both hands  Pseudogout  Effusion, right knee  Primary osteoarthritis of both knees  Primary osteoarthritis of both feet  DDD (degenerative disc disease), lumbar  Other fatigue  Closed Colles' fracture of right radius with routine healing, subsequent encounter  RENAL CALCULUS  Essential hypertension  Family history of rheumatoid arthritis  Orders: No orders of the defined types were placed in this encounter.  No orders of the defined types were placed in this encounter.   Face-to-face time spent with patient was *** minutes. Greater than 50% of time was spent in counseling and coordination of care.  Follow-Up Instructions: No follow-ups on file.   Ofilia Neas, PA-C  Note - This record has been created using Dragon software.  Chart creation errors have been sought, but may not always  have been located. Such creation errors do not reflect on  the standard of medical care.

## 2022-11-21 ENCOUNTER — Other Ambulatory Visit (HOSPITAL_COMMUNITY): Payer: Self-pay

## 2022-11-22 ENCOUNTER — Encounter: Payer: Self-pay | Admitting: Physician Assistant

## 2022-11-22 ENCOUNTER — Ambulatory Visit: Payer: Commercial Managed Care - HMO | Attending: Physician Assistant | Admitting: Physician Assistant

## 2022-11-22 VITALS — BP 122/75 | HR 87 | Resp 16 | Ht 66.0 in | Wt 193.0 lb

## 2022-11-22 DIAGNOSIS — M0579 Rheumatoid arthritis with rheumatoid factor of multiple sites without organ or systems involvement: Secondary | ICD-10-CM | POA: Diagnosis not present

## 2022-11-22 DIAGNOSIS — M5136 Other intervertebral disc degeneration, lumbar region: Secondary | ICD-10-CM

## 2022-11-22 DIAGNOSIS — M170 Bilateral primary osteoarthritis of knee: Secondary | ICD-10-CM

## 2022-11-22 DIAGNOSIS — M19042 Primary osteoarthritis, left hand: Secondary | ICD-10-CM

## 2022-11-22 DIAGNOSIS — I10 Essential (primary) hypertension: Secondary | ICD-10-CM

## 2022-11-22 DIAGNOSIS — Z79899 Other long term (current) drug therapy: Secondary | ICD-10-CM | POA: Diagnosis not present

## 2022-11-22 DIAGNOSIS — M1120 Other chondrocalcinosis, unspecified site: Secondary | ICD-10-CM | POA: Diagnosis not present

## 2022-11-22 DIAGNOSIS — R5383 Other fatigue: Secondary | ICD-10-CM

## 2022-11-22 DIAGNOSIS — M25461 Effusion, right knee: Secondary | ICD-10-CM

## 2022-11-22 DIAGNOSIS — M19071 Primary osteoarthritis, right ankle and foot: Secondary | ICD-10-CM

## 2022-11-22 DIAGNOSIS — Z8261 Family history of arthritis: Secondary | ICD-10-CM

## 2022-11-22 DIAGNOSIS — S52531D Colles' fracture of right radius, subsequent encounter for closed fracture with routine healing: Secondary | ICD-10-CM

## 2022-11-22 DIAGNOSIS — M19072 Primary osteoarthritis, left ankle and foot: Secondary | ICD-10-CM

## 2022-11-22 DIAGNOSIS — M19041 Primary osteoarthritis, right hand: Secondary | ICD-10-CM | POA: Diagnosis not present

## 2022-11-22 DIAGNOSIS — Z111 Encounter for screening for respiratory tuberculosis: Secondary | ICD-10-CM

## 2022-11-22 DIAGNOSIS — N200 Calculus of kidney: Secondary | ICD-10-CM

## 2022-11-23 LAB — COMPLETE METABOLIC PANEL WITH GFR
AG Ratio: 1.8 (calc) (ref 1.0–2.5)
ALT: 15 U/L (ref 6–29)
AST: 22 U/L (ref 10–35)
Albumin: 4.5 g/dL (ref 3.6–5.1)
Alkaline phosphatase (APISO): 100 U/L (ref 37–153)
BUN: 16 mg/dL (ref 7–25)
CO2: 27 mmol/L (ref 20–32)
Calcium: 9.4 mg/dL (ref 8.6–10.4)
Chloride: 108 mmol/L (ref 98–110)
Creat: 0.89 mg/dL (ref 0.50–1.05)
Globulin: 2.5 g/dL (calc) (ref 1.9–3.7)
Glucose, Bld: 97 mg/dL (ref 65–99)
Potassium: 4.7 mmol/L (ref 3.5–5.3)
Sodium: 144 mmol/L (ref 135–146)
Total Bilirubin: 0.6 mg/dL (ref 0.2–1.2)
Total Protein: 7 g/dL (ref 6.1–8.1)
eGFR: 74 mL/min/{1.73_m2} (ref >=60)

## 2022-11-23 LAB — CBC WITH DIFFERENTIAL/PLATELET
Absolute Monocytes: 513 cells/uL (ref 200–950)
Basophils Absolute: 40 cells/uL (ref 0–200)
Basophils Relative: 0.7 %
Eosinophils Absolute: 51 cells/uL (ref 15–500)
Eosinophils Relative: 0.9 %
HCT: 42.8 % (ref 35.0–45.0)
Hemoglobin: 14.5 g/dL (ref 11.7–15.5)
Lymphs Abs: 1858 cells/uL (ref 850–3900)
MCH: 30.1 pg (ref 27.0–33.0)
MCHC: 33.9 g/dL (ref 32.0–36.0)
MCV: 89 fL (ref 80.0–100.0)
MPV: 10.6 fL (ref 7.5–12.5)
Monocytes Relative: 9 %
Neutro Abs: 3238 cells/uL (ref 1500–7800)
Neutrophils Relative %: 56.8 %
Platelets: 306 10*3/uL (ref 140–400)
RBC: 4.81 10*6/uL (ref 3.80–5.10)
RDW: 11.4 % (ref 11.0–15.0)
Total Lymphocyte: 32.6 %
WBC: 5.7 10*3/uL (ref 3.8–10.8)

## 2022-11-23 NOTE — Progress Notes (Signed)
CBC and CMP WNL

## 2022-12-12 ENCOUNTER — Other Ambulatory Visit (HOSPITAL_COMMUNITY): Payer: Self-pay

## 2022-12-18 ENCOUNTER — Other Ambulatory Visit: Payer: Self-pay

## 2023-01-10 ENCOUNTER — Other Ambulatory Visit: Payer: Self-pay

## 2023-01-10 ENCOUNTER — Other Ambulatory Visit (HOSPITAL_COMMUNITY): Payer: Self-pay

## 2023-01-10 ENCOUNTER — Other Ambulatory Visit: Payer: Self-pay | Admitting: Physician Assistant

## 2023-01-10 DIAGNOSIS — Z79899 Other long term (current) drug therapy: Secondary | ICD-10-CM

## 2023-01-10 DIAGNOSIS — M0579 Rheumatoid arthritis with rheumatoid factor of multiple sites without organ or systems involvement: Secondary | ICD-10-CM

## 2023-01-10 MED ORDER — HUMIRA (2 PEN) 40 MG/0.4ML ~~LOC~~ AJKT
40.0000 mg | AUTO-INJECTOR | SUBCUTANEOUS | 0 refills | Status: DC
Start: 2023-01-10 — End: 2023-04-15
  Filled 2023-01-10: qty 6, 84d supply, fill #0

## 2023-01-10 NOTE — Telephone Encounter (Signed)
Last Fill: 10/08/2022  Labs: 11/22/2022 CBC and CMP WNL   TB Gold: 03/15/2023 Neg    Next Visit: 02/28/2023  Last Visit: 11/22/2022  [id number] arthritis involving multiple sites with positive rheumatoid factor   Current Dose per office note 11/22/2022: Humira 40 mg sq injections every 14 days   Okay to refill Humira?

## 2023-01-14 ENCOUNTER — Other Ambulatory Visit (HOSPITAL_COMMUNITY): Payer: Self-pay

## 2023-01-21 ENCOUNTER — Other Ambulatory Visit: Payer: Self-pay

## 2023-01-23 ENCOUNTER — Other Ambulatory Visit: Payer: Self-pay | Admitting: Physician Assistant

## 2023-01-23 DIAGNOSIS — M0579 Rheumatoid arthritis with rheumatoid factor of multiple sites without organ or systems involvement: Secondary | ICD-10-CM

## 2023-01-23 NOTE — Telephone Encounter (Signed)
Last Fill: 10/24/2022   Eye exam: 07/12/2022 WNL.    Labs: 11/22/2022 CBC and CMP WNL   Next Visit: 02/28/2023   Last Visit: 11/22/2022  DX: Rheumatoid arthritis involving multiple sites with positive rheumatoid factor   Current Dose per office note 11/22/2022: plaquenil 200 mg 1 tablet by mouth twice daily   Okay to refill Plaquenil?

## 2023-02-13 ENCOUNTER — Other Ambulatory Visit (HOSPITAL_COMMUNITY): Payer: Self-pay

## 2023-02-13 ENCOUNTER — Other Ambulatory Visit: Payer: Self-pay | Admitting: Physician Assistant

## 2023-02-14 NOTE — Progress Notes (Signed)
Office Visit Note  Patient: Courtney Kramer             Date of Birth: Aug 25, 1961           MRN: [id number]             PCP: Erskine Emery, NP Referring: Erskine Emery, NP Visit Date: 02/28/2023 Occupation: @GUAROCC @  Subjective:  Medication monitoring   History of Present Illness: Courtney Kramer is a 62 y.o. female with history of seropositive rheumatoid arthritis and osteoarthritis.  Patient remains on Humira 40 mg sq injections every 14 days and plaquenil 200 mg 1 tablet by mouth twice daily.  She is tolerating combination therapy without any side effects or injection site reactions from Humira.  She has not missed any doses recently.  She has not had any recent or recurrent infections.  Overall she has noticed about an 80% improvement in her joint pain and inflammation since adding on Humira as combination therapy.  She states that her knee joint pain and inflammation have improved.  Her mobility has improved.  She is no longer having to use a cane.  She continues to have soreness and stiffness in the third PIP joints of both hands but has no active inflammation at this time.  She takes Tylenol as needed for pain relief.   Activities of Daily Living:  Patient reports morning stiffness for 30 minutes.   Patient Denies nocturnal pain.  Difficulty dressing/grooming: Denies Difficulty climbing stairs: Reports Difficulty getting out of chair: Reports Difficulty using hands for taps, buttons, cutlery, and/or writing: Denies  Review of Systems  Constitutional:  Positive for fatigue.  HENT:  Negative for mouth sores and mouth dryness.   Eyes:  Positive for dryness.  Respiratory:  Negative for shortness of breath.   Cardiovascular:  Negative for chest pain and palpitations.  Gastrointestinal:  Negative for blood in stool, constipation and diarrhea.  Endocrine: Negative for increased urination.  Genitourinary:  Negative for involuntary urination.  Musculoskeletal:  Positive for  joint pain, gait problem, joint pain, joint swelling, muscle weakness and morning stiffness. Negative for myalgias, muscle tenderness and myalgias.  Skin:  Positive for rash. Negative for color change, hair loss and sensitivity to sunlight.  Allergic/Immunologic: Negative for susceptible to infections.  Neurological:  Negative for dizziness and headaches.  Hematological:  Negative for swollen glands.  Psychiatric/Behavioral:  Negative for depressed mood and sleep disturbance. The patient is nervous/anxious.     PMFS History:  Patient Active Problem List   Diagnosis Date Noted   Rheumatoid arthritis involving multiple sites with positive rheumatoid factor (HCC) 04/21/2020   High risk medication use 04/21/2020   Primary osteoarthritis of both hands 04/21/2020   Primary osteoarthritis of both knees 04/21/2020   Primary osteoarthritis of both feet 04/21/2020   DDD (degenerative disc disease), lumbar 04/21/2020   Fracture, Colles, right, closed 04/09/2018   Closed fracture of right distal radius and ulna, initial encounter 03/27/2018   Lumbar radiculopathy 09/19/2016   RENAL CALCULUS 05/04/2008   FLANK PAIN, RIGHT 05/04/2008    Past Medical History:  Diagnosis Date   Anxiety    Chronic back pain    Chronic back pain    GERD (gastroesophageal reflux disease)    History of cardiac murmur    07-26-2020  per pt happen after last pregnancy, work-up done was ok, and was told few years pcp no longer hears it   History of kidney stones    Kidney stones  OA (osteoarthritis)    hands , knees, feet   RA (rheumatoid arthritis) (HCC)    rheumotology--- Dr Kathie Rhodes. Deveshwar--- multiple sites seropositive/ positive rheumotoid factor,  taking plaquinel   Right ureteral calculus    Wears glasses     Family History  Problem Relation Age of Onset   Fibromyalgia Mother    Heart disease Mother    Macular degeneration Mother    Rheum arthritis Father    Rheum arthritis Sister    Cancer Brother     Breast cancer Neg Hx    Past Surgical History:  Procedure Laterality Date   COLONOSCOPY  2012   CYSTO/  RIGHT URETERAL STENT PLACEMENT  05-10-2008   CYSTOSCOPY W/ URETERAL STENT PLACEMENT Right 06/09/2014   Procedure: CYSTOSCOPY WITH RETROGRADE PYELOGRAM/URETERAL STENT PLACEMENT;  Surgeon: Valetta Fuller, MD;  Location: [facility];  Service: Urology;  Laterality: Right;   CYSTOSCOPY W/ URETERAL STENT PLACEMENT Left 02/17/2020   Procedure: CYSTOSCOPY WITH RETROGRADE PYELOGRAM/URETERAL STENT PLACEMENT;  Surgeon: Sebastian Ache, MD;  Location: [facility];  Service: Urology;  Laterality: Left;  45 MINS   CYSTOSCOPY/URETEROSCOPY/HOLMIUM LASER/STENT PLACEMENT Left 02/22/2020   Procedure: CYSTOSCOPY/LEFT URETEROSCOPY/HOLMIUM LASER/STENT EXCHANGE;  Surgeon: [name], MD;  Location: [facility];  Service: Urology;  Laterality: Left;   CYSTOSCOPY/URETEROSCOPY/HOLMIUM LASER/STENT PLACEMENT Right 07/28/2020   Procedure: CYSTOSCOPY RIGHT URETEROSCOPY RIGHT RETROGRADE PYELOGRAM HOLMIUM LASER/STENT PLACEMENT;  Surgeon: [name], MD;  Location[facility];  Service: Urology;  Laterality: Right;   EXTRACORPOREAL SHOCK WAVE LITHOTRIPSY Right 05-10-2008   KNEE ARTHROSCOPY Left 2009   OPEN REDUCTION INTERNAL FIXATION (ORIF) DISTAL RADIAL FRACTURE Right 03/27/2018   Procedure: OPEN REDUCTION INTERNAL FIXATION (ORIF) RIGHT DISTAL RADIUS FRACTURE;  Surgeon: Tarry Kos, MD;  [facility];  Service: Orthopedics;  Laterality: Right;   WISDOM TOOTH EXTRACTION  AGE 44   Social History   Social History Narrative   Not on file    There is no immunization history on file for this patient.   Objective: Vital Signs: BP (!) 179/94 (BP Location: Right Arm, Patient Position: Sitting, Cuff Size: Large)   Pulse 71   Resp 16   Ht 5\' 6"  (1.676 m)   Wt 196 lb (88.9 kg)   BMI 31.64 kg/m    Physical Exam Vitals and nursing note reviewed.  Constitutional:       Appearance: She is well-developed.  HENT:     Head: Normocephalic and atraumatic.  Eyes:     Conjunctiva/sclera: Conjunctivae normal.  Cardiovascular:     Rate and Rhythm: Normal rate and regular rhythm.     Heart sounds: Normal heart sounds.  Pulmonary:     Effort: Pulmonary effort is normal.     Breath sounds: Normal breath sounds.  Abdominal:     General: Bowel sounds are normal.     Palpations: Abdomen is soft.  Musculoskeletal:     Cervical back: Normal range of motion.  Lymphadenopathy:     Cervical: No cervical adenopathy.  Skin:    General: Skin is warm and dry.     Capillary Refill: Capillary refill takes less than 2 seconds.  Neurological:     Mental Status: She is alert and oriented to person, place, and time.  Psychiatric:        Behavior: Behavior normal.      Musculoskeletal Exam: C-spine, thoracic spine, lumbar spine have good range of motion.  Shoulder joints, elbow joints, wrist joints, MCPs, PIPs, DIPs have good  range of motion with no synovitis.  PIP and DIP thickening.  Some tenderness over bilateral third PIP joints.  Hip joints have good range of motion with no groin pain.  Knee joints have good range of motion with no warmth or effusion.  Ankle joints have good range of motion with no tenderness or joint swelling.  CDAI Exam: CDAI Score: -- Patient Global: 2 / 100; Provider Global: 2 / 100 Swollen: --; Tender: -- Joint Exam 02/28/2023   No joint exam has been documented for this visit   There is currently no information documented on the homunculus. Go to the Rheumatology activity and complete the homunculus joint exam.  Investigation: No additional findings.  Imaging: No results found.  Recent Labs: Lab Results  Component Value Date   WBC 5.7 11/22/2022   HGB 14.5 11/22/2022   PLT 306 11/22/2022   NA 144 11/22/2022   K 4.7 11/22/2022   CL 108 11/22/2022   CO2 27 11/22/2022   GLUCOSE 97 11/22/2022   BUN 16 11/22/2022   CREATININE 0.89  11/22/2022   BILITOT 0.6 11/22/2022   AST 22 11/22/2022   ALT 15 11/22/2022   PROT 7.0 11/22/2022   CALCIUM 9.4 11/22/2022   GFRAA 87 03/15/2021   QFTBGOLDPLUS NEGATIVE 03/14/2022    Speciality Comments: PLQ eye exam: 07/12/2022 WNL. [facility] f/u 1 year. Arava- no reponse dcd after 4 months Enbrel 04/02/22 Patient does not want to take methotrexate as she believes it caused toxicity to her father.  Procedures:  No procedures performed Allergies: Sulfa antibiotics     Assessment / Plan:     Visit Diagnoses: Rheumatoid arthritis involving multiple sites with positive rheumatoid factor (HCC): She has no synovitis on examination today.  She has noticed over an 80% improvement in her joint pain and inflammation since adding on Humira as combination therapy.  She is been injecting marrow 40 mg subcutaneously once every 14 days and is taking Plaquenil 200 mg 1 tablet by mouth twice daily.  She is tolerating combination therapy without any side effects or injection site reactions.  She has not missed any doses recently.  She has not had any recent or recurrent infections.  Her mobility has improved and she is no longer using a cane to assist with ambulation.  She is been taking Tylenol as needed for pain relief.  No medication changes will be made at this time.  She was advised to notify us if she develops signs or symptoms of a flare.  She will follow-up in the office in 3 months or sooner if needed.  High risk medication use - Humira 40 mg sq injections every 14 days and plaquenil 200 mg 1 tablet by mouth twice daily. TB gold negative on 03/14/22.  CBC and CMP updated on 11/21/21. Orders for CBC and CMP released today.  Her next lab work will be due in September and every 3 months.  Discussed the importance of holding Humira if she develops signs or symptoms of an infection and to resume once the infection has completely cleared. PLQ eye exam: 07/12/2022 WNL. [facility] f/u 1 year.   -  Plan: COMPLETE METABOLIC PANEL WITH GFR, CBC with Differential/Platelet, QuantiFERON-TB Gold Plus  Screening for tuberculosis - TB gold released today. Plan: QuantiFERON-TB Gold Plus  Primary osteoarthritis of both hands: PIP and DIP thickening consistent with osteoarthritis of both hands.  Tenderness over bilateral third PIP joints but no synovitis noted.  Pseudogout: No signs or symptoms  of a pseudogout flare recently.  Effusion, right knee: Resolved.  Primary osteoarthritis of both knees: Good range of motion of both knee joints on examination today.  No warmth or effusion noted.  Primary osteoarthritis of both feet: She is not experiencing any increased discomfort in her feet currently.  Ankle joints have good range of motion with no tenderness or joint swelling.  DDD (degenerative disc disease), lumbar: She is not experiencing any increased discomfort in her lower back currently.  Other fatigue: Stable.   Other medical conditions are listed as follows:   Closed Colles' fracture of right radius with routine healing, subsequent encounter  RENAL CALCULUS  Essential hypertension: Pressure was elevated today in the office and was rechecked prior to leaving.  Patient was advised to monitor blood pressure closely and to reach out to her PCP if it remains elevated.   Family history of rheumatoid arthritis    Orders: Orders Placed This Encounter  Procedures   COMPLETE METABOLIC PANEL WITH GFR   CBC with Differential/Platelet   QuantiFERON-TB Gold Plus   No orders of the defined types were placed in this encounter.   Follow-Up Instructions: Return in about 3 months (around 05/31/2023) for Rheumatoid arthritis, Osteoarthritis.   Gearldine Bienenstock, PA-C  Note - This record has been created using Dragon software.  Chart creation errors have been sought, but may not always  have been located. Such creation errors do not reflect on  the standard of medical care.

## 2023-02-22 ENCOUNTER — Other Ambulatory Visit: Payer: Self-pay | Admitting: Obstetrics and Gynecology

## 2023-02-22 DIAGNOSIS — Z1231 Encounter for screening mammogram for malignant neoplasm of breast: Secondary | ICD-10-CM

## 2023-02-28 ENCOUNTER — Ambulatory Visit: Payer: Commercial Managed Care - HMO | Attending: Physician Assistant | Admitting: Physician Assistant

## 2023-02-28 ENCOUNTER — Telehealth: Payer: Self-pay | Admitting: Rheumatology

## 2023-02-28 ENCOUNTER — Encounter: Payer: Self-pay | Admitting: Physician Assistant

## 2023-02-28 VITALS — BP 179/94 | HR 71 | Resp 16 | Ht 66.0 in | Wt 196.0 lb

## 2023-02-28 DIAGNOSIS — Z8261 Family history of arthritis: Secondary | ICD-10-CM

## 2023-02-28 DIAGNOSIS — I10 Essential (primary) hypertension: Secondary | ICD-10-CM

## 2023-02-28 DIAGNOSIS — M0579 Rheumatoid arthritis with rheumatoid factor of multiple sites without organ or systems involvement: Secondary | ICD-10-CM | POA: Diagnosis not present

## 2023-02-28 DIAGNOSIS — N200 Calculus of kidney: Secondary | ICD-10-CM

## 2023-02-28 DIAGNOSIS — M19072 Primary osteoarthritis, left ankle and foot: Secondary | ICD-10-CM

## 2023-02-28 DIAGNOSIS — Z79899 Other long term (current) drug therapy: Secondary | ICD-10-CM

## 2023-02-28 DIAGNOSIS — M19042 Primary osteoarthritis, left hand: Secondary | ICD-10-CM

## 2023-02-28 DIAGNOSIS — M19071 Primary osteoarthritis, right ankle and foot: Secondary | ICD-10-CM

## 2023-02-28 DIAGNOSIS — M1120 Other chondrocalcinosis, unspecified site: Secondary | ICD-10-CM | POA: Diagnosis not present

## 2023-02-28 DIAGNOSIS — M5136 Other intervertebral disc degeneration, lumbar region: Secondary | ICD-10-CM

## 2023-02-28 DIAGNOSIS — Z111 Encounter for screening for respiratory tuberculosis: Secondary | ICD-10-CM

## 2023-02-28 DIAGNOSIS — M19041 Primary osteoarthritis, right hand: Secondary | ICD-10-CM

## 2023-02-28 DIAGNOSIS — M170 Bilateral primary osteoarthritis of knee: Secondary | ICD-10-CM

## 2023-02-28 DIAGNOSIS — R5383 Other fatigue: Secondary | ICD-10-CM

## 2023-02-28 DIAGNOSIS — M25461 Effusion, right knee: Secondary | ICD-10-CM

## 2023-02-28 DIAGNOSIS — S52531D Colles' fracture of right radius, subsequent encounter for closed fracture with routine healing: Secondary | ICD-10-CM

## 2023-02-28 LAB — CBC WITH DIFFERENTIAL/PLATELET
HCT: 43.2 % (ref 35.0–45.0)
Lymphs Abs: 2115 cells/uL (ref 850–3900)
MPV: 10.8 fL (ref 7.5–12.5)
RDW: 11.9 % (ref 11.0–15.0)
WBC: 5.3 10*3/uL (ref 3.8–10.8)

## 2023-02-28 NOTE — Telephone Encounter (Signed)
error 

## 2023-03-01 LAB — COMPLETE METABOLIC PANEL WITH GFR
AST: 25 U/L (ref 10–35)
Albumin: 4.2 g/dL (ref 3.6–5.1)
Calcium: 9.4 mg/dL (ref 8.6–10.4)
eGFR: 89 mL/min/{1.73_m2} (ref >=60)

## 2023-03-01 LAB — CBC WITH DIFFERENTIAL/PLATELET
Eosinophils Absolute: 148 cells/uL (ref 15–500)
MCH: 30.4 pg (ref 27.0–33.0)
Total Lymphocyte: 39.9 %

## 2023-03-01 NOTE — Progress Notes (Signed)
CBC and CMP WNL

## 2023-03-02 LAB — CBC WITH DIFFERENTIAL/PLATELET
Absolute Monocytes: 429 cells/uL (ref 200–950)
Basophils Absolute: 69 cells/uL (ref 0–200)
Basophils Relative: 1.3 %
Eosinophils Relative: 2.8 %
Hemoglobin: 14.6 g/dL (ref 11.7–15.5)
MCHC: 33.8 g/dL (ref 32.0–36.0)
MCV: 90 fL (ref 80.0–100.0)
Monocytes Relative: 8.1 %
Neutro Abs: 2539 cells/uL (ref 1500–7800)
Neutrophils Relative %: 47.9 %
Platelets: 287 10*3/uL (ref 140–400)
RBC: 4.8 10*6/uL (ref 3.80–5.10)

## 2023-03-02 LAB — QUANTIFERON-TB GOLD PLUS
Mitogen-NIL: >10 IU/mL
NIL: 0.03 IU/mL
QuantiFERON-TB Gold Plus: NEGATIVE
TB1-NIL: 0.01 IU/mL
TB2-NIL: 0 IU/mL

## 2023-03-02 LAB — COMPLETE METABOLIC PANEL WITH GFR
AG Ratio: 1.6 (calc) (ref 1.0–2.5)
ALT: 17 U/L (ref 6–29)
Alkaline phosphatase (APISO): 96 U/L (ref 37–153)
BUN: 13 mg/dL (ref 7–25)
CO2: 29 mmol/L (ref 20–32)
Chloride: 106 mmol/L (ref 98–110)
Creat: 0.76 mg/dL (ref 0.50–1.05)
Globulin: 2.6 g/dL (calc) (ref 1.9–3.7)
Glucose, Bld: 85 mg/dL (ref 65–99)
Potassium: 4.3 mmol/L (ref 3.5–5.3)
Sodium: 142 mmol/L (ref 135–146)
Total Bilirubin: 0.7 mg/dL (ref 0.2–1.2)
Total Protein: 6.8 g/dL (ref 6.1–8.1)

## 2023-03-04 NOTE — Progress Notes (Signed)
TB gold negative

## 2023-03-07 ENCOUNTER — Ambulatory Visit
Admission: RE | Admit: 2023-03-07 | Discharge: 2023-03-07 | Disposition: A | Discharge status: Home / Self Care | Payer: Commercial Managed Care - HMO | Source: Ambulatory Visit | Attending: Obstetrics and Gynecology | Admitting: Obstetrics and Gynecology

## 2023-03-07 DIAGNOSIS — Z1231 Encounter for screening mammogram for malignant neoplasm of breast: Secondary | ICD-10-CM

## 2023-03-29 ENCOUNTER — Other Ambulatory Visit (HOSPITAL_COMMUNITY): Payer: Self-pay

## 2023-04-09 ENCOUNTER — Other Ambulatory Visit (HOSPITAL_COMMUNITY): Payer: Self-pay

## 2023-04-15 ENCOUNTER — Other Ambulatory Visit: Payer: Self-pay | Admitting: Physician Assistant

## 2023-04-15 ENCOUNTER — Other Ambulatory Visit: Payer: Self-pay

## 2023-04-15 ENCOUNTER — Other Ambulatory Visit (HOSPITAL_COMMUNITY): Payer: Self-pay

## 2023-04-15 DIAGNOSIS — Z79899 Other long term (current) drug therapy: Secondary | ICD-10-CM

## 2023-04-15 DIAGNOSIS — M0579 Rheumatoid arthritis with rheumatoid factor of multiple sites without organ or systems involvement: Secondary | ICD-10-CM

## 2023-04-15 MED ORDER — HUMIRA (2 PEN) 40 MG/0.4ML ~~LOC~~ AJKT
40.0000 mg | AUTO-INJECTOR | SUBCUTANEOUS | 0 refills | Status: DC
Start: 2023-04-15 — End: 2023-08-01
  Filled 2023-04-15: qty 6, 84d supply, fill #0

## 2023-04-15 NOTE — Telephone Encounter (Signed)
Last Fill: 01/10/2023  Labs: 02/28/2023 CBC and CMP WNL.   TB Gold: 02/28/2023 Neg    Next Visit: 05/27/2023  Last Visit: 02/28/2023  DX: Rheumatoid arthritis involving multiple sites with positive rheumatoid factor   Current Dose per office note 02/28/2023: Humira 40 mg sq injections every 14 days   Okay to refill Humira?

## 2023-04-18 ENCOUNTER — Other Ambulatory Visit (HOSPITAL_COMMUNITY): Payer: Self-pay

## 2023-04-23 ENCOUNTER — Other Ambulatory Visit: Payer: Self-pay

## 2023-04-28 ENCOUNTER — Other Ambulatory Visit: Payer: Self-pay | Admitting: Physician Assistant

## 2023-04-28 DIAGNOSIS — M0579 Rheumatoid arthritis with rheumatoid factor of multiple sites without organ or systems involvement: Secondary | ICD-10-CM

## 2023-04-29 NOTE — Telephone Encounter (Signed)
Last Fill: 01/23/2023  Eye exam: 07/12/2022 WNL.    Labs: 02/28/2023 CBC and CMP WNL   Next Visit: 05/27/2023  Last Visit: 02/28/2023  [id number] arthritis involving multiple sites with positive rheumatoid factor   Current Dose per office note 02/28/2023: plaquenil 200 mg 1 tablet by mouth twice daily.   Okay to refill Plaquenil?

## 2023-05-13 NOTE — Progress Notes (Signed)
Office Visit Note  Patient: Courtney Kramer             Date of Birth: March 31, 1961           MRN: [id number]             PCP: Erskine Emery, NP Referring: Erskine Emery, NP Visit Date: 05/27/2023 Occupation: @GUAROCC @  Subjective:  Pain in multiple joints   History of Present Illness: Courtney Kramer is a 62 y.o. female with history of seropositive rheumatoid arthritis and osteoarthritis.  Patient remains on Humira 40 mg sq injections every 14 days and plaquenil 200 mg 1 tablet by mouth twice daily.  Patient states he is been having a flare for the past 1 and half weeks.  She states that the discomfort started in her lower back but she is now having increased discomfort in both knee joints.  She denies any joint swelling at this time.  She has had some increased difficulty ambulating.  She denies any recent injury or fall.  Patient has been under increased rest recently that she feels is contributing to her current flare.  She denies any recent or recurrent infections.  She states that after taking her Humira injection she notices a rash and itching on her torso.  She states that as she gets closer to being due for her next Humira injection the rash and itching subside.  Patient is not yet ready to make any medication changes and feels that the current regimen is still keeping her rheumatoid arthritis stable.     Activities of Daily Living:  Patient reports morning stiffness for all day. Patient Reports nocturnal pain.  Difficulty dressing/grooming: Denies Difficulty climbing stairs: Reports Difficulty getting out of chair: Reports Difficulty using hands for taps, buttons, cutlery, and/or writing: Reports  Review of Systems  Constitutional:  Positive for fatigue.  HENT:  Negative for mouth sores and mouth dryness.   Eyes:  Negative for dryness.  Respiratory:  Negative for shortness of breath.   Cardiovascular:  Negative for chest pain and palpitations.  Gastrointestinal:   Negative for blood in stool, constipation and diarrhea.  Endocrine: Negative for increased urination.  Genitourinary:  Negative for involuntary urination.  Musculoskeletal:  Positive for joint pain, gait problem, joint pain, joint swelling, myalgias, muscle weakness, morning stiffness, muscle tenderness and myalgias.  Skin:  Positive for rash. Negative for color change, hair loss and sensitivity to sunlight.  Allergic/Immunologic: Negative for susceptible to infections.  Neurological:  Positive for headaches. Negative for dizziness.  Hematological:  Negative for swollen glands.  Psychiatric/Behavioral:  Positive for depressed mood and sleep disturbance. The patient is nervous/anxious.     PMFS History:  Patient Active Problem List   Diagnosis Date Noted   Rheumatoid arthritis involving multiple sites with positive rheumatoid factor (HCC) 04/21/2020   High risk medication use 04/21/2020   Primary osteoarthritis of both hands 04/21/2020   Primary osteoarthritis of both knees 04/21/2020   Primary osteoarthritis of both feet 04/21/2020   DDD (degenerative disc disease), lumbar 04/21/2020   Fracture, Colles, right, closed 04/09/2018   Closed fracture of right distal radius and ulna, initial encounter 03/27/2018   Lumbar radiculopathy 09/19/2016   RENAL CALCULUS 05/04/2008   FLANK PAIN, RIGHT 05/04/2008    Past Medical History:  Diagnosis Date   Anxiety    Chronic back pain    Chronic back pain    GERD (gastroesophageal reflux disease)    History of cardiac murmur  07-26-2020  per pt happen after last pregnancy, work-up done was ok, and was told few years pcp no longer hears it   History of kidney stones    Kidney stones    OA (osteoarthritis)    hands , knees, feet   RA (rheumatoid arthritis) (HCC)    rheumotology--- Dr Kathie Rhodes. Deveshwar--- multiple sites seropositive/ positive rheumotoid factor,  taking plaquinel   Right ureteral calculus    Wears glasses     Family History   Problem Relation Age of Onset   Fibromyalgia Mother    Heart disease Mother    Macular degeneration Mother    Rheum arthritis Father    Rheum arthritis Sister    Cancer Brother    Breast cancer Neg Hx    Past Surgical History:  Procedure Laterality Date   COLONOSCOPY  2012   CYSTO/  RIGHT URETERAL STENT PLACEMENT  05-10-2008   CYSTOSCOPY W/ URETERAL STENT PLACEMENT Right 06/09/2014   Procedure: CYSTOSCOPY WITH RETROGRADE PYELOGRAM/URETERAL STENT PLACEMENT;  Surgeon: Valetta Fuller, MD;  Location: [facility];  Service: Urology;  Laterality: Right;   CYSTOSCOPY W/ URETERAL STENT PLACEMENT Left 02/17/2020   Procedure: CYSTOSCOPY WITH RETROGRADE PYELOGRAM/URETERAL STENT PLACEMENT;  Surgeon: Sebastian Ache, MD;  Location: [facility];  Service: Urology;  Laterality: Left;  45 MINS   CYSTOSCOPY/URETEROSCOPY/HOLMIUM LASER/STENT PLACEMENT Left 02/22/2020   Procedure: CYSTOSCOPY/LEFT URETEROSCOPY/HOLMIUM LASER/STENT EXCHANGE;  Surgeon: [name], MD;  Location: [facility];  Service: Urology;  Laterality: Left;   CYSTOSCOPY/URETEROSCOPY/HOLMIUM LASER/STENT PLACEMENT Right 07/28/2020   Procedure: CYSTOSCOPY RIGHT URETEROSCOPY RIGHT RETROGRADE PYELOGRAM HOLMIUM LASER/STENT PLACEMENT;  Surgeon: [name], MD;  Location: [facility];  Service: Urology;  Laterality: Right;   EXTRACORPOREAL SHOCK WAVE LITHOTRIPSY Right 05-10-2008   KNEE ARTHROSCOPY Left 2009   OPEN REDUCTION INTERNAL FIXATION (ORIF) DISTAL RADIAL FRACTURE Right 03/27/2018   Procedure: OPEN REDUCTION INTERNAL FIXATION (ORIF) RIGHT DISTAL RADIUS FRACTURE;  Surgeon: Tarry Kos, MD;  [facility];  Service: Orthopedics;  Laterality: Right;   WISDOM TOOTH EXTRACTION  AGE 12   Social History   Social History Narrative   Not on file    There is no immunization history on file for this patient.   Objective: Vital Signs: BP (!) 185/99 (BP Location: Left Arm, Patient Position: Sitting, Cuff  Size: Normal)   Pulse 69   Resp 16   Ht 5\' 6"  (1.676 m)   Wt 196 lb 3.2 oz (89 kg)   BMI 31.67 kg/m    Physical Exam Vitals and nursing note reviewed.  Constitutional:      Appearance: She is well-developed.  HENT:     Head: Normocephalic and atraumatic.  Eyes:     Conjunctiva/sclera: Conjunctivae normal.  Cardiovascular:     Rate and Rhythm: Normal rate and regular rhythm.     Heart sounds: Normal heart sounds.  Pulmonary:     Effort: Pulmonary effort is normal.     Breath sounds: Normal breath sounds.  Abdominal:     General: Bowel sounds are normal.     Palpations: Abdomen is soft.  Musculoskeletal:     Cervical back: Normal range of motion.  Lymphadenopathy:     Cervical: No cervical adenopathy.  Skin:    General: Skin is warm and dry.     Capillary Refill: Capillary refill takes less than 2 seconds.  Neurological:     Mental Status: She is alert and oriented to person, place, and time.  Psychiatric:  Behavior: Behavior normal.      Musculoskeletal Exam: C-spine has good ROM.  Limited mobility of lumbar spine with discomfort.  Shoulder joints, elbow joints, wrist joints, MCPs, PIPs, and DIPs good ROM with no synovitis.  Tenderness of the right 3rd PIP joint. Complete fist formation bilaterally.  Hip joints have good ROM with no groin pain.  Knee joints have good ROM with discomfort bilaterally.  No warmth or effusion of knee joints.  No warmth or swelling of ankle joints.    CDAI Exam: CDAI Score: -- Patient Global: --; Provider Global: -- Swollen: --; Tender: -- Joint Exam 05/27/2023   No joint exam has been documented for this visit   There is currently no information documented on the homunculus. Go to the Rheumatology activity and complete the homunculus joint exam.  Investigation: No additional findings.  Imaging: No results found.  Recent Labs: Lab Results  Component Value Date   WBC 5.3 02/28/2023   HGB 14.6 02/28/2023   PLT 287  02/28/2023   NA 142 02/28/2023   K 4.3 02/28/2023   CL 106 02/28/2023   CO2 29 02/28/2023   GLUCOSE 85 02/28/2023   BUN 13 02/28/2023   CREATININE 0.76 02/28/2023   BILITOT 0.7 02/28/2023   AST 25 02/28/2023   ALT 17 02/28/2023   PROT 6.8 02/28/2023   CALCIUM 9.4 02/28/2023   GFRAA 87 03/15/2021   QFTBGOLDPLUS NEGATIVE 02/28/2023    Speciality Comments: PLQ eye exam: 07/12/2022 WNL. [facility] f/u 1 year. Arava- no reponse dcd after 4 months Enbrel 04/02/22 Patient does not want to take methotrexate as she believes it caused toxicity to her father.  Procedures:  No procedures performed Allergies: Sulfa antibiotics   Assessment / Plan:     Visit Diagnoses: Rheumatoid arthritis involving multiple sites with positive rheumatoid factor (HCC): No synovitis was noted on examination today.  Patient presents today with increased pain in both knee joints especially her left knee.  She has painful range of motion of both knees on examination today as well as discomfort with ambulation.  She is been experiencing increased stiffness and pain in both knees for the past 1 week.  No recent injury or fall.  On examination no warmth or effusion was noted.  She remains on Humira 40 mg sq injections every 14 days along with Plaquenil 200 mg 1 tablet by mouth twice daily.  She is tolerating combination therapy without any side effects and has not missed any doses recently.  She is due for her Humira injection today.  Patient has been under tremendous amount of stress recently which is likely contributing to her recent flare.  Discussed possibly switching Humira to Actemra in the future if she continues to have recurrent flares.  Patient continues to experience itching on her torso after each Humira dose.  She does not want to make any medication changes at this time.  A prednisone taper starting 20 mg tapering by 5 mg every 4 days was sent to the pharmacy.  Instructions were provided including holding  Celebrex as well as taking prednisone first thing in the morning with food.  She will notify us if her symptoms persist or worsen.  We can discuss treatment options at her follow-up visit in 2 months or sooner if needed.  High risk medication use - Humira 40 mg sq injections every 14 days and plaquenil 200 mg 1 tablet by mouth twice daily.TB gold negative on 03/14/22. PLQ eye exam: 07/12/2022 WNL. [facility] f/u 1 year.  CBC and CMP WNL on 02/28/23. Orders for CBC and CMP released today.   TB gold negative on 02/28/23.  No recent or recurrent infections.  Discussed the importance of holding Humira if she develops signs or symptoms of an infection and to resume once the infection has completely cleared.  Plan: CBC with Differential/Platelet, COMPLETE METABOLIC PANEL WITH GFR  Pseudogout: No signs or symptoms of a flare.   Primary osteoarthritis of both hands: PIP and DIP thickening consistent with osteoarthritis of both hands noted.   Effusion, right knee: No warmth or effusion noted tadalafil  Primary osteoarthritis of both knees: Chronic pain.  Patient presents today with increased pain in both knee joints especially the left knee.  She has been having increased discomfort in both knees for the past 1 week.  On examination no warmth or effusion noted.  She has discomfort with range of motion bilaterally.  She has been taking Celebrex 200 mg 1 capsule by mouth daily for pain relief but continues to have difficulty ambulating.  A prednisone taper starting 20 mg tapering by 5 mg every 4 days was sent to the pharmacy.  She was advised to hold Celebrex while taking prednisone.  She will notify us if her symptoms persist or worsen.   Primary osteoarthritis of both feet: She is not experiencing any increased discomfort in her feet at this time.  DDD (degenerative disc disease), lumbar: Under care of Dr. Alvester Morin.  She has been experiencing increased pain in her lower back for the past 1.5 weeks.  No  recent fall or injury.  She is experiencing right-sided sciatica.  A prednisone taper sent to the pharmacy as discussed above.  She plans on following up with Dr. Alvester Morin if her symptoms persist or worsen.  Other fatigue: Stable.   Other medical conditions are listed as follows:   Closed Colles' fracture of right radius with routine healing, subsequent encounter  RENAL CALCULUS  Essential hypertension: Blood pressure was elevated today in the office-rechecked prior to leaving.  Patient has been taking losartan as prescribed.  She has been checking her blood pressure at home.  Patient was advised to reach out to her PCP if her blood pressure remains elevated.  She has been under tremendous amount of stress which is likely contributing to the increase in BP.   Family history of rheumatoid arthritis  Orders: Orders Placed This Encounter  Procedures   CBC with Differential/Platelet   COMPLETE METABOLIC PANEL WITH GFR   Meds ordered this encounter  Medications   predniSONE (DELTASONE) 5 MG tablet    Sig: Take 4 tablets by mouth daily x4 days, 3 tablets daily x4 days, 2 tablets daily x4 days, 1 tablet daily x4 days.    Dispense:  40 tablet    Refill:  0   Follow-Up Instructions: Return in about 2 months (around 07/27/2023) for Rheumatoid arthritis, Osteoarthritis.   Gearldine Bienenstock, PA-C  Note - This record has been created using Dragon software.  Chart creation errors have been sought, but may not always  have been located. Such creation errors do not reflect on  the standard of medical care.

## 2023-05-17 ENCOUNTER — Other Ambulatory Visit: Payer: Self-pay

## 2023-05-24 ENCOUNTER — Telehealth: Payer: Self-pay

## 2023-05-24 DIAGNOSIS — M5416 Radiculopathy, lumbar region: Secondary | ICD-10-CM

## 2023-05-24 NOTE — Telephone Encounter (Signed)
Spoke with patient and she is requesting another injection. Last injection was 07/2022 and it lasted up until 3 days ago with 100% relief during that time. No new falls, accidents or injuries. Please advise

## 2023-05-24 NOTE — Addendum Note (Signed)
Addended by: Sharlet Salina on: 05/24/2023 10:10 AM   Modules accepted: Orders

## 2023-05-24 NOTE — Telephone Encounter (Signed)
Order for repeat last L5 tf esi placed

## 2023-05-27 ENCOUNTER — Encounter: Payer: Self-pay | Admitting: Physician Assistant

## 2023-05-27 ENCOUNTER — Ambulatory Visit: Payer: Commercial Managed Care - HMO | Attending: Physician Assistant | Admitting: Physician Assistant

## 2023-05-27 VITALS — BP 185/99 | HR 69 | Resp 16 | Ht 66.0 in | Wt 196.2 lb

## 2023-05-27 DIAGNOSIS — I10 Essential (primary) hypertension: Secondary | ICD-10-CM

## 2023-05-27 DIAGNOSIS — M19041 Primary osteoarthritis, right hand: Secondary | ICD-10-CM

## 2023-05-27 DIAGNOSIS — M19071 Primary osteoarthritis, right ankle and foot: Secondary | ICD-10-CM

## 2023-05-27 DIAGNOSIS — M170 Bilateral primary osteoarthritis of knee: Secondary | ICD-10-CM

## 2023-05-27 DIAGNOSIS — N200 Calculus of kidney: Secondary | ICD-10-CM

## 2023-05-27 DIAGNOSIS — Z79899 Other long term (current) drug therapy: Secondary | ICD-10-CM

## 2023-05-27 DIAGNOSIS — M0579 Rheumatoid arthritis with rheumatoid factor of multiple sites without organ or systems involvement: Secondary | ICD-10-CM | POA: Diagnosis not present

## 2023-05-27 DIAGNOSIS — Z8261 Family history of arthritis: Secondary | ICD-10-CM

## 2023-05-27 DIAGNOSIS — R5383 Other fatigue: Secondary | ICD-10-CM

## 2023-05-27 DIAGNOSIS — M19042 Primary osteoarthritis, left hand: Secondary | ICD-10-CM

## 2023-05-27 DIAGNOSIS — M19072 Primary osteoarthritis, left ankle and foot: Secondary | ICD-10-CM

## 2023-05-27 DIAGNOSIS — M25461 Effusion, right knee: Secondary | ICD-10-CM

## 2023-05-27 DIAGNOSIS — M5136 Other intervertebral disc degeneration, lumbar region: Secondary | ICD-10-CM

## 2023-05-27 DIAGNOSIS — S52531D Colles' fracture of right radius, subsequent encounter for closed fracture with routine healing: Secondary | ICD-10-CM

## 2023-05-27 DIAGNOSIS — M1120 Other chondrocalcinosis, unspecified site: Secondary | ICD-10-CM | POA: Diagnosis not present

## 2023-05-27 MED ORDER — PREDNISONE 5 MG PO TABS: ORAL_TABLET | ORAL | 0 refills | Status: AC

## 2023-05-28 LAB — CBC WITH DIFFERENTIAL/PLATELET
Absolute Monocytes: 636 {cells}/uL (ref 200–950)
Basophils Absolute: 72 {cells}/uL (ref 0–200)
Basophils Relative: 1.2 %
Eosinophils Absolute: 282 {cells}/uL (ref 15–500)
Eosinophils Relative: 4.7 %
HCT: 44.1 % (ref 35.0–45.0)
Hemoglobin: 15.4 g/dL (ref 11.7–15.5)
Lymphs Abs: 2070 {cells}/uL (ref 850–3900)
MCH: 32.2 pg (ref 27.0–33.0)
MCHC: 34.9 g/dL (ref 32.0–36.0)
MCV: 92.1 fL (ref 80.0–100.0)
MPV: 10.6 fL (ref 7.5–12.5)
Monocytes Relative: 10.6 %
Neutro Abs: 2940 {cells}/uL (ref 1500–7800)
Neutrophils Relative %: 49 %
Platelets: 309 10*3/uL (ref 140–400)
RBC: 4.79 10*6/uL (ref 3.80–5.10)
RDW: 11.5 % (ref 11.0–15.0)
Total Lymphocyte: 34.5 %
WBC: 6 10*3/uL (ref 3.8–10.8)

## 2023-05-28 LAB — COMPLETE METABOLIC PANEL WITH GFR
AG Ratio: 1.7 (calc) (ref 1.0–2.5)
ALT: 17 U/L (ref 6–29)
AST: 23 U/L (ref 10–35)
Albumin: 4.4 g/dL (ref 3.6–5.1)
Alkaline phosphatase (APISO): 95 U/L (ref 37–153)
BUN: 17 mg/dL (ref 7–25)
CO2: 30 mmol/L (ref 20–32)
Calcium: 9.9 mg/dL (ref 8.6–10.4)
Chloride: 104 mmol/L (ref 98–110)
Creat: 0.75 mg/dL (ref 0.50–1.05)
Globulin: 2.6 g/dL (ref 1.9–3.7)
Glucose, Bld: 84 mg/dL (ref 65–99)
Potassium: 4.2 mmol/L (ref 3.5–5.3)
Sodium: 141 mmol/L (ref 135–146)
Total Bilirubin: 0.7 mg/dL (ref 0.2–1.2)
Total Protein: 7 g/dL (ref 6.1–8.1)
eGFR: 90 mL/min/{1.73_m2} (ref >=60)

## 2023-05-28 NOTE — Progress Notes (Signed)
CBC and CMP WNL

## 2023-06-10 ENCOUNTER — Ambulatory Visit: Payer: Commercial Managed Care - HMO | Admitting: Physical Medicine and Rehabilitation

## 2023-06-10 ENCOUNTER — Other Ambulatory Visit: Payer: Self-pay

## 2023-06-10 VITALS — BP 162/90 | HR 109

## 2023-06-10 DIAGNOSIS — M5116 Intervertebral disc disorders with radiculopathy, lumbar region: Secondary | ICD-10-CM

## 2023-06-10 DIAGNOSIS — M545 Low back pain, unspecified: Secondary | ICD-10-CM | POA: Diagnosis not present

## 2023-06-10 DIAGNOSIS — M0579 Rheumatoid arthritis with rheumatoid factor of multiple sites without organ or systems involvement: Secondary | ICD-10-CM

## 2023-06-10 DIAGNOSIS — M47816 Spondylosis without myelopathy or radiculopathy, lumbar region: Secondary | ICD-10-CM

## 2023-06-10 DIAGNOSIS — M5416 Radiculopathy, lumbar region: Secondary | ICD-10-CM

## 2023-06-10 DIAGNOSIS — G8929 Other chronic pain: Secondary | ICD-10-CM

## 2023-06-10 MED ORDER — METHYLPREDNISOLONE ACETATE 80 MG/ML IJ SUSP
80.0000 mg | Freq: Once | INTRAMUSCULAR | Status: AC
  Administered 2023-06-10: 80 mg

## 2023-06-10 NOTE — Progress Notes (Unsigned)
Functional Pain Scale - descriptive words and definitions  Distracting (5)    Aware of pain/able to complete some ADL's but limited by pain/sleep is affected and active distractions are only slightly useful. Moderate range order  Average Pain 5   +Driver, -BT, -Dye Allergies.  Lower back pain on left side with some radiation in the left leg

## 2023-06-10 NOTE — Patient Instructions (Signed)

## 2023-06-11 ENCOUNTER — Encounter: Payer: Self-pay | Admitting: Physical Medicine and Rehabilitation

## 2023-06-11 DIAGNOSIS — F419 Anxiety disorder, unspecified: Secondary | ICD-10-CM | POA: Insufficient documentation

## 2023-06-11 DIAGNOSIS — F32A Depression, unspecified: Secondary | ICD-10-CM | POA: Insufficient documentation

## 2023-06-11 DIAGNOSIS — R1310 Dysphagia, unspecified: Secondary | ICD-10-CM | POA: Insufficient documentation

## 2023-06-11 DIAGNOSIS — I10 Essential (primary) hypertension: Secondary | ICD-10-CM | POA: Insufficient documentation

## 2023-06-11 NOTE — Progress Notes (Signed)
Courtney Kramer - 62 y.o. female MRN [id number]  Date of birth: 04-06-1961  Office Visit Note: Visit Date: 06/10/2023 PCP: Erskine Emery, NP Referred by: Erskine Emery, NP  Subjective: Chief Complaint  Patient presents with   Lower Back - Pain   HPI: Courtney Kramer is a 62 y.o. female who comes in today for evaluation and management of chronic and worsening severe low back pain that historically had been right radicular pain secondary to foraminal narrowing and disc herniation.  Over the years we have completed right L5 transforaminal injection with really good relief of her symptoms.  She saw myself and Courtney Goodie, Kramer back in November with similar complaints although more back and hip pain than true down the leg radicular pain.  We elected at that time to repeat the L5 transforaminal injection and she reports today that that did seem to help quite a bit.  Her pain today is different gets a little bit more the left side it is lower back worse with standing and going from sit to stand.  Some referral into the hip area but not really down the leg per se.  No real paresthesia no focal weakness no new injury.  Significant pain however.  She rates her pain on the functional pain scale as of 5 out of 10 which is able to complete some ADLs but is really affecting her as moderate in range.  Her case is complicated by history of anxiety as well as rheumatoid arthritis with high risk medication.  MRI from 2019 showed some resolution of disc herniation but still foraminal narrowing on the right with facet arthropathy throughout.  We had mentioned to her in the past that we may need updated MRI depending on symptomatic relief and what is going on.   I spent more than 25 minutes speaking face-to-face with the patient with 50% of the time in counseling and discussing coordination of care.       Review of Systems  Musculoskeletal:  Positive for back pain and joint pain.  All other systems  reviewed and are negative.  Otherwise per HPI.  Assessment & Plan: Visit Diagnoses:    ICD-10-CM   1. Lumbar radiculopathy  M54.16 XR C-ARM NO REPORT    Epidural Steroid injection    methylPREDNISolone acetate (DEPO-MEDROL) injection 80 mg    2. Spondylosis without myelopathy or radiculopathy, lumbar region  M47.816     3. Radiculopathy due to lumbar intervertebral disc disorder  M51.16     4. Chronic left-sided low back pain without sciatica  M54.50    G89.29     5. Rheumatoid arthritis involving multiple sites with positive rheumatoid factor (HCC)  M05.79        Plan: Findings:  Chronic history of back and hip and leg pain for secondary to disc herniation which is has seemingly resolved to a good degree on imaging from 2019 but still has facet arthropathy and some foraminal narrowing.  She now has more left-sided centralized low back pain some referral but not really down the leg per se.  Because she has done so well in the past with epidural injection at that level we went ahead and completed the right L5 transforaminal injection today diagnostically and hopefully therapeutically.  Depending on that relief however I think it is time to update the MRI from 2019.  She reports today that we had updated this already that she had that done last year at the hospital but this  Courtney Kramer - 62 y.o. female MRN [id number]  Date of birth: 04-06-1961  Office Visit Note: Visit Date: 06/10/2023 PCP: Erskine Emery, NP Referred by: Erskine Emery, NP  Subjective: Chief Complaint  Patient presents with   Lower Back - Pain   HPI: Courtney Kramer is a 62 y.o. female who comes in today for evaluation and management of chronic and worsening severe low back pain that historically had been right radicular pain secondary to foraminal narrowing and disc herniation.  Over the years we have completed right L5 transforaminal injection with really good relief of her symptoms.  She saw myself and Courtney Goodie, Kramer back in November with similar complaints although more back and hip pain than true down the leg radicular pain.  We elected at that time to repeat the L5 transforaminal injection and she reports today that that did seem to help quite a bit.  Her pain today is different gets a little bit more the left side it is lower back worse with standing and going from sit to stand.  Some referral into the hip area but not really down the leg per se.  No real paresthesia no focal weakness no new injury.  Significant pain however.  She rates her pain on the functional pain scale as of 5 out of 10 which is able to complete some ADLs but is really affecting her as moderate in range.  Her case is complicated by history of anxiety as well as rheumatoid arthritis with high risk medication.  MRI from 2019 showed some resolution of disc herniation but still foraminal narrowing on the right with facet arthropathy throughout.  We had mentioned to her in the past that we may need updated MRI depending on symptomatic relief and what is going on.   I spent more than 25 minutes speaking face-to-face with the patient with 50% of the time in counseling and discussing coordination of care.       Review of Systems  Musculoskeletal:  Positive for back pain and joint pain.  All other systems  reviewed and are negative.  Otherwise per HPI.  Assessment & Plan: Visit Diagnoses:    ICD-10-CM   1. Lumbar radiculopathy  M54.16 XR C-ARM NO REPORT    Epidural Steroid injection    methylPREDNISolone acetate (DEPO-MEDROL) injection 80 mg    2. Spondylosis without myelopathy or radiculopathy, lumbar region  M47.816     3. Radiculopathy due to lumbar intervertebral disc disorder  M51.16     4. Chronic left-sided low back pain without sciatica  M54.50    G89.29     5. Rheumatoid arthritis involving multiple sites with positive rheumatoid factor (HCC)  M05.79        Plan: Findings:  Chronic history of back and hip and leg pain for secondary to disc herniation which is has seemingly resolved to a good degree on imaging from 2019 but still has facet arthropathy and some foraminal narrowing.  She now has more left-sided centralized low back pain some referral but not really down the leg per se.  Because she has done so well in the past with epidural injection at that level we went ahead and completed the right L5 transforaminal injection today diagnostically and hopefully therapeutically.  Depending on that relief however I think it is time to update the MRI from 2019.  She reports today that we had updated this already that she had that done last year at the hospital but this  Courtney Kramer - 62 y.o. female MRN [id number]  Date of birth: 04-06-1961  Office Visit Note: Visit Date: 06/10/2023 PCP: Erskine Emery, NP Referred by: Erskine Emery, NP  Subjective: Chief Complaint  Patient presents with   Lower Back - Pain   HPI: Courtney Kramer is a 62 y.o. female who comes in today for evaluation and management of chronic and worsening severe low back pain that historically had been right radicular pain secondary to foraminal narrowing and disc herniation.  Over the years we have completed right L5 transforaminal injection with really good relief of her symptoms.  She saw myself and Courtney Goodie, Kramer back in November with similar complaints although more back and hip pain than true down the leg radicular pain.  We elected at that time to repeat the L5 transforaminal injection and she reports today that that did seem to help quite a bit.  Her pain today is different gets a little bit more the left side it is lower back worse with standing and going from sit to stand.  Some referral into the hip area but not really down the leg per se.  No real paresthesia no focal weakness no new injury.  Significant pain however.  She rates her pain on the functional pain scale as of 5 out of 10 which is able to complete some ADLs but is really affecting her as moderate in range.  Her case is complicated by history of anxiety as well as rheumatoid arthritis with high risk medication.  MRI from 2019 showed some resolution of disc herniation but still foraminal narrowing on the right with facet arthropathy throughout.  We had mentioned to her in the past that we may need updated MRI depending on symptomatic relief and what is going on.   I spent more than 25 minutes speaking face-to-face with the patient with 50% of the time in counseling and discussing coordination of care.       Review of Systems  Musculoskeletal:  Positive for back pain and joint pain.  All other systems  reviewed and are negative.  Otherwise per HPI.  Assessment & Plan: Visit Diagnoses:    ICD-10-CM   1. Lumbar radiculopathy  M54.16 XR C-ARM NO REPORT    Epidural Steroid injection    methylPREDNISolone acetate (DEPO-MEDROL) injection 80 mg    2. Spondylosis without myelopathy or radiculopathy, lumbar region  M47.816     3. Radiculopathy due to lumbar intervertebral disc disorder  M51.16     4. Chronic left-sided low back pain without sciatica  M54.50    G89.29     5. Rheumatoid arthritis involving multiple sites with positive rheumatoid factor (HCC)  M05.79        Plan: Findings:  Chronic history of back and hip and leg pain for secondary to disc herniation which is has seemingly resolved to a good degree on imaging from 2019 but still has facet arthropathy and some foraminal narrowing.  She now has more left-sided centralized low back pain some referral but not really down the leg per se.  Because she has done so well in the past with epidural injection at that level we went ahead and completed the right L5 transforaminal injection today diagnostically and hopefully therapeutically.  Depending on that relief however I think it is time to update the MRI from 2019.  She reports today that we had updated this already that she had that done last year at the hospital but this  Courtney Kramer - 62 y.o. female MRN [id number]  Date of birth: 04-06-1961  Office Visit Note: Visit Date: 06/10/2023 PCP: Erskine Emery, NP Referred by: Erskine Emery, NP  Subjective: Chief Complaint  Patient presents with   Lower Back - Pain   HPI: Courtney Kramer is a 62 y.o. female who comes in today for evaluation and management of chronic and worsening severe low back pain that historically had been right radicular pain secondary to foraminal narrowing and disc herniation.  Over the years we have completed right L5 transforaminal injection with really good relief of her symptoms.  She saw myself and Courtney Goodie, Kramer back in November with similar complaints although more back and hip pain than true down the leg radicular pain.  We elected at that time to repeat the L5 transforaminal injection and she reports today that that did seem to help quite a bit.  Her pain today is different gets a little bit more the left side it is lower back worse with standing and going from sit to stand.  Some referral into the hip area but not really down the leg per se.  No real paresthesia no focal weakness no new injury.  Significant pain however.  She rates her pain on the functional pain scale as of 5 out of 10 which is able to complete some ADLs but is really affecting her as moderate in range.  Her case is complicated by history of anxiety as well as rheumatoid arthritis with high risk medication.  MRI from 2019 showed some resolution of disc herniation but still foraminal narrowing on the right with facet arthropathy throughout.  We had mentioned to her in the past that we may need updated MRI depending on symptomatic relief and what is going on.   I spent more than 25 minutes speaking face-to-face with the patient with 50% of the time in counseling and discussing coordination of care.       Review of Systems  Musculoskeletal:  Positive for back pain and joint pain.  All other systems  reviewed and are negative.  Otherwise per HPI.  Assessment & Plan: Visit Diagnoses:    ICD-10-CM   1. Lumbar radiculopathy  M54.16 XR C-ARM NO REPORT    Epidural Steroid injection    methylPREDNISolone acetate (DEPO-MEDROL) injection 80 mg    2. Spondylosis without myelopathy or radiculopathy, lumbar region  M47.816     3. Radiculopathy due to lumbar intervertebral disc disorder  M51.16     4. Chronic left-sided low back pain without sciatica  M54.50    G89.29     5. Rheumatoid arthritis involving multiple sites with positive rheumatoid factor (HCC)  M05.79        Plan: Findings:  Chronic history of back and hip and leg pain for secondary to disc herniation which is has seemingly resolved to a good degree on imaging from 2019 but still has facet arthropathy and some foraminal narrowing.  She now has more left-sided centralized low back pain some referral but not really down the leg per se.  Because she has done so well in the past with epidural injection at that level we went ahead and completed the right L5 transforaminal injection today diagnostically and hopefully therapeutically.  Depending on that relief however I think it is time to update the MRI from 2019.  She reports today that we had updated this already that she had that done last year at the hospital but this  is simply not the case.  We do not have any orders put in for the MRI of her lumbar spine and do not see any new images in the system.  It appears that she had an MRI of her knee through Dr. Roda Shutters last year and that may be what she is thinking of.  Nonetheless depending on relief we will get an MRI of her lumbar spine.  Differential diagnosis I think is lumbar facet pain at this point versus myofascial pain.  Consider regrouping with physical therapy or chiropractic care.    Meds & Orders:  Meds ordered this encounter  Medications   methylPREDNISolone acetate (DEPO-MEDROL) injection 80 mg    Orders Placed This Encounter   Procedures   XR C-ARM NO REPORT   Epidural Steroid injection    Follow-up: Return for visit to requesting provider as needed.   Procedures: No procedures performed  Lumbosacral Transforaminal Epidural Steroid Injection - Sub-Pedicular Approach with Fluoroscopic Guidance  Patient: Courtney Kramer      Date of Birth: 02/26/1961 MRN: [id number] PCP: Erskine Emery, NP      Visit Date: 06/10/2023   Universal Protocol:    Date/Time: 06/10/2023  Consent Given By: the patient  Position: PRONE  Additional Comments: Vital signs were monitored before and after the procedure. Patient was prepped and draped in the usual sterile fashion. The correct patient, procedure, and site was verified.   Injection Procedure Details:   Procedure diagnoses: Lumbar radiculopathy [M54.16]    Meds Administered:  Meds ordered this encounter  Medications   methylPREDNISolone acetate (DEPO-MEDROL) injection 80 mg    Laterality: Right  Location/Site: L5  Needle:5.0 in., 22 ga.  Short bevel or Quincke spinal needle  Needle Placement: Transforaminal  Findings:    -Comments: Excellent flow of contrast along the nerve, nerve root and into the epidural space.  Procedure Details: After squaring off the end-plates to get a true AP view, the C-arm was positioned so that an oblique view of the foramen as noted above was visualized. The target area is just inferior to the "nose of the scotty dog" or sub pedicular. The soft tissues overlying this structure were infiltrated with 2-3 ml. of 1% Lidocaine without Epinephrine.  The spinal needle was inserted toward the target using a "trajectory" view along the fluoroscope beam.  Under AP and lateral visualization, the needle was advanced so it did not puncture dura and was located close the 6 O'Clock position of the pedical in AP tracterory. Biplanar projections were used to confirm position. Aspiration was confirmed to be negative for CSF and/or blood. A 1-2  ml. volume of Isovue-250 was injected and flow of contrast was noted at each level. Radiographs were obtained for documentation purposes.   After attaining the desired flow of contrast documented above, a 0.5 to 1.0 ml test dose of 0.25% Marcaine was injected into each respective transforaminal space.  The patient was observed for 90 seconds post injection.  After no sensory deficits were reported, and normal lower extremity motor function was noted,   the above injectate was administered so that equal amounts of the injectate were placed at each foramen (level) into the transforaminal epidural space.   Additional Comments:  No complications occurred Dressing: 2 x 2 sterile gauze and Band-Aid    Post-procedure details: Patient was observed during the procedure. Post-procedure instructions were reviewed.  Patient left the clinic in stable condition.    Clinical History: Narrative & Impression CLINICAL DATA:  Low back

## 2023-06-11 NOTE — Procedures (Signed)
Lumbosacral Transforaminal Epidural Steroid Injection - Sub-Pedicular Approach with Fluoroscopic Guidance  Patient: Courtney Kramer      Date of Birth: 03-08-1961 MRN: [id number] PCP: Erskine Emery, NP      Visit Date: 06/10/2023   Universal Protocol:    Date/Time: 06/10/2023  Consent Given By: the patient  Position: PRONE  Additional Comments: Vital signs were monitored before and after the procedure. Patient was prepped and draped in the usual sterile fashion. The correct patient, procedure, and site was verified.   Injection Procedure Details:   Procedure diagnoses: Lumbar radiculopathy [M54.16]    Meds Administered:  Meds ordered this encounter  Medications   methylPREDNISolone acetate (DEPO-MEDROL) injection 80 mg    Laterality: Right  Location/Site: L5  Needle:5.0 in., 22 ga.  Short bevel or Quincke spinal needle  Needle Placement: Transforaminal  Findings:    -Comments: Excellent flow of contrast along the nerve, nerve root and into the epidural space.  Procedure Details: After squaring off the end-plates to get a true AP view, the C-arm was positioned so that an oblique view of the foramen as noted above was visualized. The target area is just inferior to the "nose of the scotty dog" or sub pedicular. The soft tissues overlying this structure were infiltrated with 2-3 ml. of 1% Lidocaine without Epinephrine.  The spinal needle was inserted toward the target using a "trajectory" view along the fluoroscope beam.  Under AP and lateral visualization, the needle was advanced so it did not puncture dura and was located close the 6 O'Clock position of the pedical in AP tracterory. Biplanar projections were used to confirm position. Aspiration was confirmed to be negative for CSF and/or blood. A 1-2 ml. volume of Isovue-250 was injected and flow of contrast was noted at each level. Radiographs were obtained for documentation purposes.   After attaining the desired flow  of contrast documented above, a 0.5 to 1.0 ml test dose of 0.25% Marcaine was injected into each respective transforaminal space.  The patient was observed for 90 seconds post injection.  After no sensory deficits were reported, and normal lower extremity motor function was noted,   the above injectate was administered so that equal amounts of the injectate were placed at each foramen (level) into the transforaminal epidural space.   Additional Comments:  No complications occurred Dressing: 2 x 2 sterile gauze and Band-Aid    Post-procedure details: Patient was observed during the procedure. Post-procedure instructions were reviewed.  Patient left the clinic in stable condition.

## 2023-07-03 ENCOUNTER — Other Ambulatory Visit: Payer: Self-pay

## 2023-07-05 ENCOUNTER — Other Ambulatory Visit: Payer: Self-pay

## 2023-07-08 ENCOUNTER — Other Ambulatory Visit: Payer: Self-pay

## 2023-07-08 NOTE — Progress Notes (Signed)
Reached patient for Humira refill and clinical assessment. Pt states that Humira is not working well for her and she would like to wait until her appointment with Dr. Amada Jupiter on 11/12 to discuss a therapy change Will pend follow-up call after appointment. Pt is aware if she needs anything before then to give pharmacy a call.

## 2023-07-15 ENCOUNTER — Telehealth: Payer: Self-pay | Admitting: Physical Medicine and Rehabilitation

## 2023-07-15 ENCOUNTER — Other Ambulatory Visit: Payer: Self-pay | Admitting: Physical Medicine and Rehabilitation

## 2023-07-15 DIAGNOSIS — M5416 Radiculopathy, lumbar region: Secondary | ICD-10-CM

## 2023-07-15 NOTE — Telephone Encounter (Signed)
Patient called and wanted to set up an appointment for another shot in her back. She already had an MRI back in June. CB#[phone]

## 2023-07-15 NOTE — Telephone Encounter (Signed)
Spoke with patient and she stated the last injection from September lasted a couple weeks. She stated she remembers Dr. Alvester Morin saying she may need another MRI. She would like it done at [facility]

## 2023-07-16 ENCOUNTER — Other Ambulatory Visit: Payer: Self-pay | Admitting: Physician Assistant

## 2023-07-16 DIAGNOSIS — M0579 Rheumatoid arthritis with rheumatoid factor of multiple sites without organ or systems involvement: Secondary | ICD-10-CM

## 2023-07-16 NOTE — Progress Notes (Deleted)
Office Visit Note  Patient: Courtney Kramer             Date of Birth: 03/01/1961           MRN: [id number]             PCP: Erskine Emery, NP Referring: Erskine Emery, NP Visit Date: 07/30/2023 Occupation: @GUAROCC @  Subjective:  No chief complaint on file.   History of Present Illness: Courtney Kramer is a 62 y.o. female ***     Activities of Daily Living:  Patient reports morning stiffness for *** {minute/hour:19697}.   Patient {ACTIONS;DENIES/REPORTS:21021675::"Denies"} nocturnal pain.  Difficulty dressing/grooming: {ACTIONS;DENIES/REPORTS:21021675::"Denies"} Difficulty climbing stairs: {ACTIONS;DENIES/REPORTS:21021675::"Denies"} Difficulty getting out of chair: {ACTIONS;DENIES/REPORTS:21021675::"Denies"} Difficulty using hands for taps, buttons, cutlery, and/or writing: {ACTIONS;DENIES/REPORTS:21021675::"Denies"}  No Rheumatology ROS completed.   PMFS History:  Patient Active Problem List   Diagnosis Date Noted   Anxiety 06/11/2023   Depressive disorder 06/11/2023   Dysphagia 06/11/2023   Essential hypertension 06/11/2023   Rheumatoid arthritis involving multiple sites with positive rheumatoid factor (HCC) 04/21/2020   High risk medication use 04/21/2020   Primary osteoarthritis of both hands 04/21/2020   Primary osteoarthritis of both knees 04/21/2020   Primary osteoarthritis of both feet 04/21/2020   DDD (degenerative disc disease), lumbar 04/21/2020   Fracture, Colles, right, closed 04/09/2018   Closed fracture of right distal radius and ulna, initial encounter 03/27/2018   Lumbar radiculopathy 09/19/2016   RENAL CALCULUS 05/04/2008   FLANK PAIN, RIGHT 05/04/2008    Past Medical History:  Diagnosis Date   Anxiety    Chronic back pain    Chronic back pain    GERD (gastroesophageal reflux disease)    History of cardiac murmur    07-26-2020  per pt happen after last pregnancy, work-up done was ok, and was told few years pcp no longer hears it    History of kidney stones    Kidney stones    OA (osteoarthritis)    hands , knees, feet   RA (rheumatoid arthritis) (HCC)    rheumotology--- Dr Kathie Rhodes. Deveshwar--- multiple sites seropositive/ positive rheumotoid factor,  taking plaquinel   Right ureteral calculus    Wears glasses     Family History  Problem Relation Age of Onset   Fibromyalgia Mother    Heart disease Mother    Macular degeneration Mother    Rheum arthritis Father    Rheum arthritis Sister    Cancer Brother    Breast cancer Neg Hx    Past Surgical History:  Procedure Laterality Date   COLONOSCOPY  2012   CYSTO/  RIGHT URETERAL STENT PLACEMENT  05-10-2008   CYSTOSCOPY W/ URETERAL STENT PLACEMENT Right 06/09/2014   Procedure: CYSTOSCOPY WITH RETROGRADE PYELOGRAM/URETERAL STENT PLACEMENT;  Surgeon: Valetta Fuller, MD;  Location: [facility];  Service: Urology;  Laterality: Right;   CYSTOSCOPY W/ URETERAL STENT PLACEMENT Left 02/17/2020   Procedure: CYSTOSCOPY WITH RETROGRADE PYELOGRAM/URETERAL STENT PLACEMENT;  Surgeon: Sebastian Ache, MD;  Location: [facility];  Service: Urology;  Laterality: Left;  45 MINS   CYSTOSCOPY/URETEROSCOPY/HOLMIUM LASER/STENT PLACEMENT Left 02/22/2020   Procedure: CYSTOSCOPY/LEFT URETEROSCOPY/HOLMIUM LASER/STENT EXCHANGE;  Surgeon: [name], MD;  Location: [facility];  Service: Urology;  Laterality: Left;   CYSTOSCOPY/URETEROSCOPY/HOLMIUM LASER/STENT PLACEMENT Right 07/28/2020   Procedure: CYSTOSCOPY RIGHT URETEROSCOPY RIGHT RETROGRADE PYELOGRAM HOLMIUM LASER/STENT PLACEMENT;  Surgeon: [name], MD;  Location: [facility];  Service: Urology;  Laterality: Right;   EXTRACORPOREAL SHOCK WAVE LITHOTRIPSY Right 05-10-2008  KNEE ARTHROSCOPY Left 2009   OPEN REDUCTION INTERNAL FIXATION (ORIF) DISTAL RADIAL FRACTURE Right 03/27/2018   Procedure: OPEN REDUCTION INTERNAL FIXATION (ORIF) RIGHT DISTAL RADIUS FRACTURE;  Surgeon: Tarry Kos, MD;  Location: [facility];   Service: Orthopedics;  Laterality: Right;   WISDOM TOOTH EXTRACTION  AGE 49   Social History   Social History Narrative   Not on file   Immunization History  Administered Date(s) Administered   Influenza Inj Mdck Quad With Preservative 08/02/2019   Influenza,inj,Quad PF,6+ Mos 06/30/2018     Objective: Vital Signs: There were no vitals taken for this visit.   Physical Exam   Musculoskeletal Exam: ***  CDAI Exam: CDAI Score: -- Patient Global: --; Provider Global: -- Swollen: --; Tender: -- Joint Exam 07/30/2023   No joint exam has been documented for this visit   There is currently no information documented on the homunculus. Go to the Rheumatology activity and complete the homunculus joint exam.  Investigation: No additional findings.  Imaging: No results found.  Recent Labs: Lab Results  Component Value Date   WBC 6.0 05/27/2023   HGB 15.4 05/27/2023   PLT 309 05/27/2023   NA 141 05/27/2023   K 4.2 05/27/2023   CL 104 05/27/2023   CO2 30 05/27/2023   GLUCOSE 84 05/27/2023   BUN 17 05/27/2023   CREATININE 0.75 05/27/2023   BILITOT 0.7 05/27/2023   AST 23 05/27/2023   ALT 17 05/27/2023   PROT 7.0 05/27/2023   CALCIUM 9.9 05/27/2023   GFRAA 87 03/15/2021   QFTBGOLDPLUS NEGATIVE 02/28/2023    Speciality Comments: PLQ eye exam: 07/12/2022 WNL. [facility] f/u 1 year. Arava- no reponse dcd after 4 months Enbrel 04/02/22 Patient does not want to take methotrexate as she believes it caused toxicity to her father.  Procedures:  No procedures performed Allergies: Sulfa antibiotics   Assessment / Plan:     Visit Diagnoses: Rheumatoid arthritis involving multiple sites with positive rheumatoid factor (HCC)  High risk medication use  Pseudogout  Primary osteoarthritis of both hands  Effusion, right knee  Primary osteoarthritis of both knees  Primary osteoarthritis of both feet  Degeneration of intervertebral disc of lumbar region without  discogenic back pain or lower extremity pain  Other fatigue  Closed Colles' fracture of right radius with routine healing, subsequent encounter  RENAL CALCULUS  Essential hypertension  Family history of rheumatoid arthritis  Orders: No orders of the defined types were placed in this encounter.  No orders of the defined types were placed in this encounter.   Face-to-face time spent with patient was *** minutes. Greater than 50% of time was spent in counseling and coordination of care.  Follow-Up Instructions: No follow-ups on file.   Gearldine Bienenstock, PA-C  Note - This record has been created using Dragon software.  Chart creation errors have been sought, but may not always  have been located. Such creation errors do not reflect on  the standard of medical care.

## 2023-07-16 NOTE — Telephone Encounter (Signed)
Last Fill: 04/29/2023  Eye exam: 10/262023  I called patient, PLQ Eye Exam, patient has appt 07/18/2023  Labs: 05/27/2023  CBC and CMP WNL   Next Visit: 07/30/2023  Last Visit: 05/27/2023  [id number] arthritis involving multiple sites with positive rheumatoid factor   Current Dose per office note 05/27/2023: plaquenil 200 mg 1 tablet by mouth twice daily   Okay to refill Plaquenil?

## 2023-07-25 NOTE — Progress Notes (Addendum)
Office Visit Note  Patient: Courtney Kramer             Date of Birth: [birth year]           MRN: [id number]             PCP: Erskine Emery, NP Referring: Erskine Emery, NP Visit Date: 07/29/2023 Occupation: @GUAROCC @  Subjective:  Discuss medication options   History of Present Illness: Courtney Kramer is a 62 y.o. female with history of seropositive rheumatoid arthritis, osteoarthritis, and DDD.  Patient remains on  Humira 40 mg sq injections every 14 days and plaquenil 200 mg 1 tablet by mouth twice daily.  Patient states that her last dose of Humira was administered on 07/24/2023.  Patient states that she continued to have excessive itching and a rash on her torso after each Humira injection.  Patient presents today to discuss treatment alternatives.  Patient states that Humira has not been effective at managing her arthritis.  She continues to have recurrent flares.  Patient states that she is also had increased discomfort in her lower back radiating down her left leg.  She has been under the care of Dr. Alvester Morin.  She underwent an epidural injection on 06/10/2023 which provided temporary relief but she continues to have symptoms of radiculopathy.  She is scheduled for an MRI on 08/01/2023 for further evaluation.    Activities of Daily Living:  Patient reports morning stiffness for 30 minutes.   Patient Reports nocturnal pain.  Difficulty dressing/grooming: Denies Difficulty climbing stairs: Reports Difficulty getting out of chair: Reports Difficulty using hands for taps, buttons, cutlery, and/or writing: Denies  Review of Systems  Constitutional:  Positive for fatigue.  HENT:  Negative for mouth sores and mouth dryness.   Eyes:  Negative for dryness.  Respiratory:  Negative for shortness of breath.   Cardiovascular:  Negative for chest pain and palpitations.  Gastrointestinal:  Negative for blood in stool, constipation and diarrhea.  Endocrine: Negative for increased  urination.  Genitourinary:  Negative for involuntary urination.  Musculoskeletal:  Positive for joint pain, gait problem, joint pain, joint swelling, myalgias, morning stiffness, muscle tenderness and myalgias. Negative for muscle weakness.  Skin:  Negative for color change, rash, hair loss and sensitivity to sunlight.  Allergic/Immunologic: Negative for susceptible to infections.  Neurological:  Positive for headaches. Negative for dizziness and numbness.  Hematological:  Negative for swollen glands.  Psychiatric/Behavioral:  Positive for depressed mood and sleep disturbance. The patient is nervous/anxious.     PMFS History:  Patient Active Problem List   Diagnosis Date Noted   Anxiety 06/11/2023   Depressive disorder 06/11/2023   Dysphagia 06/11/2023   Essential hypertension 06/11/2023   Rheumatoid arthritis involving multiple sites with positive rheumatoid factor (HCC) 04/21/2020   High risk medication use 04/21/2020   Primary osteoarthritis of both hands 04/21/2020   Primary osteoarthritis of both knees 04/21/2020   Primary osteoarthritis of both feet 04/21/2020   DDD (degenerative disc disease), lumbar 04/21/2020   Fracture, Colles, right, closed 04/09/2018   Closed fracture of right distal radius and ulna, initial encounter 03/27/2018   Lumbar radiculopathy 09/19/2016   RENAL CALCULUS 05/04/2008   FLANK PAIN, RIGHT 05/04/2008    Past Medical History:  Diagnosis Date   Anxiety    Chronic back pain    Chronic back pain    GERD (gastroesophageal reflux disease)    History of cardiac murmur    07-26-2020  per pt happen after last  pregnancy, work-up done was ok, and was told few years pcp no longer hears it   History of kidney stones    Kidney stones    OA (osteoarthritis)    hands , knees, feet   RA (rheumatoid arthritis) (HCC)    rheumotology--- Dr Kathie Rhodes. Deveshwar--- multiple sites seropositive/ positive rheumotoid factor,  taking plaquinel   Right ureteral calculus     Wears glasses     Family History  Problem Relation Age of Onset   Fibromyalgia Mother    Heart disease Mother    Macular degeneration Mother    Rheum arthritis Father    Rheum arthritis Sister    Cancer Brother    Breast cancer Neg Hx    Past Surgical History:  Procedure Laterality Date   COLONOSCOPY  2012   CYSTO/  RIGHT URETERAL STENT PLACEMENT  05-10-2008   CYSTOSCOPY W/ URETERAL STENT PLACEMENT Right 06/09/2014   Procedure: CYSTOSCOPY WITH RETROGRADE PYELOGRAM/URETERAL STENT PLACEMENT;  Surgeon: Valetta Fuller, MD;  Location: [facility];  Service: Urology;  Laterality: Right;   CYSTOSCOPY W/ URETERAL STENT PLACEMENT Left 02/17/2020   Procedure: CYSTOSCOPY WITH RETROGRADE PYELOGRAM/URETERAL STENT PLACEMENT;  Surgeon: Sebastian Ache, MD;  Location: [facility];  Service: Urology;  Laterality: Left;  45 MINS   CYSTOSCOPY/URETEROSCOPY/HOLMIUM LASER/STENT PLACEMENT Left 02/22/2020   Procedure: CYSTOSCOPY/LEFT URETEROSCOPY/HOLMIUM LASER/STENT EXCHANGE;  Surgeon: [name], MD;  Location: [facility];  Service: Urology;  Laterality: Left;   CYSTOSCOPY/URETEROSCOPY/HOLMIUM LASER/STENT PLACEMENT Right 07/28/2020   Procedure: CYSTOSCOPY RIGHT URETEROSCOPY RIGHT RETROGRADE PYELOGRAM HOLMIUM LASER/STENT PLACEMENT;  Surgeon: [name], MD;  Location: [facility];  Service: Urology;  Laterality: Right;   EXTRACORPOREAL SHOCK WAVE LITHOTRIPSY Right 05-10-2008   KNEE ARTHROSCOPY Left 2009   OPEN REDUCTION INTERNAL FIXATION (ORIF) DISTAL RADIAL FRACTURE Right 03/27/2018   Procedure: OPEN REDUCTION INTERNAL FIXATION (ORIF) RIGHT DISTAL RADIUS FRACTURE;  Surgeon: Tarry Kos, MD;  Location: [facility];  Service: Orthopedics;  Laterality: Right;   WISDOM TOOTH EXTRACTION  AGE 48   Social History   Social History Narrative   Not on file   Immunization History  Administered Date(s) Administered   Influenza Inj Mdck Quad With Preservative 08/02/2019    Influenza,inj,Quad PF,6+ Mos 06/30/2018     Objective: Vital Signs: BP (!) 146/83 (BP Location: Right Arm, Patient Position: Sitting, Cuff Size: Normal)   Pulse 88   Resp 16   Ht 5\' 6"  (1.676 m)   Wt 197 lb 3.2 oz (89.4 kg)   BMI 31.83 kg/m    Physical Exam Vitals and nursing note reviewed.  Constitutional:      Appearance: She is well-developed.  HENT:     Head: Normocephalic and atraumatic.  Eyes:     Conjunctiva/sclera: Conjunctivae normal.  Cardiovascular:     Rate and Rhythm: Normal rate and regular rhythm.     Heart sounds: Normal heart sounds.  Pulmonary:     Effort: Pulmonary effort is normal.     Breath sounds: Normal breath sounds.  Abdominal:     General: Bowel sounds are normal.     Palpations: Abdomen is soft.  Musculoskeletal:     Cervical back: Normal range of motion.  Lymphadenopathy:     Cervical: No cervical adenopathy.  Skin:    General: Skin is warm and dry.     Capillary Refill: Capillary refill takes less than 2 seconds.  Neurological:     Mental Status: She is alert and oriented to person, place,  and time.  Psychiatric:        Behavior: Behavior normal.      Musculoskeletal Exam: C-spine, thoracic spine, and lumbar spine good ROM. Shoulder joints, elbow joints, wrist joints, MCPs, PIPs, and DIPs good ROM with no synovitis.  Complete fist formation bilaterally.  Tenderness of bilateral 3rd PIP joints.  Hip joints have good ROM with no groin pain.  Knee joints have good ROM with no warmth or effusion.  Ankle joints have good ROM-no synovitis noted.    CDAI Exam: CDAI Score: -- Patient Global: --; Provider Global: -- Swollen: --; Tender: -- Joint Exam 07/29/2023   No joint exam has been documented for this visit   There is currently no information documented on the homunculus. Go to the Rheumatology activity and complete the homunculus joint exam.  Investigation: No additional findings.  Imaging: No results found.  Recent Labs: Lab  Results  Component Value Date   WBC 6.0 05/27/2023   HGB 15.4 05/27/2023   PLT 309 05/27/2023   NA 141 05/27/2023   K 4.2 05/27/2023   CL 104 05/27/2023   CO2 30 05/27/2023   GLUCOSE 84 05/27/2023   BUN 17 05/27/2023   CREATININE 0.75 05/27/2023   BILITOT 0.7 05/27/2023   AST 23 05/27/2023   ALT 17 05/27/2023   PROT 7.0 05/27/2023   CALCIUM 9.9 05/27/2023   GFRAA 87 03/15/2021   QFTBGOLDPLUS NEGATIVE 02/28/2023    Speciality Comments: PLQ eye exam: 07/18/2023 WNL. [facility] f/u 1 year.   Arava- no reponse dcd after 4 months Enbrel 04/02/22 Patient does not want to take methotrexate as she believes it caused toxicity to her father.  Procedures:  No procedures performed Allergies: Sulfa antibiotics   Assessment / Plan:     Visit Diagnoses: Rheumatoid arthritis involving multiple sites with positive rheumatoid factor (HCC) - Patient presents today to discuss treatment alternatives.  She is currently on Humira 40 mg subcutaneous injections every 14 days and Plaquenil 200 mg 1 tablet by mouth twice daily.  After each Humira injection she notices diffuse excessive itching on her torso and intermittent rashes.  She has also been having more frequent flares of rheumatoid arthritis since switching from Enbrel to Humira.  She would like to discuss treatment alternatives.  Her last dose of Humira was administered on 07/24/2023.  At her last office visit we briefly discussed switching to Actemra.  Indications, contraindications, and potential side effects of Actemra were reviewed today in detail.  All questions were addressed and consent was obtained.  Plan to apply for Actemra through her insurance and once approved she will return to the office for administration of the first injection.  She was in agreement.  She will remain on Plaquenil as prescribed for combination therapy.  Marland Kitchen   She will require updated lab work in 1 month and every 3 months.  She will follow-up in the office in 6 to 8  weeks to assess her response.  Plan: Lipid panel  Medication counseling:  Baseline Immunosuppressant Therapy Labs     Latest Ref Rng & Units 02/28/2023    9:48 AM  Quantiferon TB Gold  Quantiferon TB Gold Plus NEGATIVE NEGATIVE        Latest Ref Rng & Units 07/22/2019    8:58 AM  Hepatitis  Hep B Surface Ag NON-REACTI NON-REACTIVE   Hep B IgM NON-REACTI NON-REACTIVE   Hep C Ab NON-REACTI NON-REACTIVE     Lab Results  Component Value Date  HIV NON-REACTIVE 07/22/2019       Latest Ref Rng & Units 07/22/2019    8:58 AM  Immunoglobulin Electrophoresis  IgA  47 - 310 mg/dL 409   IgG 811 - 9,147 mg/dL 829   IgM 50 - 562 mg/dL 130        Latest Ref Rng & Units 05/27/2023    9:27 AM  Serum Protein Electrophoresis  Total Protein 6.1 - 8.1 g/dL 7.0     Lab Results  Component Value Date   G6PDH 13.0 07/22/2019    Lipid Panel: Pending  Chest x-ray: No active cardiopulmonary disease 03/26/22   Counseled patient that Actemra is an IL-6 blocking agent.   Counseled patient on purpose, proper use, and adverse effects of Actemra.  Reviewed the most common adverse effects including infections, injection site reaction, bowel injury, and rarely cancer and conditions of the nervous system.  Reviewed that the medication should be held during infections.  Discussed that there is the possibility of an increased risk of malignancy but it is not well understood if this increased risk is due to the medication or the disease state.  Counseled patient that Actemra should be held prior to scheduled surgery.  Counseled patient to avoid live vaccines while on Actemra.  Recommend annual influenza, Pneumovax 23, Prevnar 13, and Shingrix as indicated.   Reviewed the importance of regular labs while on Actemra therapy including the need for routine lipid panel.  Advised patient to get standing labs one month after starting Actemra.  Provided patient with standing lab orders. P rovided patient with  medication education material and answered all questions.  Patient voiced understanding.  Patient consented to Actemra.  Will upload consent into the media tab.  Reviewed storage instructions of Actemra with patient.  Advised patient that initial Actemra injection must be given in the office.  Will apply for Actemra through patient's insurance.    Patient dose will be  162 mg every 14 days based on weight <100 kg .  Prescription pending lab results and/or insurance approval.  High risk medication use - Plaquenil 200 mg 1 tablet by mouth twice daily. Plan to apply for Actemra 162 mg sq injections every 14 days.   Last dose of humira was administered on 07/24/23 CBC and CMP updated on 05/27/23. Her next lab work will be due in December and every 3 months.  TB gold negative on 02/28/23.  Discussed the importance of holding humira if she develops signs or symptoms of an infection and to resume once the infection has completely cleared.  PLQ eye exam: 07/18/2023 WNL. [facility] f/u 1 year.   Plan: COMPLETE METABOLIC PANEL WITH GFR, CBC with Differential/Platelet, Lipid panel  Pseudogout: No recent pseudogout flares.  Primary osteoarthritis of both hands: She has PIP and DIP thickening consistent with osteoarthritis of both hands.  She has tenderness over bilateral third PIP joints.   Effusion, right knee: Patient continues to experience intermittent discomfort in the right knee.  No effusion was noted on examination today.  Primary osteoarthritis of both knees -No warmth or effusion noted today.  She is taking Celebrex 200 mg 1 capsule by mouth daily.   Primary osteoarthritis of both feet: She has intermittent discomfort in both feet.    Degeneration of intervertebral disc of lumbar region without discogenic back pain or lower extremity pain - Under care of Dr. Alvester Morin.  Patient underwent an epidural injection on 06/10/2023.  She has had persistent radiating pain down the left  lower extremity.  She is  scheduled for a MRI of the lumbar spine on 08/01/23.   Medical conditions are listed as follows:  Other fatigue  Closed Colles' fracture of right radius with routine healing, subsequent encounter  RENAL CALCULUS  Essential hypertension: Blood pressure was elevated today in the office and was rechecked prior to leaving.  Patient was advised to monitor blood pressure closely and to reach out to PCP if remains elevated.  Family history of rheumatoid arthritis  Orders: Orders Placed This Encounter  Procedures   COMPLETE METABOLIC PANEL WITH GFR   CBC with Differential/Platelet   Lipid panel   No orders of the defined types were placed in this encounter.  Follow-Up Instructions: Return in about 5 months (around 12/27/2023) for Rheumatoid arthritis, Osteoarthritis, DDD.   Gearldine Bienenstock, PA-C  Note - This record has been created using Dragon software.  Chart creation errors have been sought, but may not always  have been located. Such creation errors do not reflect on  the standard of medical care.

## 2023-07-29 ENCOUNTER — Ambulatory Visit: Payer: Managed Care, Other (non HMO) | Attending: Physician Assistant | Admitting: Physician Assistant

## 2023-07-29 ENCOUNTER — Other Ambulatory Visit: Payer: Self-pay | Admitting: Pharmacist

## 2023-07-29 ENCOUNTER — Encounter: Payer: Self-pay | Admitting: Physician Assistant

## 2023-07-29 VITALS — BP 146/83 | HR 88 | Resp 16 | Ht 66.0 in | Wt 197.2 lb

## 2023-07-29 DIAGNOSIS — M1120 Other chondrocalcinosis, unspecified site: Secondary | ICD-10-CM

## 2023-07-29 DIAGNOSIS — Z79899 Other long term (current) drug therapy: Secondary | ICD-10-CM

## 2023-07-29 DIAGNOSIS — M25461 Effusion, right knee: Secondary | ICD-10-CM

## 2023-07-29 DIAGNOSIS — R5383 Other fatigue: Secondary | ICD-10-CM

## 2023-07-29 DIAGNOSIS — N200 Calculus of kidney: Secondary | ICD-10-CM

## 2023-07-29 DIAGNOSIS — M0579 Rheumatoid arthritis with rheumatoid factor of multiple sites without organ or systems involvement: Secondary | ICD-10-CM | POA: Diagnosis not present

## 2023-07-29 DIAGNOSIS — Z8261 Family history of arthritis: Secondary | ICD-10-CM

## 2023-07-29 DIAGNOSIS — M19041 Primary osteoarthritis, right hand: Secondary | ICD-10-CM | POA: Diagnosis not present

## 2023-07-29 DIAGNOSIS — M19042 Primary osteoarthritis, left hand: Secondary | ICD-10-CM

## 2023-07-29 DIAGNOSIS — M19071 Primary osteoarthritis, right ankle and foot: Secondary | ICD-10-CM

## 2023-07-29 DIAGNOSIS — M170 Bilateral primary osteoarthritis of knee: Secondary | ICD-10-CM

## 2023-07-29 DIAGNOSIS — I10 Essential (primary) hypertension: Secondary | ICD-10-CM

## 2023-07-29 DIAGNOSIS — M19072 Primary osteoarthritis, left ankle and foot: Secondary | ICD-10-CM

## 2023-07-29 DIAGNOSIS — M51369 Other intervertebral disc degeneration, lumbar region without mention of lumbar back pain or lower extremity pain: Secondary | ICD-10-CM

## 2023-07-29 DIAGNOSIS — S52531D Colles' fracture of right radius, subsequent encounter for closed fracture with routine healing: Secondary | ICD-10-CM

## 2023-07-29 NOTE — Progress Notes (Signed)
Patient switching from Humira to Actemra SQ. Pending OV note from today, please start Actemra SQ BIV  Dose: 162mg  SQ every 14 days  Her last dose of Humira was on 07/24/2023. Once approved, she will be able to start Actemra on or after 08/07/2023  Chesley Mires, PharmD, MPH, BCPS, CPP Clinical Pharmacist (Rheumatology and Pulmonology)

## 2023-07-29 NOTE — Patient Instructions (Signed)
Standing Labs We placed an order today for your standing lab work.   Please have your standing labs drawn in 1 month then every 3 months  Please have your labs drawn 2 weeks prior to your appointment so that the provider can discuss your lab results at your appointment, if possible.  Please note that you may see your imaging and lab results in MyChart before we have reviewed them. We will contact you once all results are reviewed. Please allow our office up to 72 hours to thoroughly review all of the results before contacting the office for clarification of your results.  WALK-IN LAB HOURS  Monday through Thursday from 8:00 am -12:30 pm and 1:00 pm-5:00 pm and Friday from 8:00 am-12:00 pm.  Patients with office visits requiring labs will be seen before walk-in labs.  You may encounter longer than normal wait times. Please allow additional time. Wait times may be shorter on  Monday and Thursday afternoons.  We do not book appointments for walk-in labs. We appreciate your patience and understanding with our staff.   Labs are drawn by [facility]. Please bring your co-pay at the time of your lab draw.  You may receive a bill from [facility] for your lab work.  Please note if you are on Hydroxychloroquine and and an order has been placed for a Hydroxychloroquine level,  you will need to have it drawn 4 hours or more after your last dose.  If you wish to have your labs drawn at another location, please call the office 24 hours in advance so we can fax the orders.  The office is located at [address], [place], [place] [address]   If you have any questions regarding directions or hours of operation,  please call [phone].   As a reminder, please drink plenty of water prior to coming for your lab work. Thanks!  Tocilizumab Injection What is this medication? TOCILIZUMAB (TOE si LIZ ue mab) treats autoimmune conditions, such as arthritis. It works by slowing down an overactive immune  system. It may also be used to treat severe COVID-19 in people who are hospitalized. It is a monoclonal antibody. This medicine may be used for other purposes; ask your health care provider or pharmacist if you have questions. COMMON BRAND NAME(S): Actemra, TOFIDENCE, Tyenne What should I tell my care team before I take this medication? They need to know if you have any of these conditions: Cancer Diabetes Heart disease History of or current hepatitis B infection High blood pressure High cholesterol Immune system problems Infection Liver disease Low blood counts, such as low white cell, platelet, or red cell counts Multiple sclerosis Recent or upcoming vaccine Stomach or intestine problems Stroke An unusual or allergic reaction to tocilizumab, other medications, foods, dyes, or preservatives Pregnant or trying to get pregnant Breast-feeding How should I use this medication? This medication is injected into a vein or under the skin. It may be given by your care team in a hospital or clinic setting. It may also be given at home. If you get this medication at home, you will be taught how to prepare and give it. Use it exactly as directed. Take it as directed on the prescription label. Keep taking it unless your care team tells you to stop. If you use a pen, be sure to take off the outer needle cover before using the dose. It is important that you put your used needles and syringes in a special sharps container. Do not  put them in a trash can. If you do not have a sharps container, call your pharmacist or care team to get one. A special MedGuide will be given to you by the pharmacist with each prescription and refill. Be sure to read this information carefully each time. Talk to your care team about the use of this medication in children. While it may be prescribed for children as young as 2 years for selected conditions, precautions do apply. Overdosage: If you think you have taken too much  of this medicine contact a poison control center or emergency room at once. NOTE: This medicine is only for you. Do not share this medicine with others. What if I miss a dose? If you get this medication at the hospital or clinic: It is important not to miss your dose. Call your care team if you are unable to keep an appointment. If you give yourself this medication at home: If you miss a dose, take it as soon as you can. If it is almost time for your next dose, take only that dose. Do not take double or extra doses. Call your care team with questions. What may interact with this medication? Do not take this medication with any of the following: Live virus vaccines This medication may also interact with the following: Biologic medications, such as abatacept, adalimumab, anakinra, certolizumab, etanercept, golimumab, infliximab, rituximab, secukinumab, ustekinumab Certain medications for cholesterol, such as atorvastatin, lovastatin, simvastatin Cyclosporine Estrogen or progestin hormones Omeprazole Steroid medications, such as prednisone or cortisone Theophylline Vaccines Warfarin This list may not describe all possible interactions. Give your health care provider a list of all the medicines, herbs, non-prescription drugs, or dietary supplements you use. Also tell them if you smoke, drink alcohol, or use illegal drugs. Some items may interact with your medicine. What should I watch for while using this medication? Visit your care team for regular checks on your progress. Your condition will be monitored carefully while you are receiving this medication. Tell your care team if your symptoms do not start to get better or if they get worse. You may need blood work done while taking this medication. You will be tested for tuberculosis (TB) before you start this medication. If your care team prescribes any medication for TB, you should start taking the TB medication before starting this medication.  Make sure to finish the full course of TB medication. This medication may increase your risk of getting an infection. Call your care team for advice if you get a fever, chills, sore throat, or other symptoms of a cold or flu. Do not treat yourself. Try to avoid being around people who are sick. Talk to your care team about your risk of cancer. You may be more at risk for certain types of cancers if you take this medication. What side effects may I notice from receiving this medication? Side effects that you should report to your care team as soon as possible: Allergic reactions--skin rash, itching, hives, swelling of the face, lips, tongue, or throat Infection--fever, chills, cough, sore throat, wounds that don't heal, pain or trouble when passing urine, general feeling of discomfort or being unwell Liver injury--right upper belly pain, loss of appetite, nausea, light-colored stool, dark yellow or brown urine, yellowing skin or eyes, unusual weakness or fatigue Stomach pain that is severe, does not go away, or gets worse Unusual bruising or bleeding Side effects that usually do not require medical attention (report to your care team if they continue or  are bothersome): Dizziness Headache Increase in blood pressure Pain, redness, or irritation at injection site Runny or stuffy nose Sore throat Stomach pain This list may not describe all possible side effects. Call your doctor for medical advice about side effects. You may report side effects to FDA at 1-800-FDA-1088. Where should I keep my medication? Keep out of the reach of children and pets. You will be instructed on how to store this medication. Get rid of any unused medication after the expiration date on the label. To get rid of medications that are no longer needed or have expired: Take the medication to a medication take-back program. Check with your pharmacy or law enforcement to find a location. If you cannot return the medication,  ask your pharmacist or care team how to get rid of this medication safely. NOTE: This sheet is a summary. It may not cover all possible information. If you have questions about this medicine, talk to your doctor, pharmacist, or health care provider.  2024 Elsevier/Gold Standard (2021-12-13 00:00:00)

## 2023-07-29 NOTE — Progress Notes (Signed)
Pharmacy Note Subjective: Patient presents today to the [facility] for follow up office visit. Patient seen by the pharmacist for counseling on Actemra for rheumatoid arthritis.  She states she has had itchiness from Humira in abdomen (she administers injections in thighs). Patient reports recurrent flares while on treatment. Had waning and minimal response when treated Enbrel (prior to Humira) as well.  History of hyperlipidemia: No  History of diverticulitis: No  Objective: CBC    Component Value Date/Time   WBC 6.0 05/27/2023 0927   RBC 4.79 05/27/2023 0927   HGB 15.4 05/27/2023 0927   HCT 44.1 05/27/2023 0927   PLT 309 05/27/2023 0927   MCV 92.1 05/27/2023 0927   MCH 32.2 05/27/2023 0927   MCHC 34.9 05/27/2023 0927   RDW 11.5 05/27/2023 0927   LYMPHSABS 2,070 05/27/2023 0927   EOSABS 282 05/27/2023 0927   BASOSABS 72 05/27/2023 0927    CMP     Component Value Date/Time   NA 141 05/27/2023 0927   K 4.2 05/27/2023 0927   CL 104 05/27/2023 0927   CO2 30 05/27/2023 0927   GLUCOSE 84 05/27/2023 0927   BUN 17 05/27/2023 0927   CREATININE 0.75 05/27/2023 0927   CALCIUM 9.9 05/27/2023 0927   PROT 7.0 05/27/2023 0927   AST 23 05/27/2023 0927   ALT 17 05/27/2023 0927   BILITOT 0.7 05/27/2023 0927   GFRNONAA 75 03/15/2021 1028   GFRAA 87 03/15/2021 1028     Baseline Immunosuppressant Therapy Labs     Latest Ref Rng & Units 02/28/2023    9:48 AM  Quantiferon TB Gold  Quantiferon TB Gold Plus NEGATIVE NEGATIVE        Latest Ref Rng & Units 07/22/2019    8:58 AM  Hepatitis  Hep B Surface Ag NON-REACTI NON-REACTIVE   Hep B IgM NON-REACTI NON-REACTIVE   Hep C Ab NON-REACTI NON-REACTIVE     Lab Results  Component Value Date   HIV NON-REACTIVE 07/22/2019       Latest Ref Rng & Units 07/22/2019    8:58 AM  Immunoglobulin Electrophoresis  IgA  47 - 310 mg/dL 098   IgG 119 - 1,478 mg/dL 295   IgM 50 - 621 mg/dL 308        Latest Ref Rng &  Units 05/27/2023    9:27 AM  Serum Protein Electrophoresis  Total Protein 6.1 - 8.1 g/dL 7.0     Lab Results  Component Value Date   G6PDH 13.0 07/22/2019    No results found for: "TPMT"   Lipid Panel No results found for: "CHOL", "HDL", "LDLCALC", "LDLDIRECT", "TRIG", "CHOLHDL"   Chest x-ray: 03/26/2022 - No active cardiopulmonary disease.  Assessment/Plan:  Counseled patient that Actemra is an IL-6 blocking agent.  Counseled patient on purpose, proper use, and adverse effects of Actemra.  Reviewed the most common adverse effects including infections, bowel injury, and rarely cancer and conditions of the nervous system.  Reviewed risk of lipid abnormalities and elevated liver enzymes. Discussed that there is the possibility of an increased risk of malignancy but it is not well understood if this increased risk is due to the medication or the disease state. Counseled patient that Actemra should be held prior to scheduled surgery for infections or new antibiotic use. Counseled patient that Actemra should be held prior to scheduled surgery. Recommend annual influenza, PCV 15 or PCV20 or Pneumovax 23, and Shingrix as indicated.  Reviewed the importance of regular labs while on Actemra therapy  including the need for routine lipid panel.  Will recheck lipid panel after 4-8 weeks of starting Actemra, then every 6 months routinely. CBC and CMP will be monitored every 3 months. TB gold will be monitored annually. Standing orders placed.  Provided patient with medication education material and answered all questions.  Patient voiced understanding.  Patient consented to Actemra.  Will upload consent into the media tab.  Reviewed storage instructions of Actemra with patient.  Advised patient that initial Actemra injection must be given in the office.  Will apply for Actemra through patient's insurance for self-administration using auto-injector/prefilled syringe.  Patient dose will be  162 mg every 14 days  based on weight <100 kg .  Prescription pending lab results and/or insurance approval. Her last dose of Humira was on 07/24/2023. Once approved, she will be able to start Actemra on or after 08/07/2023  Chesley Mires, PharmD, MPH, BCPS, CPP Clinical Pharmacist (Rheumatology and Pulmonology)

## 2023-07-29 NOTE — Addendum Note (Signed)
Addended by: Gearldine Bienenstock on: 07/29/2023 10:05 AM   Modules accepted: Level of Service

## 2023-07-30 ENCOUNTER — Ambulatory Visit: Payer: Commercial Managed Care - HMO | Admitting: Physician Assistant

## 2023-07-30 DIAGNOSIS — M1120 Other chondrocalcinosis, unspecified site: Secondary | ICD-10-CM

## 2023-07-30 DIAGNOSIS — M19041 Primary osteoarthritis, right hand: Secondary | ICD-10-CM

## 2023-07-30 DIAGNOSIS — N200 Calculus of kidney: Secondary | ICD-10-CM

## 2023-07-30 DIAGNOSIS — M51369 Other intervertebral disc degeneration, lumbar region without mention of lumbar back pain or lower extremity pain: Secondary | ICD-10-CM

## 2023-07-30 DIAGNOSIS — M25461 Effusion, right knee: Secondary | ICD-10-CM

## 2023-07-30 DIAGNOSIS — R5383 Other fatigue: Secondary | ICD-10-CM

## 2023-07-30 DIAGNOSIS — M170 Bilateral primary osteoarthritis of knee: Secondary | ICD-10-CM

## 2023-07-30 DIAGNOSIS — M19071 Primary osteoarthritis, right ankle and foot: Secondary | ICD-10-CM

## 2023-07-30 DIAGNOSIS — I10 Essential (primary) hypertension: Secondary | ICD-10-CM

## 2023-07-30 DIAGNOSIS — M0579 Rheumatoid arthritis with rheumatoid factor of multiple sites without organ or systems involvement: Secondary | ICD-10-CM

## 2023-07-30 DIAGNOSIS — Z79899 Other long term (current) drug therapy: Secondary | ICD-10-CM

## 2023-07-30 DIAGNOSIS — Z8261 Family history of arthritis: Secondary | ICD-10-CM

## 2023-07-30 DIAGNOSIS — S52531D Colles' fracture of right radius, subsequent encounter for closed fracture with routine healing: Secondary | ICD-10-CM

## 2023-07-30 LAB — COMPLETE METABOLIC PANEL WITH GFR
AG Ratio: 1.7 (calc) (ref 1.0–2.5)
ALT: 15 U/L (ref 6–29)
AST: 19 U/L (ref 10–35)
Albumin: 4.1 g/dL (ref 3.6–5.1)
Alkaline phosphatase (APISO): 96 U/L (ref 37–153)
BUN: 16 mg/dL (ref 7–25)
CO2: 28 mmol/L (ref 20–32)
Calcium: 9.2 mg/dL (ref 8.6–10.4)
Chloride: 106 mmol/L (ref 98–110)
Creat: 0.83 mg/dL (ref 0.50–1.05)
Globulin: 2.4 g/dL (ref 1.9–3.7)
Glucose, Bld: 64 mg/dL — ABNORMAL LOW (ref 65–99)
Potassium: 3.9 mmol/L (ref 3.5–5.3)
Sodium: 142 mmol/L (ref 135–146)
Total Bilirubin: 0.4 mg/dL (ref 0.2–1.2)
Total Protein: 6.5 g/dL (ref 6.1–8.1)
eGFR: 80 mL/min/{1.73_m2} (ref >=60)

## 2023-07-30 LAB — CBC WITH DIFFERENTIAL/PLATELET
Absolute Lymphocytes: 1656 {cells}/uL (ref 850–3900)
Absolute Monocytes: 528 {cells}/uL (ref 200–950)
Basophils Absolute: 50 {cells}/uL (ref 0–200)
Basophils Relative: 0.9 %
Eosinophils Absolute: 220 {cells}/uL (ref 15–500)
Eosinophils Relative: 4 %
HCT: 44.5 % (ref 35.0–45.0)
Hemoglobin: 14.7 g/dL (ref 11.7–15.5)
MCH: 30.6 pg (ref 27.0–33.0)
MCHC: 33 g/dL (ref 32.0–36.0)
MCV: 92.7 fL (ref 80.0–100.0)
MPV: 10.4 fL (ref 7.5–12.5)
Monocytes Relative: 9.6 %
Neutro Abs: 3047 {cells}/uL (ref 1500–7800)
Neutrophils Relative %: 55.4 %
Platelets: 299 10*3/uL (ref 140–400)
RBC: 4.8 10*6/uL (ref 3.80–5.10)
RDW: 11.5 % (ref 11.0–15.0)
Total Lymphocyte: 30.1 %
WBC: 5.5 10*3/uL (ref 3.8–10.8)

## 2023-07-30 LAB — LIPID PANEL
Cholesterol: 193 mg/dL (ref <200)
HDL: 67 mg/dL (ref >=50)
LDL Cholesterol (Calc): 103 mg/dL — ABNORMAL HIGH
Non-HDL Cholesterol (Calc): 126 mg/dL (ref <130)
Total CHOL/HDL Ratio: 2.9 (calc) (ref <5.0)
Triglycerides: 124 mg/dL (ref <150)

## 2023-07-30 NOTE — Progress Notes (Signed)
CBC and CMP WNL LDL is borderline elevated-103. Rest of lipid panel WNL. Please forward results to PCP as well as notifying the patient.

## 2023-07-30 NOTE — Progress Notes (Signed)
Submitted a Prior Authorization request to [employer] for ACTEMRA SQ via CoverMyMeds. Will update once we receive a response.  Key: [id number]

## 2023-07-31 ENCOUNTER — Other Ambulatory Visit (HOSPITAL_COMMUNITY): Payer: Self-pay

## 2023-07-31 NOTE — Progress Notes (Signed)
Received notification from [employer] regarding a prior authorization for ACTEMRA SQ. Authorization has been APPROVED from 07/30/2023 to 01/26/2024. Approval letter sent to scan center.  Per test claim, copay for 28 days supply is $0  Patient can fill through [facility]: [phone]   Authorization # [id number]  Enrolled patient into Actemra copay card for next year if needed: ID: [id number] Group: [id number] RxBIN: [id number] PCN: [id number] Copay card phone: [phone]  Chesley Mires, PharmD, MPH, BCPS, CPP Clinical Pharmacist (Rheumatology and Pulmonology)

## 2023-08-01 ENCOUNTER — Ambulatory Visit
Admission: RE | Admit: 2023-08-01 | Discharge: 2023-08-01 | Disposition: A | Discharge status: Home / Self Care | Payer: Managed Care, Other (non HMO) | Source: Ambulatory Visit | Attending: Physical Medicine and Rehabilitation | Admitting: Physical Medicine and Rehabilitation

## 2023-08-01 ENCOUNTER — Other Ambulatory Visit (HOSPITAL_BASED_OUTPATIENT_CLINIC_OR_DEPARTMENT_OTHER): Payer: Self-pay

## 2023-08-01 ENCOUNTER — Other Ambulatory Visit: Payer: Self-pay

## 2023-08-01 DIAGNOSIS — M5416 Radiculopathy, lumbar region: Secondary | ICD-10-CM

## 2023-08-01 MED ORDER — ACTEMRA ACTPEN 162 MG/0.9ML ~~LOC~~ SOAJ
162.0000 mg | SUBCUTANEOUS | 0 refills | Status: DC
  Filled 2023-08-05: qty 1.8, 28d supply, fill #0

## 2023-08-01 NOTE — Progress Notes (Signed)
Patient scheduled for Actemra new start on 08/08/23  Rx sent to [facility] to be couriered to clinic for appointment  Chesley Mires, PharmD, MPH, BCPS, CPP Clinical Pharmacist (Rheumatology and Pulmonology)

## 2023-08-05 ENCOUNTER — Other Ambulatory Visit: Payer: Self-pay

## 2023-08-05 ENCOUNTER — Other Ambulatory Visit (HOSPITAL_COMMUNITY): Payer: Self-pay

## 2023-08-05 NOTE — Patient Instructions (Signed)
Your next ACTEMRA SQ dose is due on 08/22/23, 08/26/23, and every 14 days thereafter  CONTINUE hydroxychloroquine 200mg  twice daily  HOLD ACTEMRA SQ if you have signs or symptoms of an infection. You can resume once you feel better or back to your baseline. HOLD ACTEMRA SQ if you start antibiotics to treat an infection. HOLD ACTEMRA SQ around the time of surgery/procedures. Your surgeon will be able to provide recommendations on when to hold BEFORE and when you are cleared to RESUME.  Pharmacy information: Your prescription will be shipped from [facility]. Their phone number is [phone] They will call to schedule shipment and confirm address. They will mail your medication to your home.  Labs are due in 1 month then every 3 months. Lab hours are from Monday to Thursday 8am-12:30pm and 1pm-5pm and Friday 8am-12pm. You do not need an appointment if you come for labs during these times. If you'd like to go to a [facility] or [facility] closer to home, please call our clinic 48 hours prior to lab date so we can release orders in a timely manner.  Stay up to date on all routine vaccines: influenza, pneumonia, COVID19, Shingles  How to manage an injection site reaction: Remember the 5 C's: COUNTER - leave on the counter at least 30 minutes but up to overnight to bring medication to room temperature. This may help prevent stinging COLD - place something cold (like an ice gel pack or cold water bottle) on the injection site just before cleansing with alcohol. This may help reduce pain CLARITIN - use Claritin (generic name is loratadine) for the first two weeks of treatment or the day of, the day before, and the day after injecting. This will help to minimize injection site reactions CORTISONE CREAM - apply if injection site is irritated and itching CALL ME - if injection site reaction is bigger than the size of your fist, looks infected, blisters, or if you develop hives

## 2023-08-05 NOTE — Progress Notes (Signed)
Specialty Pharmacy Initial Fill Coordination Note  Courtney Kramer is a 62 y.o. female contacted today regarding refills of specialty medication(s) Tocilizumab   Patient requested Courier to Provider Office   Delivery date: 08/07/23   Verified address: [address] [place], [place] [address]   Medication will be filled on 08/05/2023.   Patient is aware of $0.00 copayment.

## 2023-08-05 NOTE — Progress Notes (Unsigned)
Pharmacy Note  Subjective:   Patient presents to clinic today to receive first dose of Actemra SQ for rheumatoid arthritis. Patient currently takes hydroxychloroquine 200mg  twice daily. She is transitioning treatment from Humira (last dose was on 07/24/2023).  Patient running a fever or have signs/symptoms of infection? {yes/no:20286}  Patient currently on antibiotics for the treatment of infection? {yes/no:20286}  Patient have any upcoming invasive procedures/surgeries? {yes/no:20286}  Objective: CMP     Component Value Date/Time   NA 142 07/29/2023 0845   K 3.9 07/29/2023 0845   CL 106 07/29/2023 0845   CO2 28 07/29/2023 0845   GLUCOSE 64 (L) 07/29/2023 0845   BUN 16 07/29/2023 0845   CREATININE 0.83 07/29/2023 0845   CALCIUM 9.2 07/29/2023 0845   PROT 6.5 07/29/2023 0845   AST 19 07/29/2023 0845   ALT 15 07/29/2023 0845   BILITOT 0.4 07/29/2023 0845   GFRNONAA 75 03/15/2021 1028   GFRAA 87 03/15/2021 1028    CBC    Component Value Date/Time   WBC 5.5 07/29/2023 0845   RBC 4.80 07/29/2023 0845   HGB 14.7 07/29/2023 0845   HCT 44.5 07/29/2023 0845   PLT 299 07/29/2023 0845   MCV 92.7 07/29/2023 0845   MCH 30.6 07/29/2023 0845   MCHC 33.0 07/29/2023 0845   RDW 11.5 07/29/2023 0845   LYMPHSABS 2,070 05/27/2023 0927   EOSABS 220 07/29/2023 0845   BASOSABS 50 07/29/2023 0845    Baseline Immunosuppressant Therapy Labs TB GOLD    Latest Ref Rng & Units 02/28/2023    9:48 AM  Quantiferon TB Gold  Quantiferon TB Gold Plus NEGATIVE NEGATIVE    Hepatitis Panel    Latest Ref Rng & Units 07/22/2019    8:58 AM  Hepatitis  Hep B Surface Ag NON-REACTI NON-REACTIVE   Hep B IgM NON-REACTI NON-REACTIVE   Hep C Ab NON-REACTI NON-REACTIVE    HIV Lab Results  Component Value Date   HIV NON-REACTIVE 07/22/2019   Immunoglobulins    Latest Ref Rng & Units 07/22/2019    8:58 AM  Immunoglobulin Electrophoresis  IgA  47 - 310 mg/dL 191   IgG 478 - 2,956 mg/dL 213   IgM  50 - 086 mg/dL 578    SPEP    Latest Ref Rng & Units 07/29/2023    8:45 AM  Serum Protein Electrophoresis  Total Protein 6.1 - 8.1 g/dL 6.5    I6NG Lab Results  Component Value Date   G6PDH 13.0 07/22/2019   TPMT No results found for: "TPMT"   Chest x-ray: 03/27/2022 - No active cardiopulmonary disease.  Assessment/Plan:  Reviewed importance of holding Actemra with signs/symptoms of an infections, if antibiotics are prescribed to treat an active infection, and with invasive procedures  Demonstrated proper injection technique with Actemra ACTPen demo device  Patient able to demonstrate proper injection technique using the teach back method.  Patient self injected in the {injsitedsg:28167} with:  Pharmacy-Supplied Medication: Actemra ActPen 162mg /0.70mL pen injector NDC: *** Lot: *** Expiration: ***  Patient tolerated well.  Observed for 30 mins in office for adverse reaction. {injectionreaction:30756}  Patient is to return in 1 month for labs and 6-8 weeks for follow-up appointment.  Standing orders for CBC/CMP placed.  TB gold will be monitored yearly. Lipid panel will be monitored every 6-12 months.  Actemra SQ approved through insurance .   Rx sent to: [facility]: [phone] .  Patient provided with pharmacy phone number and advised to call later this week  to schedule shipment to home.  Patient will continue Actemra 162mg  subcut every 14 days. Continue hydroxychloroquine 200mg  twice daily  All questions encouraged and answered.  Instructed patient to call with any further questions or concerns.  Chesley Mires, PharmD, MPH, BCPS, CPP Clinical Pharmacist (Rheumatology and Pulmonology)  08/05/2023 4:21 PM

## 2023-08-08 ENCOUNTER — Ambulatory Visit: Payer: Managed Care, Other (non HMO) | Attending: Rheumatology | Admitting: Pharmacist

## 2023-08-08 ENCOUNTER — Telehealth: Payer: Self-pay | Admitting: Physical Medicine and Rehabilitation

## 2023-08-08 ENCOUNTER — Other Ambulatory Visit (HOSPITAL_COMMUNITY): Payer: Self-pay

## 2023-08-08 DIAGNOSIS — Z5181 Encounter for therapeutic drug level monitoring: Secondary | ICD-10-CM

## 2023-08-08 DIAGNOSIS — M0579 Rheumatoid arthritis with rheumatoid factor of multiple sites without organ or systems involvement: Secondary | ICD-10-CM

## 2023-08-08 DIAGNOSIS — Z79899 Other long term (current) drug therapy: Secondary | ICD-10-CM

## 2023-08-08 DIAGNOSIS — Z7189 Other specified counseling: Secondary | ICD-10-CM

## 2023-08-08 MED ORDER — ACTEMRA ACTPEN 162 MG/0.9ML ~~LOC~~ SOAJ
162.0000 mg | SUBCUTANEOUS | 1 refills | Status: DC
  Filled 2023-08-08 – 2023-08-21 (×2): qty 1.8, 28d supply, fill #0
  Filled 2023-09-30: qty 1.8, 28d supply, fill #1

## 2023-08-08 NOTE — Telephone Encounter (Signed)
Patient called needing to schedule an appointment with Dr. Alvester Morin for MRI review. The number to contact patient is [phone]

## 2023-08-08 NOTE — Progress Notes (Signed)
Patient completed first dose in office on 08/08/23. Tolerated well. Denies itchiness, irritation at injection site No swelling notes.  Will continue Actemra 162mg  SQ every 14 days in combination with Plaquenil 200mg  twice daily  Chesley Mires, PharmD, MPH, BCPS, CPP Clinical Pharmacist (Rheumatology and Pulmonology)

## 2023-08-12 ENCOUNTER — Telehealth: Payer: Self-pay | Admitting: Physical Medicine and Rehabilitation

## 2023-08-12 NOTE — Telephone Encounter (Signed)
Patient called and needs a appointment for lower back epidural shot. CB#[phone]

## 2023-08-13 ENCOUNTER — Ambulatory Visit: Payer: Managed Care, Other (non HMO) | Admitting: Physical Medicine and Rehabilitation

## 2023-08-13 ENCOUNTER — Encounter: Payer: Self-pay | Admitting: Physical Medicine and Rehabilitation

## 2023-08-13 VITALS — BP 136/81 | HR 97

## 2023-08-13 DIAGNOSIS — M5442 Lumbago with sciatica, left side: Secondary | ICD-10-CM | POA: Diagnosis not present

## 2023-08-13 DIAGNOSIS — M5416 Radiculopathy, lumbar region: Secondary | ICD-10-CM

## 2023-08-13 DIAGNOSIS — M7918 Myalgia, other site: Secondary | ICD-10-CM

## 2023-08-13 DIAGNOSIS — G8929 Other chronic pain: Secondary | ICD-10-CM

## 2023-08-13 NOTE — Progress Notes (Signed)
Functional Pain Scale - descriptive words and definitions  Unmanageable (7)  Pain interferes with normal ADL's/nothing seems to help/sleep is very difficult/active distractions are very difficult to concentrate on. Severe range order  Average Pain 7  MRI review, left leg is hurting and dragging.

## 2023-08-13 NOTE — Progress Notes (Signed)
Courtney Kramer - 62 y.o. female MRN [id number]  Date of birth: Aug 21, 1961  Office Visit Note: Visit Date: 08/13/2023 PCP: Erskine Emery, NP Referred by: Erskine Emery, NP  Subjective: Chief Complaint  Patient presents with   Left Leg - Pain   Lower Back - Pain   HPI: Courtney Kramer is a 62 y.o. female who comes in today for evaluation of chronic, worsening and severe left sided lower back pain radiating to buttock and down lateral leg to foot. Also reports localized tender area to left lower back. We have seen her in the past for more right sided symptoms. Left sided pain ongoing for several months, worsens with bending and walking. She describes pain as sore and aching, currently rates as 9 out of 10. States she feels she "drags" her left leg. Some relief of pain with home exercise regimen, rest and use medications. History of formal physical therapy, states these treatments increased her pain. Recent lumbar MRI imaging exhibits mild multi level spondylosis, previously seen right disc protrusion at L4-L5 has receded, There is also mild to moderate bilateral facet arthropathy at L5-S1, no high grade spinal canal stenosis noted. She has done well with intermittent right sided transforaminal epidural steroid injections over the years, however most recent injection (right L5 transforaminal) on 06/10/2023 only helped alleviate her pain for several weeks. Patient currently working as Interior and spatial designer, states pain is interfering with job duties. Patient denies focal weakness, numbness and tingling. No recent trauma or falls.      Review of Systems  Musculoskeletal:  Positive for back pain.  Neurological:  Negative for tingling, focal weakness and weakness.  All other systems reviewed and are negative.  Otherwise per HPI.  Assessment & Plan: Visit Diagnoses:    ICD-10-CM   1. Chronic left-sided low back pain with left-sided sciatica  M54.42    G89.29     2. Lumbar radiculopathy  M54.16      3. Myofascial pain syndrome  M79.18        Plan: Findings:  Chronic, worsening and severe left sided lower back pain radiating to buttock and down left lateral leg to foot. Patient continues to have severe pain despite good conservative therapies such as formal physical therapy, home exercise regimen, rest and use of medications. Patients clinical presentation and exam are complex, her symptoms do not directly correlate with recent lumbar MRI imaging. Her pain pattern fits with more L5 distribution. Her exam today is non focal, good strength noted to bilateral lower extremities, no foot drop noted. Given her significant relief of pain with transforaminal epidural steroid injections the next step is to perform diagnostic and hopefully therapeutic left L5 transforaminal epidural steroid injection under fluoroscopic guidance. I also feel there is a myofascial component contributing to her pain, there is tender palpable trigger point noted to left lumbar paraspinal region upon exam today. I feel she would benefit from re-grouping with physical therapy/chiropractor for possible dry needling. Patient does not wish to return to PT at this time as she does not feel her pain is primarily muscle related. We will get her for injection, should her pain persist would recommend she re-group with PT/chiropractic, could also look at medication management/chronic pain management. Do not see any surgical findings on recent lumbar MRI. No red flag symptoms noted upon exam today.     Meds & Orders: No orders of the defined types were placed in this encounter.  No orders of the defined types were placed  in this encounter.   Follow-up: Return for left L5 transforaminal epidural steroid injection.   Procedures: No procedures performed      Clinical History: No specialty comments available.   She reports that she has never smoked. She has never been exposed to tobacco smoke. She has never used smokeless tobacco. No  results for input(s): "HGBA1C", "LABURIC" in the last 8760 hours.  Objective:  VS:  HT:    WT:   BMI:     BP:136/81  HR:97bpm  TEMP: ( )  RESP:  Physical Exam Vitals and nursing note reviewed.  HENT:     Head: Normocephalic and atraumatic.     Right Ear: External ear normal.     Left Ear: External ear normal.     Nose: Nose normal.     Mouth/Throat:     Mouth: Mucous membranes are moist.  Eyes:     Extraocular Movements: Extraocular movements intact.  Cardiovascular:     Rate and Rhythm: Normal rate.     Pulses: Normal pulses.  Pulmonary:     Effort: Pulmonary effort is normal.  Abdominal:     General: Abdomen is flat. There is no distension.  Musculoskeletal:        General: Tenderness present.     Cervical back: Normal range of motion.     Comments: Patient rises from seated position to standing without difficulty. Good lumbar range of motion. No pain noted with facet loading. 5/5 strength noted with bilateral hip flexion, knee flexion/extension, ankle dorsiflexion/plantarflexion and EHL. No clonus noted bilaterally. No pain upon palpation of greater trochanters. No pain with internal/external rotation of bilateral hips. Sensation intact bilaterally. Tender palpable trigger point noted to left lumbar paraspinal region. Negative slump test bilaterally. Dysesthesias left L5 dermatome. Ambulates without aid, gait steady.     Skin:    General: Skin is warm and dry.     Capillary Refill: Capillary refill takes less than 2 seconds.  Neurological:     General: No focal deficit present.     Mental Status: She is alert and oriented to person, place, and time.  Psychiatric:        Mood and Affect: Mood normal.        Behavior: Behavior normal.     Ortho Exam  Imaging: No results found.  Past Medical/Family/Surgical/Social History: Medications & Allergies reviewed per EMR, new medications updated. Patient Active Problem List   Diagnosis Date Noted   Anxiety 06/11/2023    Depressive disorder 06/11/2023   Dysphagia 06/11/2023   Essential hypertension 06/11/2023   Rheumatoid arthritis involving multiple sites with positive rheumatoid factor (HCC) 04/21/2020   High risk medication use 04/21/2020   Primary osteoarthritis of both hands 04/21/2020   Primary osteoarthritis of both knees 04/21/2020   Primary osteoarthritis of both feet 04/21/2020   DDD (degenerative disc disease), lumbar 04/21/2020   Fracture, [place], right, closed 04/09/2018   Closed fracture of right distal radius and ulna, initial encounter 03/27/2018   Lumbar radiculopathy 09/19/2016   RENAL CALCULUS 05/04/2008   FLANK PAIN, RIGHT 05/04/2008   Past Medical History:  Diagnosis Date   Anxiety    Chronic back pain    Chronic back pain    GERD (gastroesophageal reflux disease)    History of cardiac murmur    07-26-2020  per pt happen after last pregnancy, work-up done was ok, and was told few years pcp no longer hears it   History of kidney stones    Kidney stones  OA (osteoarthritis)    hands , knees, feet   RA (rheumatoid arthritis) (HCC)    rheumotology--- Dr Kathie Rhodes. Deveshwar--- multiple sites seropositive/ positive rheumotoid factor,  taking plaquinel   Right ureteral calculus    Wears glasses    Family History  Problem Relation Age of Onset   Fibromyalgia Mother    Heart disease Mother    Macular degeneration Mother    Rheum arthritis Father    Rheum arthritis Sister    Cancer Brother    Breast cancer Neg Hx    Past Surgical History:  Procedure Laterality Date   COLONOSCOPY  2012   CYSTO/  RIGHT URETERAL STENT PLACEMENT  05-10-2008   CYSTOSCOPY W/ URETERAL STENT PLACEMENT Right 06/09/2014   Procedure: CYSTOSCOPY WITH RETROGRADE PYELOGRAM/URETERAL STENT PLACEMENT;  Surgeon: Valetta Fuller, MD;  Location[facility];  Service: Urology;  Laterality: Right;   CYSTOSCOPY W/ URETERAL STENT PLACEMENT Left 02/17/2020   Procedure: CYSTOSCOPY WITH RETROGRADE  PYELOGRAM/URETERAL STENT PLACEMENT;  Surgeon: Sebastian Ache, MD;  Location: [facility];  Service: Urology;  Laterality: Left;  45 MINS   CYSTOSCOPY/URETEROSCOPY/HOLMIUM LASER/STENT PLACEMENT Left 02/22/2020   Procedure: CYSTOSCOPY/LEFT URETEROSCOPY/HOLMIUM LASER/STENT EXCHANGE;  Surgeon: [name], MD;  Location: [facility];  Service: Urology;  Laterality: Left;   CYSTOSCOPY/URETEROSCOPY/HOLMIUM LASER/STENT PLACEMENT Right 07/28/2020   Procedure: CYSTOSCOPY RIGHT URETEROSCOPY RIGHT RETROGRADE PYELOGRAM HOLMIUM LASER/STENT PLACEMENT;  Surgeon: [name], MD;  Location: [facility];  Service: Urology;  Laterality: Right;   EXTRACORPOREAL SHOCK WAVE LITHOTRIPSY Right 05-10-2008   KNEE ARTHROSCOPY Left 2009   OPEN REDUCTION INTERNAL FIXATION (ORIF) DISTAL RADIAL FRACTURE Right 03/27/2018   Procedure: OPEN REDUCTION INTERNAL FIXATION (ORIF) RIGHT DISTAL RADIUS FRACTURE;  Surgeon: Tarry Kos, MD;  Location: [facility];  Service: Orthopedics;  Laterality: Right;   WISDOM TOOTH EXTRACTION  AGE 51   Social History   Occupational History   Not on file  Tobacco Use   Smoking status: Never    Passive exposure: Never   Smokeless tobacco: Never  Vaping Use   Vaping status: Never Used  Substance and Sexual Activity   Alcohol use: Yes    Comment: OCCASONAL   Drug use: Never   Sexual activity: Not Currently    Birth control/protection: Post-menopausal

## 2023-08-21 ENCOUNTER — Other Ambulatory Visit: Payer: Self-pay

## 2023-08-21 NOTE — Progress Notes (Signed)
Specialty Pharmacy Refill Coordination Note  Courtney Kramer is a 62 y.o. female contacted today regarding refills of specialty medication(s) Tocilizumab   Patient requested Delivery   Delivery date: 09/03/23   Verified address: [address], [place], [address]   Medication will be filled on 09/02/23.

## 2023-09-02 ENCOUNTER — Other Ambulatory Visit: Payer: Self-pay

## 2023-09-03 ENCOUNTER — Other Ambulatory Visit: Payer: Self-pay

## 2023-09-03 ENCOUNTER — Ambulatory Visit: Payer: Commercial Managed Care - HMO | Admitting: Physical Medicine and Rehabilitation

## 2023-09-03 VITALS — BP 179/82 | HR 76

## 2023-09-03 DIAGNOSIS — M5416 Radiculopathy, lumbar region: Secondary | ICD-10-CM | POA: Diagnosis not present

## 2023-09-03 MED ORDER — METHYLPREDNISOLONE ACETATE 40 MG/ML IJ SUSP
40.0000 mg | Freq: Once | INTRAMUSCULAR | Status: AC
  Administered 2023-09-03: 40 mg

## 2023-09-03 NOTE — Procedures (Signed)
Lumbosacral Transforaminal Epidural Steroid Injection - Sub-Pedicular Approach with Fluoroscopic Guidance  Patient: Courtney Kramer      Date of Birth: October 12, 1960 MRN: [id number] PCP: Erskine Emery, NP      Visit Date: 09/03/2023   Universal Protocol:    Date/Time: 09/03/2023  Consent Given By: the patient  Position: PRONE  Additional Comments: Vital signs were monitored before and after the procedure. Patient was prepped and draped in the usual sterile fashion. The correct patient, procedure, and site was verified.   Injection Procedure Details:   Procedure diagnoses: Lumbar radiculopathy [M54.16]    Meds Administered:  Meds ordered this encounter  Medications   methylPREDNISolone acetate (DEPO-MEDROL) injection 40 mg    Laterality: Left  Location/Site: L5  Needle:5.0 in., 22 ga.  Short bevel or Quincke spinal needle  Needle Placement: Transforaminal  Findings:    -Comments: Excellent flow of contrast along the nerve, nerve root and into the epidural space.  Procedure Details: After squaring off the end-plates to get a true AP view, the C-arm was positioned so that an oblique view of the foramen as noted above was visualized. The target area is just inferior to the "nose of the scotty dog" or sub pedicular. The soft tissues overlying this structure were infiltrated with 2-3 ml. of 1% Lidocaine without Epinephrine.  The spinal needle was inserted toward the target using a "trajectory" view along the fluoroscope beam.  Under AP and lateral visualization, the needle was advanced so it did not puncture dura and was located close the 6 O'Clock position of the pedical in AP tracterory. Biplanar projections were used to confirm position. Aspiration was confirmed to be negative for CSF and/or blood. A 1-2 ml. volume of Isovue-250 was injected and flow of contrast was noted at each level. Radiographs were obtained for documentation purposes.   After attaining the desired flow  of contrast documented above, a 0.5 to 1.0 ml test dose of 0.25% Marcaine was injected into each respective transforaminal space.  The patient was observed for 90 seconds post injection.  After no sensory deficits were reported, and normal lower extremity motor function was noted,   the above injectate was administered so that equal amounts of the injectate were placed at each foramen (level) into the transforaminal epidural space.   Additional Comments:  No complications occurred Dressing: 2 x 2 sterile gauze and Band-Aid    Post-procedure details: Patient was observed during the procedure. Post-procedure instructions were reviewed.  Patient left the clinic in stable condition.

## 2023-09-03 NOTE — Patient Instructions (Signed)

## 2023-09-03 NOTE — Progress Notes (Signed)
Functional Pain Scale - descriptive words and definitions   Severe (9)  Cannot do any ADL's even with assistance can barely talk/unable to sleep and unable to use distraction. Severe range order  Average Pain 9    +Driver, -BT, -Dye Allergies.  Pain in low back and in the left leg to the foot

## 2023-09-15 NOTE — Progress Notes (Signed)
Courtney Kramer - 62 y.o. female MRN [id number]  Date of birth: 10/21/1960  Office Visit Note: Visit Date: 09/03/2023 PCP: Erskine Emery, NP Referred by: Erskine Emery, NP  Subjective: Chief Complaint  Patient presents with   Lower Back - Pain   HPI:  Courtney Kramer is a 62 y.o. female who comes in today at the request of Ellin Goodie, FNP for planned Left L5-S1 Lumbar Transforaminal epidural steroid injection with fluoroscopic guidance.  The patient has failed conservative care including home exercise, medications, time and activity modification.  This injection will be diagnostic and hopefully therapeutic.  Please see requesting physician notes for further details and justification.   ROS Otherwise per HPI.  Assessment & Plan: Visit Diagnoses:    ICD-10-CM   1. Lumbar radiculopathy  M54.16 XR C-ARM NO REPORT    Epidural Steroid injection    methylPREDNISolone acetate (DEPO-MEDROL) injection 40 mg      Plan: No additional findings.   Meds & Orders:  Meds ordered this encounter  Medications   methylPREDNISolone acetate (DEPO-MEDROL) injection 40 mg    Orders Placed This Encounter  Procedures   XR C-ARM NO REPORT   Epidural Steroid injection    Follow-up: Return for visit to requesting provider as needed.   Procedures: No procedures performed  Lumbosacral Transforaminal Epidural Steroid Injection - Sub-Pedicular Approach with Fluoroscopic Guidance  Patient: Courtney Kramer      Date of Birth: 01/13/1961 MRN: [id number] PCP: Erskine Emery, NP      Visit Date: 09/03/2023   Universal Protocol:    Date/Time: 09/03/2023  Consent Given By: the patient  Position: PRONE  Additional Comments: Vital signs were monitored before and after the procedure. Patient was prepped and draped in the usual sterile fashion. The correct patient, procedure, and site was verified.   Injection Procedure Details:   Procedure diagnoses: Lumbar radiculopathy [M54.16]     Meds Administered:  Meds ordered this encounter  Medications   methylPREDNISolone acetate (DEPO-MEDROL) injection 40 mg    Laterality: Left  Location/Site: L5  Needle:5.0 in., 22 ga.  Short bevel or Quincke spinal needle  Needle Placement: Transforaminal  Findings:    -Comments: Excellent flow of contrast along the nerve, nerve root and into the epidural space.  Procedure Details: After squaring off the end-plates to get a true AP view, the C-arm was positioned so that an oblique view of the foramen as noted above was visualized. The target area is just inferior to the "nose of the scotty dog" or sub pedicular. The soft tissues overlying this structure were infiltrated with 2-3 ml. of 1% Lidocaine without Epinephrine.  The spinal needle was inserted toward the target using a "trajectory" view along the fluoroscope beam.  Under AP and lateral visualization, the needle was advanced so it did not puncture dura and was located close the 6 O'Clock position of the pedical in AP tracterory. Biplanar projections were used to confirm position. Aspiration was confirmed to be negative for CSF and/or blood. A 1-2 ml. volume of Isovue-250 was injected and flow of contrast was noted at each level. Radiographs were obtained for documentation purposes.   After attaining the desired flow of contrast documented above, a 0.5 to 1.0 ml test dose of 0.25% Marcaine was injected into each respective transforaminal space.  The patient was observed for 90 seconds post injection.  After no sensory deficits were reported, and normal lower extremity motor function was noted,   the above injectate  was administered so that equal amounts of the injectate were placed at each foramen (level) into the transforaminal epidural space.   Additional Comments:  No complications occurred Dressing: 2 x 2 sterile gauze and Band-Aid    Post-procedure details: Patient was observed during the procedure. Post-procedure  instructions were reviewed.  Patient left the clinic in stable condition.    Clinical History: CLINICAL DATA:  Low back pain, trauma   EXAM: MRI LUMBAR SPINE WITHOUT CONTRAST   TECHNIQUE: Multiplanar, multisequence MR imaging of the lumbar spine was performed. No intravenous contrast was administered.   COMPARISON:  08/20/2018   FINDINGS: Segmentation:  Standard.   Alignment:  Physiologic.   Vertebrae:  No fracture, evidence of discitis, or bone lesion.   Conus medullaris and cauda equina: Conus extends to the L1-2 level. Conus and cauda equina appear normal.   Paraspinal and other soft tissues: Left-sided IVC, an anatomic variant. No acute abnormality.   Disc levels:   T12-L1: Unremarkable.   L1-L2: Tiny shallow left paracentral disc protrusion. No foraminal or canal stenosis. Unchanged.   L2-L3: Mild annular disc bulge and mild bilateral facet hypertrophy. Impress upon the ventral thecal sac without significant canal stenosis. No foraminal stenosis. Unchanged.   L3-L4: Annular disc bulge with mild bilateral facet arthropathy. Impress upon the ventral thecal sac resulting in borderline-mild canal stenosis and bilateral subarticular recess stenosis. No significant foraminal stenosis. Minimal interval progression.   L4-L5: Mild annular disc bulge and mild bilateral facet arthropathy. Previously seen right-sided protrusion has receded. No canal stenosis. Mild right foraminal stenosis.   L5-S1: Minimal disc bulge with mild-to-moderate bilateral facet arthropathy. No foraminal or canal stenosis. Unchanged.   IMPRESSION: 1. Mild multilevel lumbar spondylosis, minimally progressed at L3-4 where there is borderline-mild canal stenosis and bilateral subarticular recess stenosis. 2. Mild right foraminal stenosis at L4-5.     Electronically Signed   By: Duanne Guess D.O.   On: 08/13/2023 09:10     Objective:  VS:  HT:    WT:   BMI:     BP:(!) 179/82   HR:76bpm  TEMP: ( )  RESP:  Physical Exam Vitals and nursing note reviewed.  Constitutional:      General: She is not in acute distress.    Appearance: Normal appearance. She is not ill-appearing.  HENT:     Head: Normocephalic and atraumatic.     Right Ear: External ear normal.     Left Ear: External ear normal.  Eyes:     Extraocular Movements: Extraocular movements intact.  Cardiovascular:     Rate and Rhythm: Normal rate.     Pulses: Normal pulses.  Pulmonary:     Effort: Pulmonary effort is normal. No respiratory distress.  Abdominal:     General: There is no distension.     Palpations: Abdomen is soft.  Musculoskeletal:        General: Tenderness present.     Cervical back: Neck supple.     Right lower leg: No edema.     Left lower leg: No edema.     Comments: Patient has good distal strength with no pain over the greater trochanters.  No clonus or focal weakness.  Skin:    Findings: No erythema, lesion or rash.  Neurological:     General: No focal deficit present.     Mental Status: She is alert and oriented to person, place, and time.     Sensory: No sensory deficit.     Motor: No weakness  or abnormal muscle tone.     Coordination: Coordination normal.  Psychiatric:        Mood and Affect: Mood normal.        Behavior: Behavior normal.      Imaging: No results found.

## 2023-09-23 NOTE — Progress Notes (Signed)
Office Visit Note  Patient: Courtney Kramer             Date of Birth: 1961/07/11           MRN: [id number]             PCP: Erskine Emery, NP Referring: Erskine Emery, NP Visit Date: 10/07/2023 Occupation: @GUAROCC @  Subjective:  Medication monitoring   History of Present Illness: Courtney Kramer is a 63 y.o. female with history of seropositive rheumatoid arthritis and osteoarthritis. Patient remains on  Plaquenil 200 mg 1 tablet by mouth twice daily. Patient was started on actemra on 08/08/23.  She is currently on actemra 162 mg sq injections every 14 days.  She is tolerating Actemra without any side effects or injection site reactions.  She has not had any interruptions in therapy.  No recent or recurrent infections.  Patient has noticed about a 50% improvement in her symptoms since initiating Actemra.  She denies any increased joint pain or joint swelling at this time.  Patient reports that her lower back pain and symptoms of radiculopathy have improved significantly since having an epidural injection performed on 09/03/2023. She denies any new medical conditions.    Activities of Daily Living:  Patient reports morning stiffness for 30 minutes.   Patient Denies nocturnal pain.  Difficulty dressing/grooming: Denies Difficulty climbing stairs: Reports Difficulty getting out of chair: Reports Difficulty using hands for taps, buttons, cutlery, and/or writing: Denies  Review of Systems  Constitutional:  Positive for fatigue.  HENT:  Negative for mouth sores, mouth dryness and nose dryness.   Eyes:  Positive for dryness. Negative for pain.  Respiratory:  Positive for shortness of breath. Negative for cough and wheezing.   Cardiovascular:  Negative for chest pain and palpitations.  Gastrointestinal:  Negative for blood in stool, constipation and diarrhea.  Endocrine: Negative for increased urination.  Genitourinary:  Negative for involuntary urination.  Musculoskeletal:   Positive for joint pain, joint pain, joint swelling, myalgias, muscle weakness, morning stiffness, muscle tenderness and myalgias. Negative for gait problem.  Skin:  Negative for color change, rash, hair loss and sensitivity to sunlight.  Allergic/Immunologic: Negative for susceptible to infections.  Neurological:  Negative for dizziness and headaches.  Hematological:  Negative for swollen glands.  Psychiatric/Behavioral:  Positive for depressed mood. Negative for sleep disturbance. The patient is nervous/anxious.     PMFS History:  Patient Active Problem List   Diagnosis Date Noted   Anxiety 06/11/2023   Depressive disorder 06/11/2023   Dysphagia 06/11/2023   Essential hypertension 06/11/2023   Rheumatoid arthritis involving multiple sites with positive rheumatoid factor (HCC) 04/21/2020   High risk medication use 04/21/2020   Primary osteoarthritis of both hands 04/21/2020   Primary osteoarthritis of both knees 04/21/2020   Primary osteoarthritis of both feet 04/21/2020   DDD (degenerative disc disease), lumbar 04/21/2020   Fracture, Colles, right, closed 04/09/2018   Closed fracture of right distal radius and ulna, initial encounter 03/27/2018   Lumbar radiculopathy 09/19/2016   RENAL CALCULUS 05/04/2008   FLANK PAIN, RIGHT 05/04/2008    Past Medical History:  Diagnosis Date   Anxiety    Chronic back pain    Chronic back pain    GERD (gastroesophageal reflux disease)    History of cardiac murmur    07-26-2020  per pt happen after last pregnancy, work-up done was ok, and was told few years pcp no longer hears it   History of kidney stones  Kidney stones    OA (osteoarthritis)    hands , knees, feet   RA (rheumatoid arthritis) (HCC)    rheumotology--- Dr Kathie Rhodes. Deveshwar--- multiple sites seropositive/ positive rheumotoid factor,  taking plaquinel   Right ureteral calculus    Wears glasses     Family History  Problem Relation Age of Onset   Fibromyalgia Mother    Heart  disease Mother    Macular degeneration Mother    Rheum arthritis Father    Rheum arthritis Sister    Cancer Brother    Breast cancer Neg Hx    Past Surgical History:  Procedure Laterality Date   COLONOSCOPY  2012   CYSTO/  RIGHT URETERAL STENT PLACEMENT  05-10-2008   CYSTOSCOPY W/ URETERAL STENT PLACEMENT Right 06/09/2014   Procedure: CYSTOSCOPY WITH RETROGRADE PYELOGRAM/URETERAL STENT PLACEMENT;  Surgeon: Valetta Fuller, MD;  Location: [facility];  Service: Urology;  Laterality: Right;   CYSTOSCOPY W/ URETERAL STENT PLACEMENT Left 02/17/2020   Procedure: CYSTOSCOPY WITH RETROGRADE PYELOGRAM/URETERAL STENT PLACEMENT;  Surgeon: Sebastian Ache, MD;  Location: [facility];  Service: Urology;  Laterality: Left;  45 MINS   CYSTOSCOPY/URETEROSCOPY/HOLMIUM LASER/STENT PLACEMENT Left 02/22/2020   Procedure: CYSTOSCOPY/LEFT URETEROSCOPY/HOLMIUM LASER/STENT EXCHANGE;  Surgeon: [name], MD;  Location: [facility];  Service: Urology;  Laterality: Left;   CYSTOSCOPY/URETEROSCOPY/HOLMIUM LASER/STENT PLACEMENT Right 07/28/2020   Procedure: CYSTOSCOPY RIGHT URETEROSCOPY RIGHT RETROGRADE PYELOGRAM HOLMIUM LASER/STENT PLACEMENT;  Surgeon: [name], MD;  Location: [facility];  Service: Urology;  Laterality: Right;   EXTRACORPOREAL SHOCK WAVE LITHOTRIPSY Right 05-10-2008   KNEE ARTHROSCOPY Left 2009   OPEN REDUCTION INTERNAL FIXATION (ORIF) DISTAL RADIAL FRACTURE Right 03/27/2018   Procedure: OPEN REDUCTION INTERNAL FIXATION (ORIF) RIGHT DISTAL RADIUS FRACTURE;  Surgeon: Tarry Kos, MD;  Location: [facility];  Service: Orthopedics;  Laterality: Right;   WISDOM TOOTH EXTRACTION  AGE 72   Social History   Social History Narrative   Not on file   Immunization History  Administered Date(s) Administered   Influenza Inj Mdck Quad With Preservative 08/02/2019   Influenza,inj,Quad PF,6+ Mos 06/30/2018     Objective: Vital Signs: BP (!) 163/83 (BP Location: Right  Arm, Patient Position: Sitting, Cuff Size: Normal)   Pulse 80   Resp 15   Ht 5\' 6"  (1.676 m)   Wt 201 lb 6.4 oz (91.4 kg)   BMI 32.51 kg/m    Physical Exam Vitals and nursing note reviewed.  Constitutional:      Appearance: She is well-developed.  HENT:     Head: Normocephalic and atraumatic.  Eyes:     Conjunctiva/sclera: Conjunctivae normal.  Cardiovascular:     Rate and Rhythm: Normal rate and regular rhythm.     Heart sounds: Normal heart sounds.  Pulmonary:     Effort: Pulmonary effort is normal.     Breath sounds: Normal breath sounds.  Abdominal:     General: Bowel sounds are normal.     Palpations: Abdomen is soft.  Musculoskeletal:     Cervical back: Normal range of motion.  Lymphadenopathy:     Cervical: No cervical adenopathy.  Skin:    General: Skin is warm and dry.     Capillary Refill: Capillary refill takes less than 2 seconds.  Neurological:     Mental Status: She is alert and oriented to person, place, and time.  Psychiatric:        Behavior: Behavior normal.      Musculoskeletal Exam: C-spine, thoracic spine,  lumbar spine and good range of motion.  Shoulder joints, elbow joints, wrist joints, MCPs, PIPs, DIPs have good range of motion with no synovitis.  Thickening and mild tenderness of bilateral third PIP joints but no synovitis was noted.  Hip joints have good range of motion with no groin pain.  Knee joints have good range of motion with some warmth and crepitus in the left knee.  Ankle joints have good ROM with no tenderness or joint swelling.   CDAI Exam: CDAI Score: -- Patient Global: --; Provider Global: -- Swollen: --; Tender: -- Joint Exam 10/07/2023   No joint exam has been documented for this visit   There is currently no information documented on the homunculus. Go to the Rheumatology activity and complete the homunculus joint exam.  Investigation: No additional findings.  Imaging: No results found.   Recent Labs: Lab Results   Component Value Date   WBC 5.5 07/29/2023   HGB 14.7 07/29/2023   PLT 299 07/29/2023   NA 142 07/29/2023   K 3.9 07/29/2023   CL 106 07/29/2023   CO2 28 07/29/2023   GLUCOSE 64 (L) 07/29/2023   BUN 16 07/29/2023   CREATININE 0.83 07/29/2023   BILITOT 0.4 07/29/2023   AST 19 07/29/2023   ALT 15 07/29/2023   PROT 6.5 07/29/2023   CALCIUM 9.2 07/29/2023   GFRAA 87 03/15/2021   QFTBGOLDPLUS NEGATIVE 02/28/2023    Speciality Comments: PLQ eye exam: 07/18/2023 WNL. [facility] f/u 1 year.   Arava- no reponse dcd after 4 months Enbrel 04/02/22 Patient does not want to take methotrexate as she believes it caused toxicity to her father.  Procedures:  No procedures performed Allergies: Sulfa antibiotics   Assessment / Plan:     Visit Diagnoses: Rheumatoid arthritis involving multiple sites with positive rheumatoid factor (HCC) -She has no synovitis on examination today.  She has noticed about a 50% improvement in arthralgias and joint stiffness since initiating Actemra on 08/08/2023.  She is currently on Actemra 162 mg sq injections once every 14 days and Plaquenil 200 mg 1 tablet by mouth twice daily.  She is tolerating combination therapy without any side effects or injection site reactions from Actemra.  She has not had any recent or recurrent infections.  No recent gaps in therapy.  Patient will remain on combination therapy as prescribed and we will reassess for the full efficacy in 3 months.   Plan: Lipid panel  High risk medication use - Plaquenil 200 mg 1 tablet by mouth twice daily and Actemra 162 mg sq injections every 14 days.  Actemra 162 mg sq injections every 14 days--started 08/08/23.   CBC and CMP updated on 07/29/23. Orders for CBC and CMP released today.  Lipid panel updated on 07/29/23.  Future order for lipid panel placed today.  According to the patient she has had a recent lipid panel obtained by her PCP so we will call to obtain these records. TB gold negative  on 02/28/23.  No recent or recurrent infections. Discussed the importance of holding actemra if she develops signs or symptoms of an infection and to resume once the infection has completely cleared.   - Plan: CBC with Differential/Platelet, COMPLETE METABOLIC PANEL WITH GFR, Lipid panel  Pseudogout: No recent flares.   Primary osteoarthritis of both hands: PIP and DIP thickening consistent with osteoarthritis of both hands.  Prominence most severe involving bilateral third PIP joints.  No active synovitis noted.  Effusion, right knee: No recurrence.  No effusion noted today.  Primary osteoarthritis of both knees: She has good range of motion of both knee joints with some warmth and crepitus in the left knee.  She has noticed about a 50% improvement in her knee joint pain and stiffness since initiating Actemra.  Primary osteoarthritis of both feet: She is not experiencing any increased discomfort in her feet at this time.  She is good range of motion of both ankle joints with no tenderness.  Degeneration of intervertebral disc of lumbar region without discogenic back pain or lower extremity pain - Under care of Dr. Alvester Morin.  Patient underwent an epidural injection on 06/10/2023  Other medical conditions are listed as follows:  Other fatigue  Closed Colles' fracture of right radius with routine healing, subsequent encounter  RENAL CALCULUS  Essential hypertension: Blood pressure was 163/83.  Patient was advised to monitor blood pressure closely to reach out to PCP if her blood pressure remains elevated.  Family history of rheumatoid arthritis  Lipid screening -Future order for lipid panel placed today.  Plan: Lipid panel  Orders: Orders Placed This Encounter  Procedures   CBC with Differential/Platelet   COMPLETE METABOLIC PANEL WITH GFR   Lipid panel   No orders of the defined types were placed in this encounter.    Follow-Up Instructions: Return in about 3 months (around  01/05/2024) for Rheumatoid arthritis, Osteoarthritis.   Gearldine Bienenstock, PA-C  Note - This record has been created using Dragon software.  Chart creation errors have been sought, but may not always  have been located. Such creation errors do not reflect on  the standard of medical care.

## 2023-09-24 ENCOUNTER — Other Ambulatory Visit: Payer: Self-pay

## 2023-09-27 ENCOUNTER — Other Ambulatory Visit: Payer: Self-pay

## 2023-09-30 ENCOUNTER — Other Ambulatory Visit: Payer: Self-pay

## 2023-09-30 NOTE — Progress Notes (Signed)
 Specialty Pharmacy Refill Coordination Note  Courtney Kramer is a 63 y.o. female contacted today regarding refills of specialty medication(s) Tocilizumab  (Actemra  ACTPen)   Patient requested Delivery   Delivery date: 10/03/23   Verified address: [address]   [place] [id number]   Medication will be filled on 10/02/23.

## 2023-10-02 ENCOUNTER — Other Ambulatory Visit (HOSPITAL_COMMUNITY): Payer: Self-pay

## 2023-10-02 ENCOUNTER — Telehealth: Payer: Self-pay

## 2023-10-02 ENCOUNTER — Other Ambulatory Visit: Payer: Self-pay

## 2023-10-02 NOTE — Telephone Encounter (Signed)
 Received notification from [facility] pharmacy that patient appears to have new insurance and requires an authorization for their medication.  Submitted a Prior Authorization request to [facility] for ACTEMRA  SQ via CoverMyMeds. Authorization has been APPROVED from 10/02/2023 to 10/01/2024. Approval letter sent to scan center.   CMM KEY: [id number] Authorization#: [id number]

## 2023-10-05 ENCOUNTER — Other Ambulatory Visit: Payer: Self-pay | Admitting: Physician Assistant

## 2023-10-05 DIAGNOSIS — M0579 Rheumatoid arthritis with rheumatoid factor of multiple sites without organ or systems involvement: Secondary | ICD-10-CM

## 2023-10-07 ENCOUNTER — Encounter: Payer: Self-pay | Admitting: Physician Assistant

## 2023-10-07 ENCOUNTER — Ambulatory Visit: Payer: Medicaid Other | Attending: Physician Assistant | Admitting: Physician Assistant

## 2023-10-07 VITALS — BP 163/83 | HR 80 | Resp 15 | Ht 66.0 in | Wt 201.4 lb

## 2023-10-07 DIAGNOSIS — M25461 Effusion, right knee: Secondary | ICD-10-CM

## 2023-10-07 DIAGNOSIS — M19071 Primary osteoarthritis, right ankle and foot: Secondary | ICD-10-CM

## 2023-10-07 DIAGNOSIS — M19041 Primary osteoarthritis, right hand: Secondary | ICD-10-CM

## 2023-10-07 DIAGNOSIS — N200 Calculus of kidney: Secondary | ICD-10-CM | POA: Diagnosis not present

## 2023-10-07 DIAGNOSIS — I10 Essential (primary) hypertension: Secondary | ICD-10-CM | POA: Diagnosis not present

## 2023-10-07 DIAGNOSIS — Z79899 Other long term (current) drug therapy: Secondary | ICD-10-CM | POA: Diagnosis not present

## 2023-10-07 DIAGNOSIS — M170 Bilateral primary osteoarthritis of knee: Secondary | ICD-10-CM

## 2023-10-07 DIAGNOSIS — M19042 Primary osteoarthritis, left hand: Secondary | ICD-10-CM

## 2023-10-07 DIAGNOSIS — Z13220 Encounter for screening for lipoid disorders: Secondary | ICD-10-CM

## 2023-10-07 DIAGNOSIS — M1120 Other chondrocalcinosis, unspecified site: Secondary | ICD-10-CM

## 2023-10-07 DIAGNOSIS — M51369 Other intervertebral disc degeneration, lumbar region without mention of lumbar back pain or lower extremity pain: Secondary | ICD-10-CM

## 2023-10-07 DIAGNOSIS — S52531D Colles' fracture of right radius, subsequent encounter for closed fracture with routine healing: Secondary | ICD-10-CM

## 2023-10-07 DIAGNOSIS — M0579 Rheumatoid arthritis with rheumatoid factor of multiple sites without organ or systems involvement: Secondary | ICD-10-CM | POA: Diagnosis not present

## 2023-10-07 DIAGNOSIS — R5383 Other fatigue: Secondary | ICD-10-CM

## 2023-10-07 DIAGNOSIS — M19072 Primary osteoarthritis, left ankle and foot: Secondary | ICD-10-CM

## 2023-10-07 DIAGNOSIS — Z8261 Family history of arthritis: Secondary | ICD-10-CM

## 2023-10-07 NOTE — Telephone Encounter (Signed)
Last Fill: 07/16/2023  Eye exam: 07/18/2023 WNL.   Labs: 07/29/2023 CBC and CMP WNL   Next Visit: 01/13/2024  Last Visit: 10/07/2023  [id number] arthritis involving multiple sites with positive rheumatoid factor   Current Dose per office note 07/29/2023:  Plaquenil 200 mg 1 tablet by mouth twice daily.   Okay to refill Plaquenil?

## 2023-10-08 LAB — CBC WITH DIFFERENTIAL/PLATELET
Absolute Lymphocytes: 1720 {cells}/uL (ref 850–3900)
Absolute Monocytes: 468 {cells}/uL (ref 200–950)
Basophils Absolute: 68 {cells}/uL (ref 0–200)
Basophils Relative: 1.7 %
Eosinophils Absolute: 220 {cells}/uL (ref 15–500)
Eosinophils Relative: 5.5 %
HCT: 42.9 % (ref 35.0–45.0)
Hemoglobin: 14.4 g/dL (ref 11.7–15.5)
MCH: 31.2 pg (ref 27.0–33.0)
MCHC: 33.6 g/dL (ref 32.0–36.0)
MCV: 92.9 fL (ref 80.0–100.0)
MPV: 10.8 fL (ref 7.5–12.5)
Monocytes Relative: 11.7 %
Neutro Abs: 1524 {cells}/uL (ref 1500–7800)
Neutrophils Relative %: 38.1 %
Platelets: 262 10*3/uL (ref 140–400)
RBC: 4.62 10*6/uL (ref 3.80–5.10)
RDW: 11.4 % (ref 11.0–15.0)
Total Lymphocyte: 43 %
WBC: 4 10*3/uL (ref 3.8–10.8)

## 2023-10-08 LAB — COMPLETE METABOLIC PANEL WITH GFR
AG Ratio: 1.9 (calc) (ref 1.0–2.5)
ALT: 16 U/L (ref 6–29)
AST: 20 U/L (ref 10–35)
Albumin: 4.2 g/dL (ref 3.6–5.1)
Alkaline phosphatase (APISO): 90 U/L (ref 37–153)
BUN: 14 mg/dL (ref 7–25)
CO2: 30 mmol/L (ref 20–32)
Calcium: 9.3 mg/dL (ref 8.6–10.4)
Chloride: 108 mmol/L (ref 98–110)
Creat: 0.81 mg/dL (ref 0.50–1.05)
Globulin: 2.2 g/dL (ref 1.9–3.7)
Glucose, Bld: 70 mg/dL (ref 65–99)
Potassium: 4 mmol/L (ref 3.5–5.3)
Sodium: 143 mmol/L (ref 135–146)
Total Bilirubin: 0.6 mg/dL (ref 0.2–1.2)
Total Protein: 6.4 g/dL (ref 6.1–8.1)
eGFR: 82 mL/min/{1.73_m2} (ref >=60)

## 2023-10-08 NOTE — Progress Notes (Signed)
CBC and CMP WNL

## 2023-10-18 ENCOUNTER — Other Ambulatory Visit: Payer: Self-pay

## 2023-10-22 ENCOUNTER — Other Ambulatory Visit: Payer: Self-pay

## 2023-10-22 ENCOUNTER — Other Ambulatory Visit: Payer: Self-pay | Admitting: Physician Assistant

## 2023-10-22 DIAGNOSIS — M0579 Rheumatoid arthritis with rheumatoid factor of multiple sites without organ or systems involvement: Secondary | ICD-10-CM

## 2023-10-22 DIAGNOSIS — Z79899 Other long term (current) drug therapy: Secondary | ICD-10-CM

## 2023-10-22 MED ORDER — ACTEMRA ACTPEN 162 MG/0.9ML ~~LOC~~ SOAJ
162.0000 mg | SUBCUTANEOUS | 2 refills | Status: DC
  Filled 2023-10-22: qty 1.8, 28d supply, fill #0
  Filled 2023-11-25: qty 1.8, 28d supply, fill #1
  Filled 2024-01-01: qty 1.8, 28d supply, fill #2

## 2023-10-22 NOTE — Telephone Encounter (Signed)
 Last Fill: 08/08/2023  Labs: 10/07/2023 CBC and CMP WNL   TB Gold: 02/28/2023 Neg    Next Visit: 12/24/2023  Last Visit: 10/07/2023  IK:Myzlfjunpi arthritis involving multiple sites with positive rheumatoid factor   Current Dose per office note 10/07/2023: Actemra  162 mg sq injections every 14 days.   Okay to refill Actemra ?

## 2023-10-22 NOTE — Progress Notes (Signed)
 Specialty Pharmacy Refill Coordination Note  Courtney Kramer is a 63 y.o. female contacted today regarding refills of specialty medication(s) Tocilizumab  (Actemra  ACTPen)   Patient requested Delivery   Delivery date: 10/29/23   Verified address: [address]   [place] [address]   Medication will be filled on 10/28/23.   Pending refill request  This fill date is pending response to refill request from provider. Patient is aware and if they have not received fill by intended date, they must follow up with pharmacy.

## 2023-10-28 ENCOUNTER — Other Ambulatory Visit: Payer: Self-pay

## 2023-10-31 DIAGNOSIS — F419 Anxiety disorder, unspecified: Secondary | ICD-10-CM | POA: Diagnosis not present

## 2023-10-31 DIAGNOSIS — E785 Hyperlipidemia, unspecified: Secondary | ICD-10-CM | POA: Diagnosis not present

## 2023-10-31 DIAGNOSIS — M25561 Pain in right knee: Secondary | ICD-10-CM | POA: Diagnosis not present

## 2023-10-31 DIAGNOSIS — M545 Low back pain, unspecified: Secondary | ICD-10-CM | POA: Diagnosis not present

## 2023-10-31 DIAGNOSIS — I10 Essential (primary) hypertension: Secondary | ICD-10-CM | POA: Diagnosis not present

## 2023-11-01 LAB — LAB REPORT - SCANNED: EGFR: 79.4

## 2023-11-06 ENCOUNTER — Telehealth: Payer: Self-pay | Admitting: *Deleted

## 2023-11-06 NOTE — Telephone Encounter (Signed)
 Labs received from:[facility]   Drawn on:11/01/2023  Reviewed by: Sherron Ales, PA-C  Labs drawn: CMP, CBC  Results: AST 37   RDW 11.9   Neut % 38.00   BASO% 2.47   NEUT # 1.75   BASO # 0.11  Patient is on Actemra 162 mg SQ every 14 days and PLQ 200 mg po BID   Per Ladona Ridgel, avoid Tylenol and alcohol use.  Attempted to contact the patient and unable to leave message, voicemail full.

## 2023-11-07 NOTE — Telephone Encounter (Signed)
 I called patient, patient verbalized understanding, patient rarely uses Tylenol and alcohol.

## 2023-11-14 ENCOUNTER — Other Ambulatory Visit: Payer: Self-pay

## 2023-11-20 ENCOUNTER — Other Ambulatory Visit (HOSPITAL_COMMUNITY): Payer: Self-pay

## 2023-11-25 ENCOUNTER — Other Ambulatory Visit: Payer: Self-pay

## 2023-11-25 NOTE — Progress Notes (Signed)
 Specialty Pharmacy Refill Coordination Note  Courtney Kramer is a 63 y.o. female contacted today regarding refills of specialty medication(s) Tocilizumab (Actemra ACTPen)   Patient requested Delivery   Delivery date: 12/04/23   Verified address: [address]   [place] [address]   Medication will be filled on 03.18.25.

## 2023-12-03 ENCOUNTER — Other Ambulatory Visit: Payer: Self-pay

## 2023-12-05 DIAGNOSIS — E66812 Obesity, class 2: Secondary | ICD-10-CM | POA: Diagnosis not present

## 2023-12-05 DIAGNOSIS — R8290 Unspecified abnormal findings in urine: Secondary | ICD-10-CM | POA: Diagnosis not present

## 2023-12-05 DIAGNOSIS — Z6831 Body mass index (BMI) 31.0-31.9, adult: Secondary | ICD-10-CM | POA: Diagnosis not present

## 2023-12-25 ENCOUNTER — Other Ambulatory Visit: Payer: Self-pay

## 2023-12-30 ENCOUNTER — Other Ambulatory Visit: Payer: Self-pay

## 2023-12-30 NOTE — Progress Notes (Signed)
 Office Visit Note  Patient: Courtney Kramer             Date of Birth: 12/10/1960           MRN: [id number]             PCP: Marce Sensing, NP Referring: Marce Sensing, NP Visit Date: 01/13/2024 Occupation: @GUAROCC @  Subjective:  Pain in both knees   History of Present Illness: Courtney Kramer is a 63 y.o. female with history of seropositive rheumatoid arthritis and osteoarthritis.  Patient remains on  Plaquenil  200 mg 1 tablet by mouth twice daily and Actemra  162 mg sq injections every 14 days.  She is tolerating combination therapy without any side effects.  She has not had any recent gaps in therapy.  She continues to experience pain and stiffness involving both knees but denies any joint swelling.  She rates the pain in both knees 4/10 on a daily basis.  She has intermittent stiffness in both hands which she attributes to weather changes. She is taking Celebrex 200 mg 1 capsule daily for pain relief.   Activities of Daily Living:  Patient reports morning stiffness for 30 minutes.   Patient Denies nocturnal pain.  Difficulty dressing/grooming: Denies Difficulty climbing stairs: Reports Difficulty getting out of chair: Denies Difficulty using hands for taps, buttons, cutlery, and/or writing: Denies  Review of Systems  Constitutional:  Positive for fatigue.  HENT:  Negative for mouth sores, mouth dryness and nose dryness.   Eyes:  Negative for pain and dryness.  Respiratory:  Negative for shortness of breath and difficulty breathing.   Cardiovascular:  Negative for chest pain and palpitations.  Gastrointestinal:  Negative for blood in stool, constipation and diarrhea.  Endocrine: Negative for increased urination.  Genitourinary:  Negative for involuntary urination.  Musculoskeletal:  Positive for joint pain, gait problem, joint pain, myalgias, muscle weakness, morning stiffness, muscle tenderness and myalgias. Negative for joint swelling.  Skin:  Positive for sensitivity  to sunlight. Negative for color change, rash and hair loss.  Allergic/Immunologic: Negative for susceptible to infections.  Neurological:  Negative for dizziness and headaches.  Hematological:  Negative for swollen glands.  Psychiatric/Behavioral:  Positive for depressed mood. Negative for sleep disturbance. The patient is nervous/anxious.     PMFS History:  Patient Active Problem List   Diagnosis Date Noted   Anxiety 06/11/2023   Depressive disorder 06/11/2023   Dysphagia 06/11/2023   Essential hypertension 06/11/2023   Rheumatoid arthritis involving multiple sites with positive rheumatoid factor (HCC) 04/21/2020   High risk medication use 04/21/2020   Primary osteoarthritis of both hands 04/21/2020   Primary osteoarthritis of both knees 04/21/2020   Primary osteoarthritis of both feet 04/21/2020   DDD (degenerative disc disease), lumbar 04/21/2020   Fracture, Colles, right, closed 04/09/2018   Closed fracture of right distal radius and ulna, initial encounter 03/27/2018   Lumbar radiculopathy 09/19/2016   RENAL CALCULUS 05/04/2008   FLANK PAIN, RIGHT 05/04/2008    Past Medical History:  Diagnosis Date   Anxiety    Chronic back pain    Chronic back pain    GERD (gastroesophageal reflux disease)    History of cardiac murmur    07-26-2020  per pt happen after last pregnancy, work-up done was ok, and was told few years pcp no longer hears it   History of kidney stones    Kidney stones    OA (osteoarthritis)    hands , knees, feet   RA (  rheumatoid arthritis) [facility])    rheumotology--- Dr Laneta Pintos. Deveshwar--- multiple sites seropositive/ positive rheumotoid factor,  taking plaquinel   Right ureteral calculus    Wears glasses     Family History  Problem Relation Age of Onset   Fibromyalgia Mother    Heart disease Mother    Macular degeneration Mother    Rheum arthritis Father    Rheum arthritis Sister    Cancer Brother    Breast cancer Neg Hx    Past Surgical History:   Procedure Laterality Date   COLONOSCOPY  2012   CYSTO/  RIGHT URETERAL STENT PLACEMENT  05-10-2008   CYSTOSCOPY W/ URETERAL STENT PLACEMENT Right 06/09/2014   Procedure: CYSTOSCOPY WITH RETROGRADE PYELOGRAM/URETERAL STENT PLACEMENT;  Surgeon: Livingston Rigg, MD;  Location[facility];  Service: Urology;  Laterality: Right;   CYSTOSCOPY W/ URETERAL STENT PLACEMENT Left 02/17/2020   Procedure: CYSTOSCOPY WITH RETROGRADE PYELOGRAM/URETERAL STENT PLACEMENT;  Surgeon: Osborn Blaze, MD;  Location: [facility];  Service: Urology;  Laterality: Left;  45 MINS   CYSTOSCOPY/URETEROSCOPY/HOLMIUM LASER/STENT PLACEMENT Left 02/22/2020   Procedure: CYSTOSCOPY/LEFT URETEROSCOPY/HOLMIUM LASER/STENT EXCHANGE;  Surgeon: Homero Luster, MD;  Location: [facility];  Service: Urology;  Laterality: Left;   CYSTOSCOPY/URETEROSCOPY/HOLMIUM LASER/STENT PLACEMENT Right 07/28/2020   Procedure: CYSTOSCOPY RIGHT URETEROSCOPY RIGHT RETROGRADE PYELOGRAM HOLMIUM LASER/STENT PLACEMENT;  Surgeon: Homero Luster, MD;  Location: [facility];  Service: Urology;  Laterality: Right;   EXTRACORPOREAL SHOCK WAVE LITHOTRIPSY Right 05-10-2008   KNEE ARTHROSCOPY Left 2009   OPEN REDUCTION INTERNAL FIXATION (ORIF) DISTAL RADIAL FRACTURE Right 03/27/2018   Procedure: OPEN REDUCTION INTERNAL FIXATION (ORIF) RIGHT DISTAL RADIUS FRACTURE;  Surgeon: Wes Hamman, MD;  Location: [facility];  Service: Orthopedics;  Laterality: Right;   WISDOM TOOTH EXTRACTION  AGE 44   Social History   Social History Narrative   Not on file   Immunization History  Administered Date(s) Administered   Influenza Inj Mdck Quad With Preservative 08/02/2019   Influenza,inj,Quad PF,6+ Mos 06/30/2018     Objective: Vital Signs: BP (!) 170/95 (BP Location: Left Arm, Patient Position: Sitting, Cuff Size: Normal)   Pulse 78   Resp 15   Ht 5\' 6"  (1.676 m)   Wt 190 lb 9.6 oz (86.5 kg)   BMI 30.76 kg/m    Physical Exam Vitals and  nursing note reviewed.  Constitutional:      Appearance: She is well-developed.  HENT:     Head: Normocephalic and atraumatic.  Eyes:     Conjunctiva/sclera: Conjunctivae normal.  Cardiovascular:     Rate and Rhythm: Normal rate and regular rhythm.     Heart sounds: Normal heart sounds.  Pulmonary:     Effort: Pulmonary effort is normal.     Breath sounds: Normal breath sounds.  Abdominal:     General: Bowel sounds are normal.     Palpations: Abdomen is soft.  Musculoskeletal:     Cervical back: Normal range of motion.  Lymphadenopathy:     Cervical: No cervical adenopathy.  Skin:    General: Skin is warm and dry.     Capillary Refill: Capillary refill takes less than 2 seconds.  Neurological:     Mental Status: She is alert and oriented to person, place, and time.  Psychiatric:        Behavior: Behavior normal.      Musculoskeletal Exam: C-spine, thoracic spine, and lumbar spine good ROM.  Shoulder joints have good ROM with mild discomfort with forward flexion.  Elbow joints, wrist joints, MCPs, PIPs, and DIPs good ROM with no synovitis. PIP and DIP thickening.  Tenderness of bilateral 3rd PIP joints but no synovitis noted.  Hip joints have good ROM with no groin pain.  Knee joints have good ROM with discomfort bilaterally.  Mild warmth and crepitus in the left knee noted.  No effusion noted.  Ankle joints have good ROM with no tenderness or joint swelling.   CDAI Exam: CDAI Score: -- Patient Global: --; Provider Global: -- Swollen: --; Tender: -- Joint Exam 01/13/2024   No joint exam has been documented for this visit   There is currently no information documented on the homunculus. Go to the Rheumatology activity and complete the homunculus joint exam.  Investigation: No additional findings.  Imaging: No results found.  Recent Labs: Lab Results  Component Value Date   WBC 4.0 10/07/2023   HGB 14.4 10/07/2023   PLT 262 10/07/2023   NA 143 10/07/2023   K 4.0  10/07/2023   CL 108 10/07/2023   CO2 30 10/07/2023   GLUCOSE 70 10/07/2023   BUN 14 10/07/2023   CREATININE 0.81 10/07/2023   BILITOT 0.6 10/07/2023   AST 20 10/07/2023   ALT 16 10/07/2023   PROT 6.4 10/07/2023   CALCIUM  9.3 10/07/2023   GFRAA 87 03/15/2021   QFTBGOLDPLUS NEGATIVE 02/28/2023    Speciality Comments: PLQ eye exam: 07/18/2023 WNL. [facility] f[facility]u 1 year.   Arava - no reponse dcd after 4 months Enbrel  04/02/22 Patient does not want to take methotrexate as she believes it caused toxicity to her father.  Procedures:  No procedures performed Allergies: Sulfa antibiotics   Assessment / Plan:     Visit Diagnoses: Rheumatoid arthritis involving multiple sites with positive rheumatoid factor (HCC) - She has no synovitis on examination today.  She has not had any signs or symptoms of a rheumatoid arthritis flare.  She has clinically been doing well taking Plaquenil  200 mg 1 tablet by mouth twice daily and Actemra  162 mg sq injections every 14 days.  She is tolerating combination therapy without any side effects and has not had any recent gaps in therapy.  She continues to experience pain and stiffness involving both knees due to underlying osteoarthritis.  Mild warmth and crepitus noted in the left knee today.  She has been taking Celebrex 200 mg 1 capsule by mouth daily for pain relief.  Overall she has noticed an improvement in her symptoms from rheumatoid arthritis into the current treatment regimen.  No medication changes will be made at this time.  She was advised to notify us  if she develops signs or symptoms of a flare.  She will follow-up in the office in 5 months or sooner if needed.  Plan: hydroxychloroquine  (PLAQUENIL ) 200 MG tablet, Tocilizumab  (ACTEMRA  ACTPEN) 162 MG/0.9ML SOAJ  High risk medication use - Plaquenil  200 mg 1 tablet by mouth twice daily and Actemra  162 mg sq injections every 14 days. Actemra  162 mg sq injections every 14 days--started 08/08/23.  CBC  and CMP updated on 10/30/22. Orders for CBC and CMP released today.  TB gold negative on 02/28/23.  Actemra  162 mg sq injections every 14 days--started 08/08/23.   Discussed the importance of holding actemra  if she develops signs or symptoms of an infection and to resume once the infection has completely cleared.  PLQ eye exam: 07/18/2023 WNL. [facility] f/u 1 year.  - Plan: Tocilizumab  (ACTEMRA  ACTPEN) 162 MG/0.9ML SOAJ, CBC with Differential/Platelet, Comprehensive metabolic  panel with GFR  Pseudogout: No signs or symptoms of a flare.  She takes celebrex 200 mg 1 capsule daily for pain relief.   Primary osteoarthritis of both hands: She has PIP and DIP thickening consistent with osteoarthritis of both hands.  Tenderness at bilateral third PIP joints noted.  No synovitis on exam.  Effusion, right knee: No effusion noted.   Primary osteoarthritis of both knees: Patient continues to have chronic pain and stiffness involving both knees.  She has mild warmth and crepitus in the left knee but no effusion noted.  The discomfort in her knee joints has been a 4 out of 10 most days.  She has been working on weight loss since starting on Wegovy.  Discussed the importance of lower extremity muscle strengthening.  Primary osteoarthritis of both feet: She is not experiencing any increased discomfort in her feet at this time.  Good ROM of both ankles but no tenderness joint swelling noted.   Degeneration of intervertebral disc of lumbar region without discogenic back pain or lower extremity pain - Under care of Dr. Daisey Dryer.  Patient underwent an epidural injection on 06/10/2023.  Doing well.    Other medical conditions are listed as follows:   Other fatigue  Closed Colles' fracture of right radius with routine healing, subsequent encounter  RENAL CALCULUS  Essential hypertension: BP is elevated today in the office and was rechecked prior to leaving.  Patient was advised to monitor blood pressure and to  reach out to PCP if her BP remains elevated.   Family history of rheumatoid arthritis    Orders: Orders Placed This Encounter  Procedures   CBC with Differential/Platelet   Comprehensive metabolic panel with GFR   Meds ordered this encounter  Medications   hydroxychloroquine  (PLAQUENIL ) 200 MG tablet    Sig: Take 1 tablet (200 mg total) by mouth 2 (two) times daily.    Dispense:  60 tablet    Refill:  2   Tocilizumab  (ACTEMRA  ACTPEN) 162 MG/0.9ML SOAJ    Sig: Inject 162 mg into the skin every 14 (fourteen) days.    Dispense:  1.8 mL    Refill:  2     Follow-Up Instructions: Return in about 5 months (around 06/14/2024) for Rheumatoid arthritis, Osteoarthritis.   Romayne Clubs, PA-C  Note - This record has been created using Dragon software.  Chart creation errors have been sought, but may not always  have been located. Such creation errors do not reflect on  the standard of medical care.

## 2024-01-01 ENCOUNTER — Other Ambulatory Visit: Payer: Self-pay

## 2024-01-01 NOTE — Progress Notes (Signed)
 Specialty Pharmacy Refill Coordination Note  Courtney Kramer is a 63 y.o. female contacted today regarding refills of specialty medication(s) Tocilizumab (Actemra ACTPen)   Patient requested Delivery   Delivery date: 01/09/24   Verified address: [address]   [place] [address]   Medication will be filled on 04.23.25.

## 2024-01-08 ENCOUNTER — Other Ambulatory Visit: Payer: Self-pay

## 2024-01-09 ENCOUNTER — Other Ambulatory Visit: Payer: Self-pay

## 2024-01-13 ENCOUNTER — Ambulatory Visit: Payer: Medicaid Other | Attending: Physician Assistant | Admitting: Physician Assistant

## 2024-01-13 ENCOUNTER — Other Ambulatory Visit: Payer: Self-pay

## 2024-01-13 ENCOUNTER — Encounter: Payer: Self-pay | Admitting: Physician Assistant

## 2024-01-13 VITALS — BP 176/81 | HR 72 | Resp 16 | Ht 66.0 in | Wt 190.6 lb

## 2024-01-13 DIAGNOSIS — M19042 Primary osteoarthritis, left hand: Secondary | ICD-10-CM | POA: Diagnosis not present

## 2024-01-13 DIAGNOSIS — S52531D Colles' fracture of right radius, subsequent encounter for closed fracture with routine healing: Secondary | ICD-10-CM | POA: Diagnosis not present

## 2024-01-13 DIAGNOSIS — M1120 Other chondrocalcinosis, unspecified site: Secondary | ICD-10-CM | POA: Diagnosis not present

## 2024-01-13 DIAGNOSIS — M51369 Other intervertebral disc degeneration, lumbar region without mention of lumbar back pain or lower extremity pain: Secondary | ICD-10-CM | POA: Insufficient documentation

## 2024-01-13 DIAGNOSIS — M0579 Rheumatoid arthritis with rheumatoid factor of multiple sites without organ or systems involvement: Secondary | ICD-10-CM | POA: Insufficient documentation

## 2024-01-13 DIAGNOSIS — M25461 Effusion, right knee: Secondary | ICD-10-CM | POA: Insufficient documentation

## 2024-01-13 DIAGNOSIS — Z8261 Family history of arthritis: Secondary | ICD-10-CM | POA: Insufficient documentation

## 2024-01-13 DIAGNOSIS — I10 Essential (primary) hypertension: Secondary | ICD-10-CM | POA: Diagnosis not present

## 2024-01-13 DIAGNOSIS — M19071 Primary osteoarthritis, right ankle and foot: Secondary | ICD-10-CM | POA: Diagnosis not present

## 2024-01-13 DIAGNOSIS — N200 Calculus of kidney: Secondary | ICD-10-CM | POA: Insufficient documentation

## 2024-01-13 DIAGNOSIS — R5383 Other fatigue: Secondary | ICD-10-CM | POA: Insufficient documentation

## 2024-01-13 DIAGNOSIS — Z79899 Other long term (current) drug therapy: Secondary | ICD-10-CM | POA: Insufficient documentation

## 2024-01-13 DIAGNOSIS — M19072 Primary osteoarthritis, left ankle and foot: Secondary | ICD-10-CM | POA: Diagnosis not present

## 2024-01-13 DIAGNOSIS — M170 Bilateral primary osteoarthritis of knee: Secondary | ICD-10-CM | POA: Diagnosis not present

## 2024-01-13 DIAGNOSIS — M19041 Primary osteoarthritis, right hand: Secondary | ICD-10-CM | POA: Insufficient documentation

## 2024-01-13 MED ORDER — ACTEMRA ACTPEN 162 MG/0.9ML ~~LOC~~ SOAJ
162.0000 mg | SUBCUTANEOUS | 2 refills | Status: DC
Start: 2024-01-13 — End: 2024-04-27
  Filled 2024-01-13 – 2024-02-03 (×2): qty 1.8, 28d supply, fill #0
  Filled 2024-03-02: qty 1.8, 28d supply, fill #1
  Filled 2024-04-03: qty 1.8, 28d supply, fill #2

## 2024-01-13 MED ORDER — HYDROXYCHLOROQUINE SULFATE 200 MG PO TABS: 200.0000 mg | ORAL_TABLET | Freq: Two times a day (BID) | ORAL | 2 refills | Status: DC

## 2024-01-14 LAB — CBC WITH DIFFERENTIAL/PLATELET
Absolute Lymphocytes: 1918 {cells}/uL (ref 850–3900)
Absolute Monocytes: 475 {cells}/uL (ref 200–950)
Basophils Absolute: 62 {cells}/uL (ref 0–200)
Basophils Relative: 1.4 %
Eosinophils Absolute: 163 {cells}/uL (ref 15–500)
Eosinophils Relative: 3.7 %
HCT: 44.7 % (ref 35.0–45.0)
Hemoglobin: 15 g/dL (ref 11.7–15.5)
MCH: 31.1 pg (ref 27.0–33.0)
MCHC: 33.6 g/dL (ref 32.0–36.0)
MCV: 92.5 fL (ref 80.0–100.0)
MPV: 10.5 fL (ref 7.5–12.5)
Monocytes Relative: 10.8 %
Neutro Abs: 1782 {cells}/uL (ref 1500–7800)
Neutrophils Relative %: 40.5 %
Platelets: 245 10*3/uL (ref 140–400)
RBC: 4.83 10*6/uL (ref 3.80–5.10)
RDW: 11.4 % (ref 11.0–15.0)
Total Lymphocyte: 43.6 %
WBC: 4.4 10*3/uL (ref 3.8–10.8)

## 2024-01-14 LAB — COMPREHENSIVE METABOLIC PANEL WITH GFR
AG Ratio: 2.1 (calc) (ref 1.0–2.5)
ALT: 18 U/L (ref 6–29)
AST: 25 U/L (ref 10–35)
Albumin: 4.7 g/dL (ref 3.6–5.1)
Alkaline phosphatase (APISO): 78 U/L (ref 37–153)
BUN: 13 mg/dL (ref 7–25)
CO2: 31 mmol/L (ref 20–32)
Calcium: 9.7 mg/dL (ref 8.6–10.4)
Chloride: 105 mmol/L (ref 98–110)
Creat: 0.82 mg/dL (ref 0.50–1.05)
Globulin: 2.2 g/dL (ref 1.9–3.7)
Glucose, Bld: 81 mg/dL (ref 65–99)
Potassium: 4.1 mmol/L (ref 3.5–5.3)
Sodium: 141 mmol/L (ref 135–146)
Total Bilirubin: 1.2 mg/dL (ref 0.2–1.2)
Total Protein: 6.9 g/dL (ref 6.1–8.1)
eGFR: 81 mL/min/{1.73_m2} (ref >=60)

## 2024-01-14 NOTE — Progress Notes (Signed)
 CBC and CMP WNL

## 2024-01-29 ENCOUNTER — Ambulatory Visit: Payer: Managed Care, Other (non HMO) | Admitting: Rheumatology

## 2024-01-30 ENCOUNTER — Other Ambulatory Visit: Payer: Self-pay

## 2024-01-30 DIAGNOSIS — I10 Essential (primary) hypertension: Secondary | ICD-10-CM | POA: Diagnosis not present

## 2024-01-30 DIAGNOSIS — Z6830 Body mass index (BMI) 30.0-30.9, adult: Secondary | ICD-10-CM | POA: Diagnosis not present

## 2024-01-30 DIAGNOSIS — E669 Obesity, unspecified: Secondary | ICD-10-CM | POA: Diagnosis not present

## 2024-02-03 ENCOUNTER — Other Ambulatory Visit: Payer: Self-pay

## 2024-02-03 NOTE — Progress Notes (Signed)
 Specialty Pharmacy Refill Coordination Note  Courtney Kramer is a 63 y.o. female contacted today regarding refills of specialty medication(s) Tocilizumab  (Actemra  ACTPen)   Patient requested Delivery   Delivery date: 02/05/24   Verified address: [address]   [place] [address]   Medication will be filled on 02/04/24.

## 2024-02-03 NOTE — Progress Notes (Signed)
 Specialty Pharmacy Ongoing Clinical Assessment Note  Courtney Kramer is a 63 y.o. female who is being followed by the specialty pharmacy service for RxSp Rheumatoid Arthritis   Patient's specialty medication(s) reviewed today: Tocilizumab  (Actemra  ACTPen)   Missed doses in the last 4 weeks: 0   Patient/Caregiver did not have any additional questions or concerns.   Therapeutic benefit summary: Patient is achieving benefit   Adverse events/side effects summary: No adverse events/side effects   Patient's therapy is appropriate to: Continue    Goals Addressed             This Visit's Progress    Maintain optimal adherence to therapy   On track    Patient is on track. Patient will maintain adherence and adhere to provider and/or lab appointments      Minimize recurrence of flares   On track    Patient is on track. Patient will maintain adherence and be monitored by provider to determine if a change in treatment plan is warranted. Patient stated that she feeling great and medication is keeping pain under control.         Follow up: 6 months  [facility]

## 2024-02-03 NOTE — Progress Notes (Signed)
 Clinical Intervention Note  Clinical Intervention Notes: Patient started Ozempic a couple weeks ago, No DDIs identified with Actemra    Clinical Intervention Outcomes: Prevention of an adverse drug event   Advertising account planner

## 2024-02-04 ENCOUNTER — Other Ambulatory Visit: Payer: Self-pay

## 2024-02-19 DIAGNOSIS — K219 Gastro-esophageal reflux disease without esophagitis: Secondary | ICD-10-CM | POA: Diagnosis not present

## 2024-02-19 DIAGNOSIS — N390 Urinary tract infection, site not specified: Secondary | ICD-10-CM | POA: Diagnosis not present

## 2024-02-19 DIAGNOSIS — Z0000 Encounter for general adult medical examination without abnormal findings: Secondary | ICD-10-CM | POA: Diagnosis not present

## 2024-02-19 DIAGNOSIS — Z760 Encounter for issue of repeat prescription: Secondary | ICD-10-CM | POA: Diagnosis not present

## 2024-02-19 DIAGNOSIS — Z124 Encounter for screening for malignant neoplasm of cervix: Secondary | ICD-10-CM | POA: Diagnosis not present

## 2024-02-19 DIAGNOSIS — Z6831 Body mass index (BMI) 31.0-31.9, adult: Secondary | ICD-10-CM | POA: Diagnosis not present

## 2024-03-02 ENCOUNTER — Other Ambulatory Visit: Payer: Self-pay

## 2024-03-04 ENCOUNTER — Other Ambulatory Visit: Payer: Self-pay

## 2024-03-04 ENCOUNTER — Other Ambulatory Visit: Payer: Self-pay | Admitting: Pharmacy Technician

## 2024-03-04 NOTE — Progress Notes (Signed)
 Specialty Pharmacy Refill Coordination Note  Courtney Kramer is a 63 y.o. female contacted today regarding refills of specialty medication(s) Tocilizumab  (Actemra  ACTPen)   Patient requested Delivery   Delivery date: 03/11/24   Verified address: [address] [place]   Medication will be filled on 03/10/24.

## 2024-03-10 ENCOUNTER — Other Ambulatory Visit: Payer: Self-pay

## 2024-03-19 ENCOUNTER — Telehealth: Payer: Self-pay | Admitting: Physical Medicine and Rehabilitation

## 2024-03-19 NOTE — Telephone Encounter (Signed)
 Pt called requesting a call to set an appt with [facility]. Please call pt at [phone].

## 2024-03-23 DIAGNOSIS — Z9181 History of falling: Secondary | ICD-10-CM | POA: Diagnosis not present

## 2024-03-23 DIAGNOSIS — S0990XA Unspecified injury of head, initial encounter: Secondary | ICD-10-CM | POA: Diagnosis not present

## 2024-03-23 DIAGNOSIS — M79604 Pain in right leg: Secondary | ICD-10-CM | POA: Diagnosis not present

## 2024-03-23 DIAGNOSIS — M5431 Sciatica, right side: Secondary | ICD-10-CM | POA: Diagnosis not present

## 2024-03-23 DIAGNOSIS — M545 Low back pain, unspecified: Secondary | ICD-10-CM | POA: Diagnosis not present

## 2024-04-02 DIAGNOSIS — M5441 Lumbago with sciatica, right side: Secondary | ICD-10-CM | POA: Diagnosis not present

## 2024-04-02 DIAGNOSIS — I10 Essential (primary) hypertension: Secondary | ICD-10-CM | POA: Diagnosis not present

## 2024-04-02 DIAGNOSIS — Z6830 Body mass index (BMI) 30.0-30.9, adult: Secondary | ICD-10-CM | POA: Diagnosis not present

## 2024-04-02 DIAGNOSIS — E782 Mixed hyperlipidemia: Secondary | ICD-10-CM | POA: Diagnosis not present

## 2024-04-02 DIAGNOSIS — K219 Gastro-esophageal reflux disease without esophagitis: Secondary | ICD-10-CM | POA: Diagnosis not present

## 2024-04-02 DIAGNOSIS — E669 Obesity, unspecified: Secondary | ICD-10-CM | POA: Diagnosis not present

## 2024-04-02 DIAGNOSIS — E559 Vitamin D deficiency, unspecified: Secondary | ICD-10-CM | POA: Diagnosis not present

## 2024-04-02 DIAGNOSIS — M069 Rheumatoid arthritis, unspecified: Secondary | ICD-10-CM | POA: Diagnosis not present

## 2024-04-03 ENCOUNTER — Other Ambulatory Visit: Payer: Self-pay

## 2024-04-03 ENCOUNTER — Other Ambulatory Visit: Payer: Self-pay | Admitting: Pharmacy Technician

## 2024-04-03 ENCOUNTER — Other Ambulatory Visit (HOSPITAL_COMMUNITY): Payer: Self-pay

## 2024-04-03 NOTE — Progress Notes (Signed)
 Specialty Pharmacy Refill Coordination Note  Courtney Kramer is a 62 y.o. female contacted today regarding refills of specialty medication(s) Tocilizumab  (Actemra  ACTPen)   Patient requested Delivery   Delivery date: 04/07/24   Verified address: [address]  [place]   Medication will be filled on 04/06/24.

## 2024-04-06 ENCOUNTER — Other Ambulatory Visit: Payer: Self-pay

## 2024-04-08 DIAGNOSIS — G5703 Lesion of sciatic nerve, bilateral lower limbs: Secondary | ICD-10-CM | POA: Diagnosis not present

## 2024-04-08 DIAGNOSIS — M545 Low back pain, unspecified: Secondary | ICD-10-CM | POA: Diagnosis not present

## 2024-04-08 DIAGNOSIS — R202 Paresthesia of skin: Secondary | ICD-10-CM | POA: Diagnosis not present

## 2024-04-10 ENCOUNTER — Ambulatory Visit: Admitting: Physical Medicine and Rehabilitation

## 2024-04-17 ENCOUNTER — Encounter: Payer: Self-pay | Admitting: Physical Medicine and Rehabilitation

## 2024-04-17 ENCOUNTER — Ambulatory Visit (INDEPENDENT_AMBULATORY_CARE_PROVIDER_SITE_OTHER): Admitting: Physical Medicine and Rehabilitation

## 2024-04-17 DIAGNOSIS — M5416 Radiculopathy, lumbar region: Secondary | ICD-10-CM | POA: Diagnosis not present

## 2024-04-17 DIAGNOSIS — M5441 Lumbago with sciatica, right side: Secondary | ICD-10-CM | POA: Diagnosis not present

## 2024-04-17 DIAGNOSIS — G8929 Other chronic pain: Secondary | ICD-10-CM | POA: Diagnosis not present

## 2024-04-17 DIAGNOSIS — R202 Paresthesia of skin: Secondary | ICD-10-CM | POA: Diagnosis not present

## 2024-04-17 DIAGNOSIS — M7918 Myalgia, other site: Secondary | ICD-10-CM

## 2024-04-17 NOTE — Progress Notes (Signed)
 Pain Scale   Average Pain 3 Patient advising she has lower back pain radiating to right hip, that increases when walking and decreases when sitting.        +Driver, -BT, -Dye Allergies.

## 2024-04-17 NOTE — Progress Notes (Signed)
 Courtney Kramer - 63 y.o. female MRN [id number]  Date of birth: [birth year]  Office Visit Note: Visit Date: 04/17/2024 PCP: Silvano Angeline FALCON, NP Referred by: Silvano Angeline FALCON, NP  Subjective: Chief Complaint  Patient presents with   Lower Back - Pain   HPI: Courtney Kramer is a 63 y.o. female who comes in today for evaluation of acute on chronic right sided lower back and right leg pain/numbness. Her pain started about 3 weeks ago. Believes her pain started after walking on the beach. Her pain radiates from left foot up lateral leg to right lower back. Also reports numbness/tingling to left foot and leg. Her pain worsens with standing and activity. She describes pain as numbness and feels like she is dragging her right foot, currently rates as 5 out of 10. Some relief of pain with home exercise regimen, rest and use of medications. Her PCP recently prescribed short course of oral Prednisone  with some relief of pain. Lumbar MRI imaging from 2024 shows mild multi level spondylosis, previously seen right disc protrusion at L4-L5 has receded, There is also mild to moderate bilateral facet arthropathy at L5-S1, no high grade spinal canal stenosis noted. She has undergone multiple transforaminal epidural steroid injections in our office with significant relief of pain. Most recent was left L5 transforaminal epidural steroid injection on 09/03/2023. Patient denies recent trauma or falls.   Recent bilateral lower extremity nerve studies show moderately increased polyphasic potentials. Also reports bilateral lower extremity lumbosacral radiculopathic changes with particular attention being directed to the L4 and L5 and nerve roots on the right and L5 level on the left.        Review of Systems  Musculoskeletal:  Positive for back pain.  Neurological:  Positive for tingling and weakness.  All other systems reviewed and are negative.  Otherwise per HPI.  Assessment & Plan: Visit Diagnoses: No  diagnosis found.   Plan: Findings:  Acute on chronic right sided lower back and right leg pain/numbness. Patient continues to have severe pain despite good conservative therapies such as home exercise regimen, rest and use of medications. Patients clinical presentation and exam are complex, her pain does seem to radiate up the leg from the foot, although is more of L5 pattern. Her pain does not seem to directly correlate with lumbar MRI imaging from 2024. Good relief of pain with previous transforaminal epidural steroid injections. Her exam today is non focal, good strength noted to bilateral lower extremities. No foot drop noted when walking. We discussed treatment plan in detail today. Next step is to perform right L5 transforaminal epidural steroid injection under fluoroscopic guidance. If good relief of pain with injection we can repeat this procedure infrequently as needed. Should paresthesias to legs continue would consider speaking with her PCP regarding referral to neurology. She has no questions at this time. No red flag symptoms noted upon exam today.     Meds & Orders: No orders of the defined types were placed in this encounter.  No orders of the defined types were placed in this encounter.   Follow-up: No follow-ups on file.   Procedures: No procedures performed      Clinical History: CLINICAL DATA:  Low back pain, trauma   EXAM: MRI LUMBAR SPINE WITHOUT CONTRAST   TECHNIQUE: Multiplanar, multisequence MR imaging of the lumbar spine was performed. No intravenous contrast was administered.   COMPARISON:  08/20/2018   FINDINGS: Segmentation:  Standard.   Alignment:  Physiologic.   Vertebrae:  No fracture, evidence of discitis, or bone lesion.   Conus medullaris and cauda equina: Conus extends to the L1-2 level. Conus and cauda equina appear normal.   Paraspinal and other soft tissues: Left-sided IVC, an anatomic variant. No acute abnormality.   Disc levels:    T12-L1: Unremarkable.   L1-L2: Tiny shallow left paracentral disc protrusion. No foraminal or canal stenosis. Unchanged.   L2-L3: Mild annular disc bulge and mild bilateral facet hypertrophy. Impress upon the ventral thecal sac without significant canal stenosis. No foraminal stenosis. Unchanged.   L3-L4: Annular disc bulge with mild bilateral facet arthropathy. Impress upon the ventral thecal sac resulting in borderline-mild canal stenosis and bilateral subarticular recess stenosis. No significant foraminal stenosis. Minimal interval progression.   L4-L5: Mild annular disc bulge and mild bilateral facet arthropathy. Previously seen right-sided protrusion has receded. No canal stenosis. Mild right foraminal stenosis.   L5-S1: Minimal disc bulge with mild-to-moderate bilateral facet arthropathy. No foraminal or canal stenosis. Unchanged.   IMPRESSION: 1. Mild multilevel lumbar spondylosis, minimally progressed at L3-4 where there is borderline-mild canal stenosis and bilateral subarticular recess stenosis. 2. Mild right foraminal stenosis at L4-5.     Electronically Signed   By: Mabel Converse D.O.   On: 08/13/2023 09:10   She reports that she has never smoked. She has never been exposed to tobacco smoke. She has never used smokeless tobacco. No results for input(s): HGBA1C, LABURIC in the last 8760 hours.  Objective:  VS:  HT:    WT:   BMI:     BP:   HR: bpm  TEMP: ( )  RESP:  Physical Exam Vitals and nursing note reviewed.  HENT:     Head: Normocephalic and atraumatic.     Right Ear: External ear normal.     Left Ear: External ear normal.     Nose: Nose normal.     Mouth/Throat:     Mouth: Mucous membranes are moist.  Eyes:     Extraocular Movements: Extraocular movements intact.  Cardiovascular:     Rate and Rhythm: Normal rate.     Pulses: Normal pulses.  Pulmonary:     Effort: Pulmonary effort is normal.  Abdominal:     General: Abdomen is flat.  There is no distension.  Musculoskeletal:        General: Tenderness present.     Cervical back: Normal range of motion.     Comments: Patient rises from seated position to standing without difficulty. Good lumbar range of motion. No pain noted with facet loading. 5/5 strength noted with bilateral hip flexion, knee flexion/extension, ankle dorsiflexion/plantarflexion and EHL. No clonus noted bilaterally. No pain upon palpation of greater trochanters. No pain with internal/external rotation of bilateral hips. Sensation intact bilaterally. Dysesthesias noted to right L5 dermatome. Negative slump test bilaterally. Ambulates without aid, gait steady.     Skin:    General: Skin is warm and dry.     Capillary Refill: Capillary refill takes less than 2 seconds.  Neurological:     General: No focal deficit present.     Mental Status: She is alert and oriented to person, place, and time.  Psychiatric:        Mood and Affect: Mood normal.        Behavior: Behavior normal.     Ortho Exam  Imaging: No results found.  Past Medical/Family/Surgical/Social History: Medications & Allergies reviewed per EMR, new medications updated. Patient Active Problem List   Diagnosis Date Noted  Anxiety 06/11/2023   Depressive disorder 06/11/2023   Dysphagia 06/11/2023   Essential hypertension 06/11/2023   Rheumatoid arthritis involving multiple sites with positive rheumatoid factor (HCC) 04/21/2020   High risk medication use 04/21/2020   Primary osteoarthritis of both hands 04/21/2020   Primary osteoarthritis of both knees 04/21/2020   Primary osteoarthritis of both feet 04/21/2020   DDD (degenerative disc disease), lumbar 04/21/2020   Fracture, [place], right, closed 04/09/2018   Closed fracture of right distal radius and ulna, initial encounter 03/27/2018   Lumbar radiculopathy 09/19/2016   RENAL CALCULUS 05/04/2008   FLANK PAIN, RIGHT 05/04/2008   Past Medical History:  Diagnosis Date   Anxiety     Chronic back pain    Chronic back pain    GERD (gastroesophageal reflux disease)    History of cardiac murmur    07-26-2020  per pt happen after last pregnancy, work-up done was ok, and was told few years pcp no longer hears it   History of kidney stones    Kidney stones    OA (osteoarthritis)    hands , knees, feet   RA (rheumatoid arthritis) (HCC)    rheumotology--- Dr GORMAN. Deveshwar--- multiple sites seropositive/ positive rheumotoid factor,  taking plaquinel   Right ureteral calculus    Wears glasses    Family History  Problem Relation Age of Onset   Fibromyalgia Mother    Heart disease Mother    Macular degeneration Mother    Rheum arthritis Father    Rheum arthritis Sister    Cancer Brother    Breast cancer Neg Hx    Past Surgical History:  Procedure Laterality Date   COLONOSCOPY  2012   CYSTO/  RIGHT URETERAL STENT PLACEMENT  05-10-2008   CYSTOSCOPY W/ URETERAL STENT PLACEMENT Right 06/09/2014   Procedure: CYSTOSCOPY WITH RETROGRADE PYELOGRAM/URETERAL STENT PLACEMENT;  Surgeon: Alm GORMAN Fragmin, MD;  Location: [facility]  Service: Urology;  Laterality: Right;   CYSTOSCOPY W/ URETERAL STENT PLACEMENT Left 02/17/2020   Procedure: CYSTOSCOPY WITH RETROGRADE PYELOGRAM/URETERAL STENT PLACEMENT;  Surgeon: Alvaro Hummer, MD;  Location: [facility];  Service: Urology;  Laterality: Left;  45 MINS   CYSTOSCOPY/URETEROSCOPY/HOLMIUM LASER/STENT PLACEMENT Left 02/22/2020   Procedure: CYSTOSCOPY/LEFT URETEROSCOPY/HOLMIUM LASER/STENT EXCHANGE;  Surgeon: Watt Rush, MD;  Location: [facility];  Service: Urology;  Laterality: Left;   CYSTOSCOPY/URETEROSCOPY/HOLMIUM LASER/STENT PLACEMENT Right 07/28/2020   Procedure: CYSTOSCOPY RIGHT URETEROSCOPY RIGHT RETROGRADE PYELOGRAM HOLMIUM LASER/STENT PLACEMENT;  Surgeon: Watt Rush, MD;  Location: [facility];  Service: Urology;  Laterality: Right;   EXTRACORPOREAL SHOCK WAVE LITHOTRIPSY Right 05-10-2008   KNEE ARTHROSCOPY Left  2009   OPEN REDUCTION INTERNAL FIXATION (ORIF) DISTAL RADIAL FRACTURE Right 03/27/2018   Procedure: OPEN REDUCTION INTERNAL FIXATION (ORIF) RIGHT DISTAL RADIUS FRACTURE;  Surgeon: Jerri Kay HERO, MD;  Location: [facility];  Service: Orthopedics;  Laterality: Right;   WISDOM TOOTH EXTRACTION  AGE 13   Social History   Occupational History   Not on file  Tobacco Use   Smoking status: Never    Passive exposure: Never   Smokeless tobacco: Never  Vaping Use   Vaping status: Never Used  Substance and Sexual Activity   Alcohol use: Yes    Comment: OCCASONAL   Drug use: Never   Sexual activity: Not Currently    Birth control/protection: Post-menopausal

## 2024-04-27 ENCOUNTER — Other Ambulatory Visit: Payer: Self-pay | Admitting: Physician Assistant

## 2024-04-27 ENCOUNTER — Other Ambulatory Visit: Payer: Self-pay | Admitting: Pharmacy Technician

## 2024-04-27 ENCOUNTER — Other Ambulatory Visit: Payer: Self-pay

## 2024-04-27 DIAGNOSIS — Z9225 Personal history of immunosupression therapy: Secondary | ICD-10-CM

## 2024-04-27 DIAGNOSIS — Z79899 Other long term (current) drug therapy: Secondary | ICD-10-CM

## 2024-04-27 DIAGNOSIS — M0579 Rheumatoid arthritis with rheumatoid factor of multiple sites without organ or systems involvement: Secondary | ICD-10-CM

## 2024-04-27 DIAGNOSIS — Z111 Encounter for screening for respiratory tuberculosis: Secondary | ICD-10-CM

## 2024-04-27 MED ORDER — ACTEMRA ACTPEN 162 MG/0.9ML ~~LOC~~ SOAJ
162.0000 mg | SUBCUTANEOUS | 0 refills | Status: DC
  Filled 2024-04-27: qty 1.8, 28d supply, fill #0

## 2024-04-27 NOTE — Progress Notes (Signed)
 Specialty Pharmacy Refill Coordination Note  Courtney Kramer is a 63 y.o. female contacted today regarding refills of specialty medication(s) Tocilizumab  (Actemra  ACTPen)   Patient requested Delivery   Delivery date: 05/06/24   Verified address: [address] [place]   Medication will be filled on 05/05/24.  This fill date is pending response to refill request from provider. Patient is aware and if they have not received fill by intended date they must follow up with pharmacy.

## 2024-04-27 NOTE — Telephone Encounter (Signed)
 Last Fill: 01/13/2024  Labs: 01/13/2024 CBC and CMP WNL   TB Gold: 02/28/2023 Neg    Next Visit: 06/15/2024  Last Visit: 01/13/2024  DX: Rheumatoid arthritis involving multiple sites with positive rheumatoid factor   Current Dose per office note 01/13/2024: Actemra  162 mg sq injections every 14 days.   Patient advised she is due to update lab work. Patient states she will try to update them this week.   Okay to refill Actemra ?

## 2024-05-05 ENCOUNTER — Other Ambulatory Visit: Payer: Self-pay

## 2024-05-11 ENCOUNTER — Other Ambulatory Visit: Payer: Self-pay | Admitting: *Deleted

## 2024-05-11 DIAGNOSIS — Z9225 Personal history of immunosupression therapy: Secondary | ICD-10-CM

## 2024-05-11 DIAGNOSIS — Z111 Encounter for screening for respiratory tuberculosis: Secondary | ICD-10-CM

## 2024-05-11 DIAGNOSIS — Z79899 Other long term (current) drug therapy: Secondary | ICD-10-CM

## 2024-05-12 ENCOUNTER — Ambulatory Visit: Payer: Self-pay | Admitting: Physician Assistant

## 2024-05-12 NOTE — Progress Notes (Signed)
 CBC and CMP WNL

## 2024-05-13 DIAGNOSIS — Z6830 Body mass index (BMI) 30.0-30.9, adult: Secondary | ICD-10-CM | POA: Diagnosis not present

## 2024-05-13 DIAGNOSIS — M069 Rheumatoid arthritis, unspecified: Secondary | ICD-10-CM | POA: Diagnosis not present

## 2024-05-13 DIAGNOSIS — E782 Mixed hyperlipidemia: Secondary | ICD-10-CM | POA: Diagnosis not present

## 2024-05-13 DIAGNOSIS — M5441 Lumbago with sciatica, right side: Secondary | ICD-10-CM | POA: Diagnosis not present

## 2024-05-13 DIAGNOSIS — E669 Obesity, unspecified: Secondary | ICD-10-CM | POA: Diagnosis not present

## 2024-05-13 DIAGNOSIS — E559 Vitamin D deficiency, unspecified: Secondary | ICD-10-CM | POA: Diagnosis not present

## 2024-05-13 DIAGNOSIS — I10 Essential (primary) hypertension: Secondary | ICD-10-CM | POA: Diagnosis not present

## 2024-05-13 LAB — COMPREHENSIVE METABOLIC PANEL WITH GFR
AG Ratio: 2.1 (calc) (ref 1.0–2.5)
ALT: 14 U/L (ref 6–29)
AST: 20 U/L (ref 10–35)
Albumin: 4.4 g/dL (ref 3.6–5.1)
Alkaline phosphatase (APISO): 69 U/L (ref 37–153)
BUN: 18 mg/dL (ref 7–25)
CO2: 29 mmol/L (ref 20–32)
Calcium: 9.6 mg/dL (ref 8.6–10.4)
Chloride: 106 mmol/L (ref 98–110)
Creat: 0.78 mg/dL (ref 0.50–1.05)
Globulin: 2.1 g/dL (ref 1.9–3.7)
Glucose, Bld: 84 mg/dL (ref 65–99)
Potassium: 4 mmol/L (ref 3.5–5.3)
Sodium: 141 mmol/L (ref 135–146)
Total Bilirubin: 0.7 mg/dL (ref 0.2–1.2)
Total Protein: 6.5 g/dL (ref 6.1–8.1)
eGFR: 85 mL/min/1.73m2 (ref >=60)

## 2024-05-13 LAB — QUANTIFERON-TB GOLD PLUS
Mitogen-NIL: >10 [IU]/mL
NIL: 0.02 [IU]/mL
QuantiFERON-TB Gold Plus: NEGATIVE
TB1-NIL: 0 [IU]/mL
TB2-NIL: 0 [IU]/mL

## 2024-05-13 LAB — CBC WITH DIFFERENTIAL/PLATELET
Absolute Lymphocytes: 1728 {cells}/uL (ref 850–3900)
Absolute Monocytes: 499 {cells}/uL (ref 200–950)
Basophils Absolute: 72 {cells}/uL (ref 0–200)
Basophils Relative: 1.5 %
Eosinophils Absolute: 149 {cells}/uL (ref 15–500)
Eosinophils Relative: 3.1 %
HCT: 43.6 % (ref 35.0–45.0)
Hemoglobin: 14.5 g/dL (ref 11.7–15.5)
MCH: 30.9 pg (ref 27.0–33.0)
MCHC: 33.3 g/dL (ref 32.0–36.0)
MCV: 93 fL (ref 80.0–100.0)
MPV: 10.7 fL (ref 7.5–12.5)
Monocytes Relative: 10.4 %
Neutro Abs: 2352 {cells}/uL (ref 1500–7800)
Neutrophils Relative %: 49 %
Platelets: 300 Thousand/uL (ref 140–400)
RBC: 4.69 Million/uL (ref 3.80–5.10)
RDW: 11.5 % (ref 11.0–15.0)
Total Lymphocyte: 36 %
WBC: 4.8 Thousand/uL (ref 3.8–10.8)

## 2024-05-13 NOTE — Progress Notes (Signed)
 TB gold negative

## 2024-05-14 ENCOUNTER — Ambulatory Visit: Admitting: Physical Medicine and Rehabilitation

## 2024-05-14 ENCOUNTER — Other Ambulatory Visit: Payer: Self-pay

## 2024-05-14 VITALS — BP 138/80 | HR 94

## 2024-05-14 DIAGNOSIS — M5416 Radiculopathy, lumbar region: Secondary | ICD-10-CM

## 2024-05-14 MED ORDER — METHYLPREDNISOLONE ACETATE 40 MG/ML IJ SUSP
40.0000 mg | Freq: Once | INTRAMUSCULAR | Status: AC
  Administered 2024-05-14: 40 mg

## 2024-05-14 NOTE — Progress Notes (Signed)
 Courtney Kramer - 63 y.o. female MRN [id number]  Date of birth: [birth year]  Office Visit Note: Visit Date: 05/14/2024 PCP: Silvano Angeline FALCON, NP Referred by: Silvano Angeline FALCON, NP  Subjective: Chief Complaint  Patient presents with   Lower Back - Pain   HPI:  Courtney Kramer is a 63 y.o. female who comes in today at the request of Duwaine Pouch, FNP for planned Right L5-S1 Lumbar Transforaminal epidural steroid injection with fluoroscopic guidance.  The patient has failed conservative care including home exercise, medications, time and activity modification.  This injection will be diagnostic and hopefully therapeutic.  Please see requesting physician notes for further details and justification.   ROS Otherwise per HPI.  Assessment & Plan: Visit Diagnoses:    ICD-10-CM   1. Lumbar radiculopathy  M54.16 XR C-ARM NO REPORT    Epidural Steroid injection    methylPREDNISolone  acetate (DEPO-MEDROL ) injection 40 mg      Plan: No additional findings.   Meds & Orders:  Meds ordered this encounter  Medications   methylPREDNISolone  acetate (DEPO-MEDROL ) injection 40 mg    Orders Placed This Encounter  Procedures   XR C-ARM NO REPORT   Epidural Steroid injection    Follow-up: Return for visit to requesting provider as needed.   Procedures: No procedures performed  Lumbosacral Transforaminal Epidural Steroid Injection - Sub-Pedicular Approach with Fluoroscopic Guidance  Patient: Courtney Kramer      Date of Birth: 22-Jun-1961 MRN: [id number] PCP: Silvano Angeline FALCON, NP      Visit Date: 05/14/2024   Universal Protocol:    Date/Time: 05/14/2024  Consent Given By: the patient  Position: PRONE  Additional Comments: Vital signs were monitored before and after the procedure. Patient was prepped and draped in the usual sterile fashion. The correct patient, procedure, and site was verified.   Injection Procedure Details:   Procedure diagnoses: Lumbar radiculopathy [M54.16]     Meds Administered:  Meds ordered this encounter  Medications   methylPREDNISolone  acetate (DEPO-MEDROL ) injection 40 mg    Laterality: Right  Location/Site: L5  Needle:5.0 in., 22 ga.  Short bevel or Quincke spinal needle  Needle Placement: Transforaminal  Findings:    -Comments: Excellent flow of contrast along the nerve, nerve root and into the epidural space.  Procedure Details: After squaring off the end-plates to get a true AP view, the C-arm was positioned so that an oblique view of the foramen as noted above was visualized. The target area is just inferior to the nose of the scotty dog or sub pedicular. The soft tissues overlying this structure were infiltrated with 2-3 ml. of 1% Lidocaine  without Epinephrine.  The spinal needle was inserted toward the target using a trajectory view along the fluoroscope beam.  Under AP and lateral visualization, the needle was advanced so it did not puncture dura and was located close the 6 O'Clock position of the pedical in AP tracterory. Biplanar projections were used to confirm position. Aspiration was confirmed to be negative for CSF and/or blood. A 1-2 ml. volume of Isovue-250 was injected and flow of contrast was noted at each level. Radiographs were obtained for documentation purposes.   After attaining the desired flow of contrast documented above, a 0.5 to 1.0 ml test dose of 0.25% Marcaine  was injected into each respective transforaminal space.  The patient was observed for 90 seconds post injection.  After no sensory deficits were reported, and normal lower extremity motor function was noted,   the above injectate  was administered so that equal amounts of the injectate were placed at each foramen (level) into the transforaminal epidural space.   Additional Comments:  The patient tolerated the procedure well Dressing: 2 x 2 sterile gauze and Band-Aid    Post-procedure details: Patient was observed during the  procedure. Post-procedure instructions were reviewed.  Patient left the clinic in stable condition.    Clinical History: CLINICAL DATA:  Low back pain, trauma   EXAM: MRI LUMBAR SPINE WITHOUT CONTRAST   TECHNIQUE: Multiplanar, multisequence MR imaging of the lumbar spine was performed. No intravenous contrast was administered.   COMPARISON:  08/20/2018   FINDINGS: Segmentation:  Standard.   Alignment:  Physiologic.   Vertebrae:  No fracture, evidence of discitis, or bone lesion.   Conus medullaris and cauda equina: Conus extends to the L1-2 level. Conus and cauda equina appear normal.   Paraspinal and other soft tissues: Left-sided IVC, an anatomic variant. No acute abnormality.   Disc levels:   T12-L1: Unremarkable.   L1-L2: Tiny shallow left paracentral disc protrusion. No foraminal or canal stenosis. Unchanged.   L2-L3: Mild annular disc bulge and mild bilateral facet hypertrophy. Impress upon the ventral thecal sac without significant canal stenosis. No foraminal stenosis. Unchanged.   L3-L4: Annular disc bulge with mild bilateral facet arthropathy. Impress upon the ventral thecal sac resulting in borderline-mild canal stenosis and bilateral subarticular recess stenosis. No significant foraminal stenosis. Minimal interval progression.   L4-L5: Mild annular disc bulge and mild bilateral facet arthropathy. Previously seen right-sided protrusion has receded. No canal stenosis. Mild right foraminal stenosis.   L5-S1: Minimal disc bulge with mild-to-moderate bilateral facet arthropathy. No foraminal or canal stenosis. Unchanged.   IMPRESSION: 1. Mild multilevel lumbar spondylosis, minimally progressed at L3-4 where there is borderline-mild canal stenosis and bilateral subarticular recess stenosis. 2. Mild right foraminal stenosis at L4-5.     Electronically Signed   By: Mabel Converse D.O.   On: 08/13/2023 09:10     Objective:  VS:  HT:    WT:    BMI:     BP:138/80  HR:94bpm  TEMP: ( )  RESP:  Physical Exam Vitals and nursing note reviewed.  Constitutional:      General: She is not in acute distress.    Appearance: Normal appearance. She is not ill-appearing.  HENT:     Head: Normocephalic and atraumatic.     Right Ear: External ear normal.     Left Ear: External ear normal.  Eyes:     Extraocular Movements: Extraocular movements intact.  Cardiovascular:     Rate and Rhythm: Normal rate.     Pulses: Normal pulses.  Pulmonary:     Effort: Pulmonary effort is normal. No respiratory distress.  Abdominal:     General: There is no distension.     Palpations: Abdomen is soft.  Musculoskeletal:        General: Tenderness present.     Cervical back: Neck supple.     Right lower leg: No edema.     Left lower leg: No edema.     Comments: Patient has good distal strength with no pain over the greater trochanters.  No clonus or focal weakness.  Skin:    Findings: No erythema, lesion or rash.  Neurological:     General: No focal deficit present.     Mental Status: She is alert and oriented to person, place, and time.     Sensory: No sensory deficit.     Motor:  No weakness or abnormal muscle tone.     Coordination: Coordination normal.  Psychiatric:        Mood and Affect: Mood normal.        Behavior: Behavior normal.      Imaging: No results found.

## 2024-05-14 NOTE — Procedures (Signed)
 Lumbosacral Transforaminal Epidural Steroid Injection - Sub-Pedicular Approach with Fluoroscopic Guidance  Patient: Courtney Kramer      Date of Birth: April 04, 1961 MRN: [id number] PCP: Silvano Angeline FALCON, NP      Visit Date: 05/14/2024   Universal Protocol:    Date/Time: 05/14/2024  Consent Given By: the patient  Position: PRONE  Additional Comments: Vital signs were monitored before and after the procedure. Patient was prepped and draped in the usual sterile fashion. The correct patient, procedure, and site was verified.   Injection Procedure Details:   Procedure diagnoses: Lumbar radiculopathy [M54.16]    Meds Administered:  Meds ordered this encounter  Medications   methylPREDNISolone  acetate (DEPO-MEDROL ) injection 40 mg    Laterality: Right  Location/Site: L5  Needle:5.0 in., 22 ga.  Short bevel or Quincke spinal needle  Needle Placement: Transforaminal  Findings:    -Comments: Excellent flow of contrast along the nerve, nerve root and into the epidural space.  Procedure Details: After squaring off the end-plates to get a true AP view, the C-arm was positioned so that an oblique view of the foramen as noted above was visualized. The target area is just inferior to the nose of the scotty dog or sub pedicular. The soft tissues overlying this structure were infiltrated with 2-3 ml. of 1% Lidocaine  without Epinephrine.  The spinal needle was inserted toward the target using a trajectory view along the fluoroscope beam.  Under AP and lateral visualization, the needle was advanced so it did not puncture dura and was located close the 6 O'Clock position of the pedical in AP tracterory. Biplanar projections were used to confirm position. Aspiration was confirmed to be negative for CSF and/or blood. A 1-2 ml. volume of Isovue-250 was injected and flow of contrast was noted at each level. Radiographs were obtained for documentation purposes.   After attaining the desired flow  of contrast documented above, a 0.5 to 1.0 ml test dose of 0.25% Marcaine  was injected into each respective transforaminal space.  The patient was observed for 90 seconds post injection.  After no sensory deficits were reported, and normal lower extremity motor function was noted,   the above injectate was administered so that equal amounts of the injectate were placed at each foramen (level) into the transforaminal epidural space.   Additional Comments:  The patient tolerated the procedure well Dressing: 2 x 2 sterile gauze and Band-Aid    Post-procedure details: Patient was observed during the procedure. Post-procedure instructions were reviewed.  Patient left the clinic in stable condition.

## 2024-05-14 NOTE — Progress Notes (Signed)
 Pain Scale   Average Pain 4 Patient advising she has chronic lower back pain radiating to her right leg pain increase when walking and standing, and decreases when sitting.        +Driver, -BT, -Dye Allergies.

## 2024-05-26 ENCOUNTER — Other Ambulatory Visit: Payer: Self-pay | Admitting: Physician Assistant

## 2024-05-26 ENCOUNTER — Other Ambulatory Visit: Payer: Self-pay

## 2024-05-26 DIAGNOSIS — M0579 Rheumatoid arthritis with rheumatoid factor of multiple sites without organ or systems involvement: Secondary | ICD-10-CM

## 2024-05-26 DIAGNOSIS — Z79899 Other long term (current) drug therapy: Secondary | ICD-10-CM

## 2024-05-26 MED ORDER — ACTEMRA ACTPEN 162 MG/0.9ML ~~LOC~~ SOAJ
162.0000 mg | SUBCUTANEOUS | 0 refills | Status: DC
  Filled 2024-05-26: qty 1.8, 28d supply, fill #0

## 2024-05-26 NOTE — Progress Notes (Signed)
 Specialty Pharmacy Refill Coordination Note  Courtney Kramer is a 63 y.o. female contacted today regarding refills of specialty medication(s) Tocilizumab  (Actemra  ACTPen)   Patient requested Delivery   Delivery date: 06/04/24   Verified address: [address] [place]   Medication will be filled on 09.17.25.   This fill date is pending response to refill request from provider. Patient is aware and if they have not received fill by intended date they must follow up with pharmacy.

## 2024-05-26 NOTE — Telephone Encounter (Signed)
 Last Fill: 04/27/2024  Labs: 05/11/2024 CBC and CMP WNL   07/29/2023 Lipid Panel LDL 103  TB Gold: 05/11/2024 TB gold negative   Next Visit: 06/15/2024  Last Visit: 01/13/2024  IK:Myzlfjunpi arthritis involving multiple sites with positive rheumatoid factor (HCC)   Current Dose per office note 01/13/2024: Actemra  162 mg sq injections every 14 days.   Patient to update lipid panel at the follow up visit. An order is already in order review.    Okay to refill Actemra ?

## 2024-06-02 ENCOUNTER — Encounter: Payer: Self-pay | Admitting: Obstetrics and Gynecology

## 2024-06-02 ENCOUNTER — Other Ambulatory Visit: Payer: Self-pay | Admitting: Obstetrics and Gynecology

## 2024-06-02 DIAGNOSIS — Z1231 Encounter for screening mammogram for malignant neoplasm of breast: Secondary | ICD-10-CM

## 2024-06-02 DIAGNOSIS — Z0000 Encounter for general adult medical examination without abnormal findings: Secondary | ICD-10-CM

## 2024-06-02 NOTE — Progress Notes (Signed)
 Office Visit Note  Patient: Courtney Kramer             Date of Birth: August 19, 1961           MRN: [id number]             PCP: Silvano Angeline FALCON, NP Referring: Silvano Angeline FALCON, NP Visit Date: 06/15/2024 Occupation: DONALDA  Subjective:  Medication monitoring  History of Present Illness: Courtney Kramer is a 63 y.o. female with history of rheumatoid arthritis and osteoarthritis.  Patient remains on Plaquenil  200 mg 1 tablet by mouth twice daily and Actemra  162 mg sq injections every 14 days.  She denies any gaps in therapy. She continues to have chronic pain in both knees. She denies any cane or walker.  She denies any joint swelling.  Patient states that at times her knees will buckle up to the point that she has a fall.  She has occasional discomfort in her hands but overall feels that her symptoms have been well-controlled on the current treatment regimen.  She take Celebrex 200 mg 1 capsule daily for pain relief.  She also remains on Cymbalta 60 mg daily. She denies any recent or recurrent infections.    Activities of Daily Living:  Patient reports morning stiffness for 10 minutes.   Patient Denies nocturnal pain.  Difficulty dressing/grooming: Denies Difficulty climbing stairs: Reports Difficulty getting out of chair: Reports Difficulty using hands for taps, buttons, cutlery, and/or writing: Denies  Review of Systems  Constitutional:  Positive for fatigue.  HENT:  Negative for mouth sores and mouth dryness.   Eyes:  Positive for dryness.  Respiratory:  Negative for shortness of breath.   Cardiovascular:  Negative for chest pain and palpitations.  Gastrointestinal:  Negative for blood in stool, constipation and diarrhea.  Endocrine: Negative for increased urination.  Genitourinary:  Negative for involuntary urination.  Musculoskeletal:  Positive for joint pain, gait problem, joint pain, joint swelling, muscle weakness, morning stiffness and muscle tenderness. Negative for  myalgias and myalgias.  Skin:  Negative for color change, rash, hair loss and sensitivity to sunlight.  Allergic/Immunologic: Negative for susceptible to infections.  Neurological:  Negative for dizziness and headaches.  Hematological:  Negative for swollen glands.  Psychiatric/Behavioral:  Positive for depressed mood. Negative for sleep disturbance. The patient is not nervous/anxious.     PMFS History:  Patient Active Problem List   Diagnosis Date Noted   Anxiety 06/11/2023   Depressive disorder 06/11/2023   Dysphagia 06/11/2023   Essential hypertension 06/11/2023   Rheumatoid arthritis involving multiple sites with positive rheumatoid factor (HCC) 04/21/2020   High risk medication use 04/21/2020   Primary osteoarthritis of both hands 04/21/2020   Primary osteoarthritis of both knees 04/21/2020   Primary osteoarthritis of both feet 04/21/2020   DDD (degenerative disc disease), lumbar 04/21/2020   Fracture, Colles, right, closed 04/09/2018   Closed fracture of right distal radius and ulna, initial encounter 03/27/2018   Lumbar radiculopathy 09/19/2016   RENAL CALCULUS 05/04/2008   FLANK PAIN, RIGHT 05/04/2008    Past Medical History:  Diagnosis Date   Anxiety    Chronic back pain    Chronic back pain    GERD (gastroesophageal reflux disease)    History of cardiac murmur    07-26-2020  per pt happen after last pregnancy, work-up done was ok, and was told few years pcp no longer hears it   History of kidney stones    Kidney stones    OA (  osteoarthritis)    hands , knees, feet   RA (rheumatoid arthritis) (HCC)    rheumotology--- Dr GORMAN. Deveshwar--- multiple sites seropositive/ positive rheumotoid factor,  taking plaquinel   Right ureteral calculus    Wears glasses     Family History  Problem Relation Age of Onset   Fibromyalgia Mother    Heart disease Mother    Macular degeneration Mother    Rheum arthritis Father    Rheum arthritis Sister    Cancer Brother    Breast  cancer Neg Hx    Past Surgical History:  Procedure Laterality Date   COLONOSCOPY  2012   CYSTO/  RIGHT URETERAL STENT PLACEMENT  05-10-2008   CYSTOSCOPY W/ URETERAL STENT PLACEMENT Right 06/09/2014   Procedure: CYSTOSCOPY WITH RETROGRADE PYELOGRAM/URETERAL STENT PLACEMENT;  Surgeon: Alm GORMAN Fragmin, MD;  Location: [facility];  Service: Urology;  Laterality: Right;   CYSTOSCOPY W/ URETERAL STENT PLACEMENT Left 02/17/2020   Procedure: CYSTOSCOPY WITH RETROGRADE PYELOGRAM/URETERAL STENT PLACEMENT;  Surgeon: Alvaro Hummer, MD;  Location: [facility];  Service: Urology;  Laterality: Left;  45 MINS   CYSTOSCOPY/URETEROSCOPY/HOLMIUM LASER/STENT PLACEMENT Left 02/22/2020   Procedure: CYSTOSCOPY/LEFT URETEROSCOPY/HOLMIUM LASER/STENT EXCHANGE;  Surgeon: Watt Rush, MD;  Location: [facility];  Service: Urology;  Laterality: Left;   CYSTOSCOPY/URETEROSCOPY/HOLMIUM LASER/STENT PLACEMENT Right 07/28/2020   Procedure: CYSTOSCOPY RIGHT URETEROSCOPY RIGHT RETROGRADE PYELOGRAM HOLMIUM LASER/STENT PLACEMENT;  Surgeon: Watt Rush, MD;  Location: [facility];  Service: Urology;  Laterality: Right;   EXTRACORPOREAL SHOCK WAVE LITHOTRIPSY Right 05-10-2008   KNEE ARTHROSCOPY Left 2009   OPEN REDUCTION INTERNAL FIXATION (ORIF) DISTAL RADIAL FRACTURE Right 03/27/2018   Procedure: OPEN REDUCTION INTERNAL FIXATION (ORIF) RIGHT DISTAL RADIUS FRACTURE;  Surgeon: Jerri Kay HERO, MD;  Location: [facility];  Service: Orthopedics;  Laterality: Right;   WISDOM TOOTH EXTRACTION  AGE 66   Social History   Tobacco Use   Smoking status: Never    Passive exposure: Never   Smokeless tobacco: Never  Vaping Use   Vaping status: Never Used  Substance Use Topics   Alcohol use: Yes    Comment: OCCASONAL   Drug use: Never   Social History   Social History Narrative   Not on file     Immunization History  Administered Date(s) Administered   Influenza Inj Mdck Quad With Preservative  08/02/2019   Influenza,inj,Quad PF,6+ Mos 06/30/2018     Objective: Vital Signs: BP (!) 161/87 (BP Location: Left Arm, Patient Position: Sitting, Cuff Size: Normal)   Pulse 73   Temp 97.6 F (36.4 C)   Resp 14   Ht 5' 6 (1.676 m)   Wt 177 lb (80.3 kg)   BMI 28.57 kg/m    Physical Exam Vitals and nursing note reviewed.  Constitutional:      Appearance: She is well-developed.  HENT:     Head: Normocephalic and atraumatic.  Eyes:     Conjunctiva/sclera: Conjunctivae normal.  Cardiovascular:     Rate and Rhythm: Normal rate and regular rhythm.     Heart sounds: Normal heart sounds.  Pulmonary:     Effort: Pulmonary effort is normal.     Breath sounds: Normal breath sounds.  Abdominal:     General: Bowel sounds are normal.     Palpations: Abdomen is soft.  Musculoskeletal:     Cervical back: Normal range of motion.  Lymphadenopathy:     Cervical: No cervical adenopathy.  Skin:    General: Skin is warm and dry.  Capillary Refill: Capillary refill takes less than 2 seconds.  Neurological:     Mental Status: She is alert and oriented to person, place, and time.  Psychiatric:        Behavior: Behavior normal.      Musculoskeletal Exam: C-spine, thoracic spine, lumbar spine have good range of motion.  No midline spinal tenderness.  No SI joint tenderness.  Shoulder joints, elbow joints, wrist joints, MCPs, PIPs, DIPs have good range of motion with no synovitis.  Thickening of PIP and DIP joints , most prominent in bilateral third PIP joints.  Complete fist formation bilaterally.  Hip joints have good range of motion with no groin pain.  Knee joints have good range of motion no warmth or effusion.  Crepitus of both knees noted.  Ankle joints have good range of motion no tenderness or joint swelling.  No evidence of Achilles tendinitis or plantar fasciitis.   CDAI Exam: CDAI Score: -- Patient Global: --; Provider Global: -- Swollen: --; Tender: -- Joint Exam 06/15/2024    No joint exam has been documented for this visit   There is currently no information documented on the homunculus. Go to the Rheumatology activity and complete the homunculus joint exam.  Investigation: No additional findings.  Imaging: No results found.   Recent Labs: Lab Results  Component Value Date   WBC 4.8 05/11/2024   HGB 14.5 05/11/2024   PLT 300 05/11/2024   NA 141 05/11/2024   K 4.0 05/11/2024   CL 106 05/11/2024   CO2 29 05/11/2024   GLUCOSE 84 05/11/2024   BUN 18 05/11/2024   CREATININE 0.78 05/11/2024   BILITOT 0.7 05/11/2024   AST 20 05/11/2024   ALT 14 05/11/2024   PROT 6.5 05/11/2024   CALCIUM  9.6 05/11/2024   GFRAA 87 03/15/2021   QFTBGOLDPLUS NEGATIVE 05/11/2024    Speciality Comments: PLQ eye exam: 07/18/2023 WNL. [facility] f/u 1 year.   Arava - no reponse dcd after 4 months Enbrel  04/02/22 Patient does not want to take methotrexate as she believes it caused toxicity to her father.  Procedures:  No procedures performed Allergies: Sulfa antibiotics    Assessment / Plan:     Visit Diagnoses: Rheumatoid arthritis involving multiple sites with positive rheumatoid factor (HCC): She has no synovitis on examination today.  She has not had any signs or symptoms of a rheumatoid arthritis flare.  She has clinically been doing well taking Plaquenil  200 mg 1 tablet by mouth twice daily and Actemra  162 mg sq injections once every 14 days.  She is tolerating combination therapy without any side effects and has not had any gaps in therapy.  She experiences intermittent discomfort in both knee joints but has no warmth or effusion on examination today.  Mild crepitus noted with range of motion of both knees.  She will remain on Actemra  and Plaquenil  as prescribed.  She was advised to notify us  if she develops any signs or symptoms of a flare.  She will follow-up in the office in 5 months or sooner if needed.  High risk medication use - Plaquenil  200 mg 1  tablet by mouth twice daily and Actemra  162 mg sq injections every 14 days. Actemra  162 mg sq injections every 14 days--started 08/08/23. CBC and CMP were drawn on 05/11/2024.  Her next lab work will be due in November and every 3 months to monitor for drug toxicity. TB Gold negative on 05/11/2024. Recent lipid panel obtained by PCP--plan to call for results.  PLQ eye exam: 07/18/2023 WNL. [facility] f/u 1 year.  No recent or recurrent infections. No new medical conditions.  Pseudogout - No signs or symptoms of a flare.  She takes celebrex 200 mg 1 capsule daily for pain relief.  Primary osteoarthritis of both hands: She has PIP and DIP thickening consistent with osteoarthritis of both hands--most prominent involving bilateral third PIP joints.  No synovitis noted.  Effusion, right knee: No warmth or effusion noted.  Primary osteoarthritis of both knees: She continues to experience intermittent discomfort in both knee joints.  Crepitus with range of motion of both knees noted.  No warmth or effusion noted.  She experiences intermittent buckling in both knees.  Discussed importance of lower extremity muscle strengthening.  Patient is given a handout of exercises to perform.  Primary osteoarthritis of both feet: She has good range of motion of both ankle joints with no tenderness or joint swelling.  Degeneration of intervertebral disc of lumbar region without discogenic back pain or lower extremity pain - Under care of Dr. Eldonna.  Patient underwent an epidural injection on 06/10/2023.  Doing well.  Other medical conditions are listed as follows:  Other fatigue  Closed Colles' fracture of right radius with routine healing, subsequent encounter  RENAL CALCULUS  Essential hypertension  Family history of rheumatoid arthritis  Orders: No orders of the defined types were placed in this encounter.  No orders of the defined types were placed in this encounter.   Follow-Up Instructions:  Return in about 5 months (around 11/14/2024) for Rheumatoid arthritis, Osteoarthritis.   Waddell CHRISTELLA Craze, PA-C  Note - This record has been created using Dragon software.  Chart creation errors have been sought, but may not always  have been located. Such creation errors do not reflect on  the standard of medical care.

## 2024-06-03 ENCOUNTER — Other Ambulatory Visit: Payer: Self-pay

## 2024-06-11 ENCOUNTER — Ambulatory Visit
Admission: RE | Admit: 2024-06-11 | Discharge: 2024-06-11 | Disposition: A | Discharge status: Home / Self Care | Source: Ambulatory Visit | Attending: Obstetrics and Gynecology | Admitting: Obstetrics and Gynecology

## 2024-06-11 ENCOUNTER — Ambulatory Visit

## 2024-06-11 DIAGNOSIS — Z1231 Encounter for screening mammogram for malignant neoplasm of breast: Secondary | ICD-10-CM

## 2024-06-15 ENCOUNTER — Ambulatory Visit: Attending: Physician Assistant | Admitting: Physician Assistant

## 2024-06-15 ENCOUNTER — Encounter: Payer: Self-pay | Admitting: Physician Assistant

## 2024-06-15 VITALS — BP 161/87 | HR 73 | Temp 97.6°F | Resp 14 | Ht 66.0 in | Wt 177.0 lb

## 2024-06-15 DIAGNOSIS — M51369 Other intervertebral disc degeneration, lumbar region without mention of lumbar back pain or lower extremity pain: Secondary | ICD-10-CM | POA: Diagnosis not present

## 2024-06-15 DIAGNOSIS — M19072 Primary osteoarthritis, left ankle and foot: Secondary | ICD-10-CM | POA: Diagnosis present

## 2024-06-15 DIAGNOSIS — M19042 Primary osteoarthritis, left hand: Secondary | ICD-10-CM | POA: Diagnosis present

## 2024-06-15 DIAGNOSIS — I10 Essential (primary) hypertension: Secondary | ICD-10-CM | POA: Insufficient documentation

## 2024-06-15 DIAGNOSIS — M19041 Primary osteoarthritis, right hand: Secondary | ICD-10-CM | POA: Insufficient documentation

## 2024-06-15 DIAGNOSIS — M170 Bilateral primary osteoarthritis of knee: Secondary | ICD-10-CM | POA: Insufficient documentation

## 2024-06-15 DIAGNOSIS — Z8261 Family history of arthritis: Secondary | ICD-10-CM | POA: Diagnosis present

## 2024-06-15 DIAGNOSIS — Z79899 Other long term (current) drug therapy: Secondary | ICD-10-CM | POA: Diagnosis not present

## 2024-06-15 DIAGNOSIS — S52531D Colles' fracture of right radius, subsequent encounter for closed fracture with routine healing: Secondary | ICD-10-CM | POA: Insufficient documentation

## 2024-06-15 DIAGNOSIS — M0579 Rheumatoid arthritis with rheumatoid factor of multiple sites without organ or systems involvement: Secondary | ICD-10-CM | POA: Insufficient documentation

## 2024-06-15 DIAGNOSIS — R5383 Other fatigue: Secondary | ICD-10-CM | POA: Diagnosis not present

## 2024-06-15 DIAGNOSIS — M25461 Effusion, right knee: Secondary | ICD-10-CM | POA: Insufficient documentation

## 2024-06-15 DIAGNOSIS — M19071 Primary osteoarthritis, right ankle and foot: Secondary | ICD-10-CM | POA: Diagnosis not present

## 2024-06-15 DIAGNOSIS — N200 Calculus of kidney: Secondary | ICD-10-CM | POA: Insufficient documentation

## 2024-06-15 DIAGNOSIS — M1120 Other chondrocalcinosis, unspecified site: Secondary | ICD-10-CM | POA: Diagnosis not present

## 2024-06-15 NOTE — Patient Instructions (Addendum)
 Standing Labs We placed an order today for your standing lab work.   Please have your standing labs drawn at the end of November and every 3 months  Please have your labs drawn 2 weeks prior to your appointment so that the provider can discuss your lab results at your appointment, if possible.  Please note that you may see your imaging and lab results in MyChart before we have reviewed them. We will contact you once all results are reviewed. Please allow our office up to 72 hours to thoroughly review all of the results before contacting the office for clarification of your results.  WALK-IN LAB HOURS  Monday through Thursday from 8:00 am -12:30 pm and 1:00 pm-4:30 pm and Friday from 8:00 am-12:00 pm.  Patients with office visits requiring labs will be seen before walk-in labs.  You may encounter longer than normal wait times. Please allow additional time. Wait times may be shorter on  Monday and Thursday afternoons.  We do not book appointments for walk-in labs. We appreciate your patience and understanding with our staff.   Labs are drawn by [facility]. Please bring your co-pay at the time of your lab draw.  You may receive a bill from [facility] for your lab work.  Please note if you are on Hydroxychloroquine  and and an order has been placed for a Hydroxychloroquine  level,  you will need to have it drawn 4 hours or more after your last dose.  If you wish to have your labs drawn at another location, please call the office 24 hours in advance so we can fax the orders.  The office is located at [address], [place], [place] [address]   If you have any questions regarding directions or hours of operation,  please call [phone].   As a reminder, please drink plenty of water prior to coming for your lab work. Thanks!  Knee Exercises Ask your health care provider which exercises are safe for you. Do exercises exactly as told by your health care provider and adjust them as directed.  It is normal to feel mild stretching, pulling, tightness, or discomfort as you do these exercises. Stop right away if you feel sudden pain or your pain gets worse. Do not begin these exercises until told by your health care provider. Stretching and range-of-motion exercises These exercises warm up your muscles and joints and improve the movement and flexibility of your knee. These exercises also help to relieve pain and swelling. Knee extension, prone  Lie on your abdomen (prone position) on a bed. Place your left / right knee just beyond the edge of the surface so your knee is not on the bed. You can put a towel under your left / right thigh just above your kneecap for comfort. Relax your leg muscles and allow gravity to straighten your knee (extension). You should feel a stretch behind your left / right knee. Hold this position for __________ seconds. Scoot up so your knee is supported between repetitions. Repeat __________ times. Complete this exercise __________ times a day. Knee flexion, active  Lie on your back with both legs straight. If this causes back discomfort, bend your left / right knee so your foot is flat on the floor. Slowly slide your left / right heel back toward your buttocks. Stop when you feel a gentle stretch in the front of your knee or thigh (flexion). Hold this position for __________ seconds. Slowly slide your left / right heel back to the starting  position. Repeat __________ times. Complete this exercise __________ times a day. Quadriceps stretch, prone  Lie on your abdomen on a firm surface, such as a bed or padded floor. Bend your left / right knee and hold your ankle. If you cannot reach your ankle or pant leg, loop a belt around your foot and grab the belt instead. Gently pull your heel toward your buttocks. Your knee should not slide out to the side. You should feel a stretch in the front of your thigh and knee (quadriceps). Hold this position for __________  seconds. Repeat __________ times. Complete this exercise __________ times a day. Hamstring, supine  Lie on your back (supine position). Loop a belt or towel over the ball of your left / right foot. The ball of your foot is on the walking surface, right under your toes. Straighten your left / right knee and slowly pull on the belt to raise your leg until you feel a gentle stretch behind your knee (hamstring). Do not let your knee bend while you do this. Keep your other leg flat on the floor. Hold this position for __________ seconds. Repeat __________ times. Complete this exercise __________ times a day. Strengthening exercises These exercises build strength and endurance in your knee. Endurance is the ability to use your muscles for a long time, even after they get tired. Quadriceps, isometric This exercise strengthens the muscles in front of your thigh (quadriceps) without moving your knee joint (isometric). Lie on your back with your left / right leg extended and your other knee bent. Put a rolled towel or small pillow under your knee if told by your health care provider. Slowly tense the muscles in the front of your left / right thigh. You should see your kneecap slide up toward your hip or see increased dimpling just above the knee. This motion will push the back of the knee toward the floor. For __________ seconds, hold the muscle as tight as you can without increasing your pain. Relax the muscles slowly and completely. Repeat __________ times. Complete this exercise __________ times a day. Straight leg raises This exercise strengthens the muscles in front of your thigh (quadriceps) and the muscles that move your hips (hip flexors). Lie on your back with your left / right leg extended and your other knee bent. Tense the muscles in the front of your left / right thigh. You should see your kneecap slide up or see increased dimpling just above the knee. Your thigh may even shake a bit. Keep  these muscles tight as you raise your leg 4-6 inches (10-15 cm) off the floor. Do not let your knee bend. Hold this position for __________ seconds. Keep these muscles tense as you lower your leg. Relax your muscles slowly and completely after each repetition. Repeat __________ times. Complete this exercise __________ times a day. Hamstring, isometric  Lie on your back on a firm surface. Bend your left / right knee about __________ degrees. Dig your left / right heel into the surface as if you are trying to pull it toward your buttocks. Tighten the muscles in the back of your thighs (hamstring) to dig as hard as you can without increasing any pain. Hold this position for __________ seconds. Release the tension gradually and allow your muscles to relax completely for __________ seconds after each repetition. Repeat __________ times. Complete this exercise __________ times a day. Hamstring curls If told by your health care provider, do this exercise while wearing ankle weights. Begin with  __________lb / kg weights. Then increase the weight by 1 lb (0.5 kg) increments. Do not wear ankle weights that are more than __________lb / kg. Lie on your abdomen with your legs straight. Bend your left / right knee as far as you can without feeling pain. Keep your hips flat against the floor. Hold this position for __________ seconds. Slowly lower your leg to the starting position. Repeat __________ times. Complete this exercise __________ times a day. Squats This exercise strengthens the muscles in front of your thigh and knee (quadriceps). Stand in front of a table, with your feet and knees pointing straight ahead. You may rest your hands on the table for balance but not for support. Slowly bend your knees and lower your hips like you are going to sit in a chair. Keep your weight over your heels, not over your toes. Keep your lower legs upright so they are parallel with the table legs. Do not let your  hips go lower than your knees. Do not bend lower than told by your health care provider. If your knee pain increases, do not bend as low. Hold the squat position for __________ seconds. Slowly push with your legs to return to standing. Do not use your hands to pull yourself to standing. Repeat __________ times. Complete this exercise __________ times a day. Wall slides This exercise strengthens the muscles in front of your thigh and knee (quadriceps). Lean your back against a smooth wall or door, and walk your feet out 18-24 inches (46-61 cm) from it. Place your feet hip-width apart. Slowly slide down the wall or door until your knees bend __________ degrees. Keep your knees over your heels, not over your toes. Keep your knees in line with your hips. Hold this position for __________ seconds. Repeat __________ times. Complete this exercise __________ times a day. Straight leg raises, side-lying This exercise strengthens the muscles that rotate the leg at the hip and move it away from your body (hip abductors). Lie on your side with your left / right leg in the top position. Lie so your head, shoulder, knee, and hip line up. You may bend your bottom knee to help you keep your balance. Roll your hips slightly forward so your hips are stacked directly over each other and your left / right knee is facing forward. Leading with your heel, lift your top leg 4-6 inches (10-15 cm). You should feel the muscles in your outer hip lifting. Do not let your foot drift forward. Do not let your knee roll toward the ceiling. Hold this position for __________ seconds. Slowly return your leg to the starting position. Let your muscles relax completely after each repetition. Repeat __________ times. Complete this exercise __________ times a day. Straight leg raises, prone This exercise stretches the muscles that move your hips away from the front of the pelvis (hip extensors). Lie on your abdomen on a firm  surface. You can put a pillow under your hips if that is more comfortable. Tense the muscles in your buttocks and lift your left / right leg about 4-6 inches (10-15 cm). Keep your knee straight as you lift your leg. Hold this position for __________ seconds. Slowly lower your leg to the starting position. Let your leg relax completely after each repetition. Repeat __________ times. Complete this exercise __________ times a day. This information is not intended to replace advice given to you by your health care provider. Make sure you discuss any questions you have with your health  care provider. Document Revised: 05/16/2021 Document Reviewed: 05/16/2021 Elsevier Patient Education  2024 [employer].

## 2024-06-16 ENCOUNTER — Telehealth: Payer: Self-pay

## 2024-06-16 ENCOUNTER — Other Ambulatory Visit: Payer: Self-pay | Admitting: Obstetrics and Gynecology

## 2024-06-16 DIAGNOSIS — R928 Other abnormal and inconclusive findings on diagnostic imaging of breast: Secondary | ICD-10-CM

## 2024-06-16 NOTE — Telephone Encounter (Signed)
 Labs received from:[facility]  Drawn on:04/02/2024  Reviewed by:Waddell Craze, PA-C  Labs drawn:Lipid Panel  Results:LDL 127

## 2024-06-18 ENCOUNTER — Encounter

## 2024-06-18 ENCOUNTER — Ambulatory Visit
Admission: RE | Admit: 2024-06-18 | Discharge: 2024-06-18 | Disposition: A | Discharge status: Home / Self Care | Source: Ambulatory Visit | Attending: Obstetrics and Gynecology | Admitting: Obstetrics and Gynecology

## 2024-06-18 ENCOUNTER — Other Ambulatory Visit

## 2024-06-18 ENCOUNTER — Other Ambulatory Visit: Payer: Self-pay | Admitting: Obstetrics and Gynecology

## 2024-06-18 DIAGNOSIS — R928 Other abnormal and inconclusive findings on diagnostic imaging of breast: Secondary | ICD-10-CM

## 2024-06-18 DIAGNOSIS — N63 Unspecified lump in unspecified breast: Secondary | ICD-10-CM

## 2024-06-19 ENCOUNTER — Encounter: Payer: Self-pay | Admitting: Family

## 2024-06-24 ENCOUNTER — Other Ambulatory Visit: Payer: Self-pay

## 2024-06-24 ENCOUNTER — Other Ambulatory Visit: Payer: Self-pay | Admitting: Physician Assistant

## 2024-06-24 DIAGNOSIS — M0579 Rheumatoid arthritis with rheumatoid factor of multiple sites without organ or systems involvement: Secondary | ICD-10-CM

## 2024-06-24 DIAGNOSIS — Z79899 Other long term (current) drug therapy: Secondary | ICD-10-CM

## 2024-06-24 MED ORDER — ACTEMRA ACTPEN 162 MG/0.9ML ~~LOC~~ SOAJ
162.0000 mg | SUBCUTANEOUS | 2 refills | Status: AC
  Filled 2024-06-24 – 2024-06-26 (×2): qty 1.8, 28d supply, fill #0
  Filled 2024-08-10: qty 1.8, 28d supply, fill #1
  Filled 2024-09-15 – 2024-09-16 (×2): qty 1.8, 28d supply, fill #2

## 2024-06-24 NOTE — Telephone Encounter (Signed)
 Last Fill: 05/26/2024 (30 day supply)  Labs: 05/11/2024 CBC and CMP WNL  TB Gold: 05/11/2024 Neg    Next Visit: 11/16/2024  Last Visit: 06/15/2024  IK:Myzlfjunpi arthritis involving multiple sites with positive rheumatoid factor   Current Dose per office note 06/15/2024: Actemra  162 mg sq injections every 14 days   Okay to refill Actemra ?

## 2024-06-25 ENCOUNTER — Encounter

## 2024-06-25 ENCOUNTER — Other Ambulatory Visit

## 2024-06-26 ENCOUNTER — Other Ambulatory Visit: Payer: Self-pay

## 2024-06-30 ENCOUNTER — Other Ambulatory Visit: Payer: Self-pay

## 2024-06-30 ENCOUNTER — Other Ambulatory Visit (HOSPITAL_COMMUNITY): Payer: Self-pay

## 2024-06-30 NOTE — Progress Notes (Signed)
 Specialty Pharmacy Refill Coordination Note  Spoke with GESENIA BANTZ  ANIE JUNIEL is a 63 y.o. female contacted today regarding refills of specialty medication(s) Tocilizumab  (Actemra  ACTPen)  Doses on hand: 2, for 10/14 & 10/28  Injection date: 07/28/24   Patient requested: Delivery   Delivery date: 07/21/24   Verified address: [address] [place] [address]  Medication will be filled on 07/20/24.

## 2024-07-16 DIAGNOSIS — M0579 Rheumatoid arthritis with rheumatoid factor of multiple sites without organ or systems involvement: Secondary | ICD-10-CM | POA: Diagnosis not present

## 2024-07-16 DIAGNOSIS — E663 Overweight: Secondary | ICD-10-CM | POA: Diagnosis not present

## 2024-07-16 DIAGNOSIS — Z6828 Body mass index (BMI) 28.0-28.9, adult: Secondary | ICD-10-CM | POA: Diagnosis not present

## 2024-07-20 ENCOUNTER — Other Ambulatory Visit: Payer: Self-pay

## 2024-07-30 LAB — OPHTHALMOLOGY REPORT-SCANNED

## 2024-08-10 ENCOUNTER — Other Ambulatory Visit: Payer: Self-pay

## 2024-08-10 NOTE — Progress Notes (Signed)
 Specialty Pharmacy Refill Coordination Note  Courtney Kramer is a 63 y.o. female contacted today regarding refills of specialty medication(s) Tocilizumab  (Actemra  ACTPen)   Patient requested Delivery   Delivery date: 08/27/24   Verified address: [address] [place] [address]   Medication will be filled on: 08/26/24

## 2024-08-26 ENCOUNTER — Other Ambulatory Visit: Payer: Self-pay

## 2024-09-03 ENCOUNTER — Telehealth: Payer: Self-pay | Admitting: Pharmacist

## 2024-09-03 NOTE — Telephone Encounter (Signed)
 Submitted a Prior Authorization renewal request to [employer] for ACTEMRA  SQ via CoverMyMeds. Will update once we receive a response.  Key: [id number]

## 2024-09-03 NOTE — Telephone Encounter (Signed)
 Received notification from [employer] regarding a prior authorization for ACTEMRA  SQ. Authorization has been APPROVED from 09/03/2024 to 09/03/2025. Approval letter sent to scan center.  Authorization # [id number]  Sherry Pennant, PharmD, MPH, BCPS, CPP Clinical Pharmacist

## 2024-09-15 ENCOUNTER — Other Ambulatory Visit (HOSPITAL_COMMUNITY): Payer: Self-pay

## 2024-09-16 ENCOUNTER — Other Ambulatory Visit: Payer: Self-pay

## 2024-09-18 ENCOUNTER — Other Ambulatory Visit: Payer: Self-pay

## 2024-09-22 ENCOUNTER — Other Ambulatory Visit (HOSPITAL_COMMUNITY): Payer: Self-pay

## 2024-09-22 NOTE — Progress Notes (Signed)
 Specialty Pharmacy Refill Coordination Note  Courtney Kramer is a 64 y.o. female contacted today regarding refills of specialty medication(s) Tocilizumab  (Actemra  ACTPen)   Patient requested Delivery   Delivery date: 09/29/24   Verified address: [address] [place] [address]   Medication will be filled on: 09/28/24

## 2024-09-28 ENCOUNTER — Other Ambulatory Visit: Payer: Self-pay

## 2024-10-19 ENCOUNTER — Other Ambulatory Visit: Payer: Self-pay | Admitting: Physician Assistant

## 2024-10-19 ENCOUNTER — Other Ambulatory Visit: Payer: Self-pay

## 2024-10-19 DIAGNOSIS — M0579 Rheumatoid arthritis with rheumatoid factor of multiple sites without organ or systems involvement: Secondary | ICD-10-CM

## 2024-10-19 DIAGNOSIS — Z79899 Other long term (current) drug therapy: Secondary | ICD-10-CM

## 2024-10-21 ENCOUNTER — Other Ambulatory Visit: Payer: Self-pay | Admitting: Physician Assistant

## 2024-10-21 ENCOUNTER — Other Ambulatory Visit: Payer: Self-pay

## 2024-10-21 DIAGNOSIS — M0579 Rheumatoid arthritis with rheumatoid factor of multiple sites without organ or systems involvement: Secondary | ICD-10-CM

## 2024-10-21 DIAGNOSIS — Z79899 Other long term (current) drug therapy: Secondary | ICD-10-CM

## 2024-10-21 NOTE — Telephone Encounter (Signed)
 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 Contacted the patient and patient states she is still taking the medication as prescribed.   Okay to refill Plaquenil ?

## 2024-10-22 ENCOUNTER — Other Ambulatory Visit: Payer: Self-pay | Admitting: *Deleted

## 2024-10-22 DIAGNOSIS — Z79899 Other long term (current) drug therapy: Secondary | ICD-10-CM

## 2024-10-23 ENCOUNTER — Ambulatory Visit: Payer: Self-pay | Admitting: Physician Assistant

## 2024-10-23 LAB — COMPREHENSIVE METABOLIC PANEL WITH GFR
AG Ratio: 2.4 (calc) (ref 1.0–2.5)
ALT: 29 U/L (ref 6–29)
AST: 28 U/L (ref 10–35)
Albumin: 4.7 g/dL (ref 3.6–5.1)
Alkaline phosphatase (APISO): 85 U/L (ref 37–153)
BUN: 16 mg/dL (ref 7–25)
CO2: 31 mmol/L (ref 20–32)
Calcium: 9.5 mg/dL (ref 8.6–10.4)
Chloride: 105 mmol/L (ref 98–110)
Creat: 0.8 mg/dL (ref 0.50–1.05)
Globulin: 2 g/dL (ref 1.9–3.7)
Glucose, Bld: 76 mg/dL (ref 65–99)
Potassium: 4.4 mmol/L (ref 3.5–5.3)
Sodium: 141 mmol/L (ref 135–146)
Total Bilirubin: 0.7 mg/dL (ref 0.2–1.2)
Total Protein: 6.7 g/dL (ref 6.1–8.1)
eGFR: 83 mL/min/{1.73_m2}

## 2024-10-23 LAB — CBC WITH DIFFERENTIAL/PLATELET
Absolute Lymphocytes: 1914 {cells}/uL (ref 850–3900)
Absolute Monocytes: 626 {cells}/uL (ref 200–950)
Basophils Absolute: 70 {cells}/uL (ref 0–200)
Basophils Relative: 1.2 %
Eosinophils Absolute: 133 {cells}/uL (ref 15–500)
Eosinophils Relative: 2.3 %
HCT: 45.1 % (ref 35.9–46.0)
Hemoglobin: 15 g/dL (ref 11.7–15.5)
MCH: 30.5 pg (ref 27.0–33.0)
MCHC: 33.3 g/dL (ref 31.6–35.4)
MCV: 91.7 fL (ref 81.4–101.7)
MPV: 10.6 fL (ref 7.5–12.5)
Monocytes Relative: 10.8 %
Neutro Abs: 3057 {cells}/uL (ref 1500–7800)
Neutrophils Relative %: 52.7 %
Platelets: 283 10*3/uL (ref 140–400)
RBC: 4.92 Million/uL (ref 3.80–5.10)
RDW: 11.5 % (ref 11.0–15.0)
Total Lymphocyte: 33 %
WBC: 5.8 10*3/uL (ref 3.8–10.8)

## 2024-10-23 NOTE — Progress Notes (Signed)
 CBC and CMP WNL

## 2024-11-16 ENCOUNTER — Ambulatory Visit: Admitting: Physician Assistant
